# Patient Record
Sex: Female | Born: 1950 | Race: White | Hispanic: No | Marital: Married | Smoking: Never smoker
Health system: Southern US, Community
[De-identification: ages and names within clinical notes are randomized; demographics above are authoritative.]

## PROBLEM LIST (undated history)

## (undated) DIAGNOSIS — F329 Major depressive disorder, single episode, unspecified: Secondary | ICD-10-CM

## (undated) DIAGNOSIS — T7840XA Allergy, unspecified, initial encounter: Secondary | ICD-10-CM

## (undated) DIAGNOSIS — F418 Other specified anxiety disorders: Secondary | ICD-10-CM

## (undated) DIAGNOSIS — F411 Generalized anxiety disorder: Secondary | ICD-10-CM

## (undated) DIAGNOSIS — S02609A Fracture of mandible, unspecified, initial encounter for closed fracture: Secondary | ICD-10-CM

## (undated) DIAGNOSIS — Z0000 Encounter for general adult medical examination without abnormal findings: Secondary | ICD-10-CM

## (undated) DIAGNOSIS — I10 Essential (primary) hypertension: Secondary | ICD-10-CM

## (undated) DIAGNOSIS — D869 Sarcoidosis, unspecified: Secondary | ICD-10-CM

## (undated) DIAGNOSIS — F419 Anxiety disorder, unspecified: Secondary | ICD-10-CM

## (undated) DIAGNOSIS — F32A Depression, unspecified: Secondary | ICD-10-CM

## (undated) HISTORY — DX: Fracture of mandible, unspecified, initial encounter for closed fracture: S02.609A

## (undated) HISTORY — DX: Generalized anxiety disorder: F41.1

## (undated) HISTORY — DX: Allergy, unspecified, initial encounter: T78.40XA

## (undated) HISTORY — PX: ABDOMINAL HYSTERECTOMY: SHX81

## (undated) HISTORY — DX: Depression, unspecified: F32.A

## (undated) HISTORY — PX: FRACTURE SURGERY: SHX138

## (undated) HISTORY — DX: Sarcoidosis, unspecified: D86.9

## (undated) HISTORY — DX: Encounter for general adult medical examination without abnormal findings: Z00.00

## (undated) HISTORY — DX: Essential (primary) hypertension: I10

## (undated) HISTORY — DX: Anxiety disorder, unspecified: F41.9

## (undated) HISTORY — DX: Major depressive disorder, single episode, unspecified: F32.9

## (undated) HISTORY — DX: Other specified anxiety disorders: F41.8

---

## 1979-02-09 HISTORY — PX: ECTOPIC PREGNANCY SURGERY: SHX613

## 2005-10-26 ENCOUNTER — Encounter: Admission: RE | Admit: 2005-10-26 | Discharge: 2005-10-26 | Payer: Self-pay | Admitting: Unknown Physician Specialty

## 2006-10-11 ENCOUNTER — Encounter: Admission: RE | Admit: 2006-10-11 | Discharge: 2006-10-11 | Payer: Self-pay | Admitting: Unknown Physician Specialty

## 2008-08-30 ENCOUNTER — Encounter: Admission: RE | Admit: 2008-08-30 | Discharge: 2008-08-30 | Payer: Self-pay | Admitting: Unknown Physician Specialty

## 2009-09-03 ENCOUNTER — Encounter: Admission: RE | Admit: 2009-09-03 | Discharge: 2009-09-03 | Payer: Self-pay | Admitting: Unknown Physician Specialty

## 2009-09-09 ENCOUNTER — Encounter: Admission: RE | Admit: 2009-09-09 | Discharge: 2009-09-09 | Payer: Self-pay | Admitting: Unknown Physician Specialty

## 2010-03-01 ENCOUNTER — Encounter: Payer: Self-pay | Admitting: Unknown Physician Specialty

## 2010-09-01 ENCOUNTER — Other Ambulatory Visit (HOSPITAL_BASED_OUTPATIENT_CLINIC_OR_DEPARTMENT_OTHER): Payer: Self-pay | Admitting: Unknown Physician Specialty

## 2010-09-01 DIAGNOSIS — Z1231 Encounter for screening mammogram for malignant neoplasm of breast: Secondary | ICD-10-CM

## 2010-09-07 ENCOUNTER — Ambulatory Visit (HOSPITAL_BASED_OUTPATIENT_CLINIC_OR_DEPARTMENT_OTHER)
Admission: RE | Admit: 2010-09-07 | Discharge: 2010-09-07 | Disposition: A | Discharge status: Home / Self Care | Payer: BC Managed Care – PPO | Source: Ambulatory Visit | Attending: Unknown Physician Specialty | Admitting: Unknown Physician Specialty

## 2010-09-07 DIAGNOSIS — Z1231 Encounter for screening mammogram for malignant neoplasm of breast: Secondary | ICD-10-CM | POA: Insufficient documentation

## 2010-10-05 ENCOUNTER — Encounter (HOSPITAL_BASED_OUTPATIENT_CLINIC_OR_DEPARTMENT_OTHER): Payer: Self-pay

## 2010-10-05 ENCOUNTER — Ambulatory Visit (HOSPITAL_BASED_OUTPATIENT_CLINIC_OR_DEPARTMENT_OTHER)
Admission: RE | Admit: 2010-10-05 | Discharge: 2010-10-05 | Disposition: A | Discharge status: Home / Self Care | Payer: BC Managed Care – PPO | Source: Ambulatory Visit | Attending: Family | Admitting: Family

## 2010-10-05 ENCOUNTER — Ambulatory Visit (INDEPENDENT_AMBULATORY_CARE_PROVIDER_SITE_OTHER): Payer: BC Managed Care – PPO | Admitting: Family

## 2010-10-05 ENCOUNTER — Encounter: Payer: Self-pay | Admitting: Family

## 2010-10-05 VITALS — BP 138/88 | HR 66 | Temp 98.2°F | Resp 16 | Ht 64.0 in | Wt 164.1 lb

## 2010-10-05 DIAGNOSIS — K5730 Diverticulosis of large intestine without perforation or abscess without bleeding: Secondary | ICD-10-CM | POA: Insufficient documentation

## 2010-10-05 DIAGNOSIS — R109 Unspecified abdominal pain: Secondary | ICD-10-CM | POA: Insufficient documentation

## 2010-10-05 DIAGNOSIS — R197 Diarrhea, unspecified: Secondary | ICD-10-CM | POA: Insufficient documentation

## 2010-10-05 DIAGNOSIS — K449 Diaphragmatic hernia without obstruction or gangrene: Secondary | ICD-10-CM | POA: Insufficient documentation

## 2010-10-05 LAB — CBC WITH DIFFERENTIAL/PLATELET
Basophils Relative: 1 % (ref 0–1)
Eosinophils Absolute: 1.5 10*3/uL — ABNORMAL HIGH (ref 0.0–0.7)
Eosinophils Relative: 15 % — ABNORMAL HIGH (ref 0–5)
Lymphocytes Relative: 20 % (ref 12–46)
MCH: 32.1 pg (ref 26.0–34.0)
MCHC: 32.6 g/dL (ref 30.0–36.0)
MCV: 98.2 fL (ref 78.0–100.0)
Neutrophils Relative %: 60 % (ref 43–77)
Platelets: 205 10*3/uL (ref 150–400)
RBC: 4.46 MIL/uL (ref 3.87–5.11)
RDW: 13 % (ref 11.5–15.5)
WBC: 10.1 10*3/uL (ref 4.0–10.5)

## 2010-10-05 LAB — HEPATIC FUNCTION PANEL
AST: 27 U/L (ref 0–37)
Bilirubin, Direct: 0.1 mg/dL (ref 0.0–0.3)
Indirect Bilirubin: 0.4 mg/dL (ref 0.0–0.9)

## 2010-10-05 MED ORDER — DIPHENOXYLATE-ATROPINE 2.5-0.025 MG PO TABS: 1.0000 | ORAL_TABLET | Freq: Four times a day (QID) | ORAL | Status: DC | PRN

## 2010-10-05 MED ORDER — IOHEXOL 300 MG/ML  SOLN
100.0000 mL | Freq: Once | INTRAMUSCULAR | Status: AC | PRN
  Administered 2010-10-05: 100 mL via INTRAVENOUS

## 2010-10-05 NOTE — Progress Notes (Signed)
Subjective:    Patient ID: Casey Rowe, female    DOB: [birth year], 60 y.o.   MRN: [id number]  HPI  Casey Rowe is a 60 yr old female who presents today with chief complaint of diarrhea. Started in July- stopped dairy without improvement.  Symptoms continued to worsen.  + Associated bloating, often wakes her up at night.  Started probiotic, yogurt, no improvement.  Yesterday had 12 episodes of diarrhea yesterday. Diarrhea is associated with abdominal cramping.  Denies fever. Denies black or bloody stools.  Denies history of antibiotic use. She reports that she had appendicitis 8 yrs ago, she declined removal of appendix at that time. Denies recent travel.    She sees the Urgent care ([facility] Wishes to establish with a primary care.    Review of Systems  Constitutional: Negative for fever.  HENT: Negative for congestion.   Respiratory: Negative for cough and shortness of breath.   Cardiovascular: Negative for chest pain.  Gastrointestinal: Positive for nausea.  Genitourinary: Negative for dysuria and urgency.  Musculoskeletal: Negative for myalgias and arthralgias.  Skin: Negative for rash.  Neurological: Negative for headaches.  Hematological: Negative for adenopathy.  Psychiatric/Behavioral:       Denies concerns about depression/anxiety   Past Medical History  Diagnosis Date  . Broken jaw 1980s  . Tubal pregnancy 1981  . Hypertension   . Sarcoidosis     History   Social History  . Marital Status: Married    Spouse Name: N/A    Number of Children: N/A  . Years of Education: N/A   Occupational History  . Not on file.   Social History Main Topics  . Smoking status: Never Smoker   . Smokeless tobacco: Never Used  . Alcohol Use: Yes  . Drug Use: No  . Sexually Active: Not on file   Other Topics Concern  . 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  Fracture surgery 1980s    broken jaw  . Ectopic pregnancy surgery 1981    Family History  Problem Relation Age of Onset  . Hypertension Mother   . Dementia Mother   . Diabetes Mother   . Diabetes Father   . Heart attack Father   . Heart disease Father   . Cancer Maternal Aunt     breast    Allergies  Allergen Reactions  . Demerol Nausea And Vomiting    No current outpatient prescriptions on file prior to visit.   No current facility-administered medications on file prior to visit.    BP 138/88  Pulse 66  Temp(Src) 98.2 F (36.8 C) (Oral)  Resp 16  Ht 5\' 4"  (1.626 m)  Wt 164 lb 1.3 oz (74.426 kg)  BMI 28.16 kg/m2  LMP 02/08/1997        Objective:   Physical Exam  Constitutional: She is oriented to person, place, and time. She appears well-developed and well-nourished.  HENT:  Head: Normocephalic and atraumatic.  Eyes: Conjunctivae are normal. No scleral icterus.  Neck: Neck supple.  Cardiovascular: Normal rate and regular rhythm.   Pulmonary/Chest: Effort normal and breath sounds normal. No respiratory distress. She has no wheezes. She has no rales. She exhibits no tenderness.  Abdominal: Soft. Bowel sounds are normal. She exhibits no distension and no mass. There is no rebound and no guarding.  Mild RLQ and LLQ tenderness without guarding  Neurological: She is alert and oriented to person, place, and time.  Skin: Skin is warm and dry.  Psychiatric: She has a normal mood and affect. Her behavior is normal. Judgment and thought content normal.          Assessment & Plan:

## 2010-10-05 NOTE — Patient Instructions (Signed)
Drink plenty of fluids. Complete your CT and lab work on the first floor.  Follow up in 1 week, call if you develop worsening abdominal pain, nausea, vomitting, fever, or blood in the stool.  Welcome to [facility]!

## 2010-10-06 ENCOUNTER — Telehealth: Payer: Self-pay | Admitting: *Deleted

## 2010-10-06 ENCOUNTER — Other Ambulatory Visit: Payer: Self-pay | Admitting: Family

## 2010-10-06 DIAGNOSIS — R197 Diarrhea, unspecified: Secondary | ICD-10-CM

## 2010-10-06 LAB — BASIC METABOLIC PANEL WITH GFR
BUN: 7 mg/dL (ref 6–23)
Calcium: 9.7 mg/dL (ref 8.4–10.5)
Creat: 0.74 mg/dL (ref 0.50–1.10)
GFR, Est African American: >60 mL/min (ref >60)
GFR, Est Non African American: >60 mL/min (ref >60)

## 2010-10-06 MED ORDER — METRONIDAZOLE 500 MG PO TABS: 500.0000 mg | ORAL_TABLET | Freq: Three times a day (TID) | ORAL | Status: AC

## 2010-10-06 MED ORDER — CIPROFLOXACIN HCL 500 MG PO TABS: 500.0000 mg | ORAL_TABLET | Freq: Two times a day (BID) | ORAL | Status: AC

## 2010-10-06 NOTE — Assessment & Plan Note (Signed)
CT performed notes- Mild nonspecific pelvic edema and trace cul-de-sac fluid. Given concurrent sigmoid diverticulosis, question mild uncomplicated diverticulitis. Lab work is unremarkable.  Will plan to add empiric cipro/flagyl, complete stool studies and refer to GI.  See phone note 8/28.  Immodium PRN.

## 2010-10-06 NOTE — Telephone Encounter (Signed)
Received call from pt that she had a bad night. Tried to eat some chicken last night and was very sick. She completed the CT and stool cultures. Pt upset; "feels like we were just treating her symptoms instead of the problem." Feels that the anti-diarrheal is not helping her problems. Advised pt that the CT and stool cultures were ordered to help determine the cause of her symptoms so we will know how and what we are treating. Advised pt we would call her back with results and directions. 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 Cell) [phone].  Please advise.

## 2010-10-06 NOTE — Telephone Encounter (Signed)
Spoke with pt reviewed results of the CT and plans to treat empirically with cipro and flagyl.  Await stool studies, will also refer to GI.  Pt encouraged to stay well hydrated and let us know how she feels in the next 1-2 days.  She verbalizes understanding.

## 2010-10-07 LAB — CLOSTRIDIUM DIFFICILE BY PCR: Toxigenic C. Difficile by PCR: NOT DETECTED

## 2010-10-08 ENCOUNTER — Ambulatory Visit: Payer: BC Managed Care – PPO | Admitting: Internal Medicine

## 2010-10-10 LAB — STOOL CULTURE

## 2010-10-12 ENCOUNTER — Encounter: Payer: Self-pay | Admitting: Family

## 2011-01-16 ENCOUNTER — Encounter: Payer: Self-pay | Admitting: Family Medicine

## 2011-01-16 ENCOUNTER — Ambulatory Visit (INDEPENDENT_AMBULATORY_CARE_PROVIDER_SITE_OTHER): Payer: BC Managed Care – PPO | Admitting: Family Medicine

## 2011-01-16 VITALS — BP 142/88 | HR 81 | Temp 98.0°F | Wt 166.0 lb

## 2011-01-16 DIAGNOSIS — J309 Allergic rhinitis, unspecified: Secondary | ICD-10-CM

## 2011-01-16 MED ORDER — AMOXICILLIN-POT CLAVULANATE 875-125 MG PO TABS: 1.0000 | ORAL_TABLET | Freq: Two times a day (BID) | ORAL | Status: AC

## 2011-01-16 MED ORDER — METHYLPREDNISOLONE ACETATE PF 40 MG/ML IJ SUSP
40.0000 mg | Freq: Once | INTRAMUSCULAR | Status: AC
  Administered 2011-01-16: 40 mg via INTRAMUSCULAR

## 2011-01-16 MED ORDER — FLUTICASONE PROPIONATE 50 MCG/ACT NA SUSP: 2.0000 | Freq: Every day | NASAL | Status: DC

## 2011-01-16 MED ORDER — FLUTICASONE FUROATE 27.5 MCG/SPRAY NA SUSP: NASAL | Status: DC

## 2011-01-16 NOTE — Progress Notes (Signed)
OFFICE NOTE  01/18/2011  CC:  Chief Complaint  Patient presents with  . Sinusitis    x 2 mth, worse this week     HPI:   Patient is a 60 y.o. Caucasian female who is here for respiratory complaints. "Head cold from hell".  Two months of waxing/waning nasal congestion/facial fullness, PND, feels puffy/hot in paranasal sinus areas.  NO cough.  NO fever. +HA.  Sticky, yellow mucous.  Feels like she's worsening and is afraid it is going to "go to her chest like last time" and cause bronchitis sx's.  Pertinent PMH:  HTN Depression  MEDS;   Outpatient Prescriptions Prior to Visit  Medication Sig Dispense Refill  . bisoprolol (ZEBETA) 5 MG tablet Take 5 mg by mouth daily.        . BuPROPion HCl (WELLBUTRIN XL PO) Take by mouth.        . Est Estrogens-Methyltest (ESTRATEST PO) Take by mouth.        . losartan (COZAAR) 50 MG tablet Take 50 mg by mouth 2 (two) times daily.        Marland Kitchen lactase (LACTAID) 3000 UNITS tablet Take 1 tablet by mouth 3 (three) times daily with meals.        . Loperamide HCl (ANTI-DIARRHEAL PO) Take by mouth 3 (three) times daily as needed.        . Probiotic Product (PROBIOTIC FORMULA) CAPS Take 1 capsule by mouth daily.        . Simethicone 125 MG TABS Take by mouth as needed.          PE: Blood pressure 142/88, pulse 81, temperature 98 F (36.7 C), temperature source Oral, weight 166 lb (75.297 kg), last menstrual period 02/08/1997, SpO2 97.00%. VS: noted--normal. Gen: alert, NAD, NONTOXIC APPEARING. HEENT: eyes without injection, drainage, or swelling.  Ears: EACs clear, TMs with normal light reflex and landmarks.  Nose: Clear rhinorrhea, with some dried, crusty exudate adherent to mildly injected mucosa.  No purulent d/c.  No paranasal sinus TTP.  No facial swelling.  Throat and mouth without focal lesion.  No pharyngial swelling, erythema, or exudate.   Neck: supple, no LAD.   LUNGS: CTA bilat, nonlabored resps.   CV: RRR, no m/r/g. EXT: no c/c/e SKIN:  no rash  IMPRESSION AND PLAN:  Allergic sinusitis + infectious component as well. No sign of RAD at this point but pt fearful it is about to "go there". Depo Medrol 40mg  IM given today x 1. Flonase 2 sprays each nostril once daily.  Continue saline nasal irrigation. Augmentin 875mg  bid x 10d.     FOLLOW UP:  Return if symptoms worsen or fail to improve.

## 2011-01-16 NOTE — Patient Instructions (Signed)
Try OTC nonsedating antihistamine like generic allegra OR generic claritin OR generic zyrtec and take as directed. Avoid decongestants--these may elevate your blood pressure.

## 2011-01-18 DIAGNOSIS — J329 Chronic sinusitis, unspecified: Secondary | ICD-10-CM | POA: Insufficient documentation

## 2011-01-18 HISTORY — DX: Chronic sinusitis, unspecified: J32.9

## 2011-01-18 NOTE — Assessment & Plan Note (Signed)
+   infectious component as well. No sign of RAD at this point but pt fearful it is about to "go there". Depo Medrol 40mg  IM given today x 1. Flonase 2 sprays each nostril once daily.  Continue saline nasal irrigation. Augmentin 875mg  bid x 10d.

## 2011-02-04 ENCOUNTER — Encounter: Payer: Self-pay | Admitting: Internal Medicine

## 2011-02-04 ENCOUNTER — Ambulatory Visit (INDEPENDENT_AMBULATORY_CARE_PROVIDER_SITE_OTHER): Payer: BC Managed Care – PPO | Admitting: Internal Medicine

## 2011-02-04 DIAGNOSIS — I10 Essential (primary) hypertension: Secondary | ICD-10-CM

## 2011-02-04 DIAGNOSIS — F411 Generalized anxiety disorder: Secondary | ICD-10-CM | POA: Insufficient documentation

## 2011-02-04 DIAGNOSIS — F418 Other specified anxiety disorders: Secondary | ICD-10-CM | POA: Insufficient documentation

## 2011-02-04 DIAGNOSIS — D869 Sarcoidosis, unspecified: Secondary | ICD-10-CM | POA: Insufficient documentation

## 2011-02-04 DIAGNOSIS — F419 Anxiety disorder, unspecified: Secondary | ICD-10-CM

## 2011-02-04 DIAGNOSIS — R079 Chest pain, unspecified: Secondary | ICD-10-CM

## 2011-02-04 DIAGNOSIS — T7840XA Allergy, unspecified, initial encounter: Secondary | ICD-10-CM | POA: Insufficient documentation

## 2011-02-04 DIAGNOSIS — J45909 Unspecified asthma, uncomplicated: Secondary | ICD-10-CM | POA: Insufficient documentation

## 2011-02-04 MED ORDER — LOSARTAN POTASSIUM 50 MG PO TABS: 50.0000 mg | ORAL_TABLET | Freq: Two times a day (BID) | ORAL | Status: DC

## 2011-02-04 MED ORDER — BISOPROLOL FUMARATE 5 MG PO TABS: 5.0000 mg | ORAL_TABLET | Freq: Every day | ORAL | Status: DC

## 2011-02-04 MED ORDER — BUPROPION HCL ER (XL) 300 MG PO TB24: 300.0000 mg | ORAL_TABLET | Freq: Every day | ORAL | Status: DC

## 2011-02-04 MED ORDER — LORAZEPAM 0.5 MG PO TABS: 0.5000 mg | ORAL_TABLET | Freq: Two times a day (BID) | ORAL | Status: AC | PRN

## 2011-02-04 NOTE — Patient Instructions (Signed)
Take Ativan twice a day as needed.  May use at night if cannot sleep  See me in early January     Keep appt with cardioloigst tomorrow  Dr. Emelda Brothers cardiology  Labs will be mailed to you
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
## 2011-02-05 ENCOUNTER — Encounter: Payer: Self-pay | Admitting: Emergency Medicine

## 2011-02-05 LAB — TSH: TSH: 0.926 u[IU]/mL (ref 0.350–4.500)

## 2011-02-05 LAB — COMPREHENSIVE METABOLIC PANEL
ALT: 19 U/L (ref 0–35)
AST: 17 U/L (ref 0–37)
Albumin: 4.6 g/dL (ref 3.5–5.2)
CO2: 25 mEq/L (ref 19–32)
Calcium: 9.7 mg/dL (ref 8.4–10.5)
Chloride: 104 mEq/L (ref 96–112)
Creat: 0.66 mg/dL (ref 0.50–1.10)
Potassium: 4.3 mEq/L (ref 3.5–5.3)
Sodium: 138 mEq/L (ref 135–145)
Total Protein: 6.8 g/dL (ref 6.0–8.3)

## 2011-02-05 LAB — CBC WITH DIFFERENTIAL/PLATELET
Eosinophils Relative: 3 % (ref 0–5)
Lymphocytes Relative: 30 % (ref 12–46)
Lymphs Abs: 1.6 10*3/uL (ref 0.7–4.0)
MCV: 100 fL (ref 78.0–100.0)
Neutro Abs: 3 10*3/uL (ref 1.7–7.7)
Platelets: 219 10*3/uL (ref 150–400)
RBC: 4.57 MIL/uL (ref 3.87–5.11)
WBC: 5.2 10*3/uL (ref 4.0–10.5)

## 2011-02-05 LAB — LIPID PANEL: LDL Cholesterol: 130 mg/dL — ABNORMAL HIGH (ref 0–99)

## 2011-02-10 ENCOUNTER — Ambulatory Visit (INDEPENDENT_AMBULATORY_CARE_PROVIDER_SITE_OTHER): Payer: BC Managed Care – PPO | Admitting: Internal Medicine

## 2011-02-10 ENCOUNTER — Encounter: Payer: Self-pay | Admitting: Internal Medicine

## 2011-02-10 VITALS — BP 139/84 | HR 76 | Temp 97.2°F | Ht 64.5 in | Wt 160.0 lb

## 2011-02-10 DIAGNOSIS — F419 Anxiety disorder, unspecified: Secondary | ICD-10-CM

## 2011-02-10 DIAGNOSIS — I10 Essential (primary) hypertension: Secondary | ICD-10-CM

## 2011-02-10 DIAGNOSIS — R0789 Other chest pain: Secondary | ICD-10-CM | POA: Insufficient documentation

## 2011-02-10 DIAGNOSIS — F411 Generalized anxiety disorder: Secondary | ICD-10-CM

## 2011-02-10 NOTE — Progress Notes (Signed)
Subjective:    Patient ID: Casey Rowe, female    DOB: [birth year], 62 y.o.   MRN: [id number]  HPI Jaimey returns for follow up.  She is slightly improved but is afraid to take her Ativan.  She has only take 3 times.  She reports she does feel better during the day when she takes it.  She has a stress ECHO for tomorrow.  She returns to school on Thursday and has quite a bit of anxiety about returning to the classroom  She also reports her 2 yo grandson has a brain tumor.   Lives in [place] and she feels bad that she cannot get to her daughter  Allergies  Allergen Reactions  . Demerol Nausea And Vomiting   Past Medical History  Diagnosis Date  . Broken jaw 1980s  . Tubal pregnancy 1981  . Sarcoidosis   . Anxiety   . Depression   . Hypertension   . Asthma   . Allergy    Past Surgical History  Procedure Date  . Fracture surgery 1980s    broken jaw  . Ectopic pregnancy surgery 1981  . Cesarean section   . Abdominal hysterectomy    History   Social History  . Marital Status: Married    Spouse Name: N/A    Number of Children: N/A  . Years of Education: N/A   Occupational History  . Not on file.   Social History Main Topics  . Smoking status: Never Smoker   . Smokeless tobacco: Never Used  . Alcohol Use: Yes  . Drug Use: No  . Sexually Active: Not on file   Other Topics Concern  . 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 Hypertension Mother   . Dementia Mother   . Diabetes Mother   . Diabetes Father   . Heart attack Father   . Heart disease Father   . Psoriasis Father   . Cancer Maternal Aunt     breast   Patient Active Problem List  Diagnoses  . Diarrhea  . Allergic sinusitis  . Anxiety  . Depression  . Hypertension  . Asthma  . Sarcoidosis  . Allergy   Current Outpatient Prescriptions on File Prior to Visit  Medication Sig  Dispense Refill  . bisoprolol (ZEBETA) 5 MG tablet Take 1 tablet (5 mg total) by mouth daily.  90 tablet  1  . buPROPion (WELLBUTRIN XL) 300 MG 24 hr tablet Take 1 tablet (300 mg total) by mouth daily.  90 tablet  1  . estrogen-methylTESTOSTERone (ESTRATEST) 1.25-2.5 MG per tablet Take 1 tablet by mouth daily.        . fluticasone (FLONASE) 50 MCG/ACT nasal spray Place 2 sprays into the nose daily.  16 g  5  . LORazepam (ATIVAN) 0.5 MG tablet Take 1 tablet (0.5 mg total) by mouth 2 (two) times daily as needed for anxiety.  30 tablet  1  . losartan (COZAAR) 50 MG tablet Take 1 tablet (50 mg total) by mouth 2 (two) times daily.  180 tablet  0  . losartan (COZAAR) 50 MG tablet Take 1 tablet (50 mg total) by mouth 2 (two) times daily.  180 tablet  0       Review of Systems    see HPI Objective:   Physical Exam  Physical Exam  Nursing note and vitals  reviewed.  Constitutional: She is oriented to person, place, and time. She appears well-developed and well-nourished.  HENT:  Head: Normocephalic and atraumatic.  Cardiovascular: Normal rate and regular rhythm. Exam reveals no gallop and no friction rub.  No murmur heard.  Pulmonary/Chest: Breath sounds normal. She has no wheezes. She has no rales.  Neurological: She is alert and oriented to person, place, and time.  Skin: Skin is warm and dry.  Psychiatric: She has a normal mood and affect. Her behavior is normal.      Assessment & Plan:  1)   Anxiety  Again reinforced to take her medicine bid and before 1:30 pm at night.  She voices understanding.  I gave her the phone number for 3 different psychiatrists and pt voices that she will make appt. With one  2)  Atypical  Chest pressure:  Work up pending.    See me in 4 weeks or sooner prn

## 2011-02-10 NOTE — Patient Instructions (Signed)
Take anxiety medicine twice a day   See me in 4 weeks

## 2011-02-22 ENCOUNTER — Ambulatory Visit (INDEPENDENT_AMBULATORY_CARE_PROVIDER_SITE_OTHER): Payer: BC Managed Care – PPO | Admitting: Emergency Medicine

## 2011-02-22 VITALS — BP 141/87 | HR 69 | Temp 97.1°F | Resp 12 | Wt 162.0 lb

## 2011-02-22 DIAGNOSIS — Z23 Encounter for immunization: Secondary | ICD-10-CM

## 2011-05-12 ENCOUNTER — Encounter: Payer: Self-pay | Admitting: Internal Medicine

## 2011-05-12 ENCOUNTER — Ambulatory Visit (INDEPENDENT_AMBULATORY_CARE_PROVIDER_SITE_OTHER): Payer: BC Managed Care – PPO | Admitting: Internal Medicine

## 2011-05-12 DIAGNOSIS — J9801 Acute bronchospasm: Secondary | ICD-10-CM

## 2011-05-12 DIAGNOSIS — R05 Cough: Secondary | ICD-10-CM

## 2011-05-12 DIAGNOSIS — J45909 Unspecified asthma, uncomplicated: Secondary | ICD-10-CM

## 2011-05-12 MED ORDER — METHYLPREDNISOLONE ACETATE 80 MG/ML IJ SUSP
120.0000 mg | Freq: Once | INTRAMUSCULAR | Status: AC
  Administered 2011-05-12: 120 mg via INTRAMUSCULAR

## 2011-05-12 MED ORDER — ALBUTEROL SULFATE HFA 108 (90 BASE) MCG/ACT IN AERS: 2.0000 | INHALATION_SPRAY | Freq: Four times a day (QID) | RESPIRATORY_TRACT | Status: DC | PRN

## 2011-05-12 MED ORDER — FLUTICASONE-SALMETEROL 250-50 MCG/DOSE IN AEPB: INHALATION_SPRAY | RESPIRATORY_TRACT | Status: DC

## 2011-05-12 MED ORDER — PREDNISONE 20 MG PO TABS: ORAL_TABLET | ORAL | Status: DC

## 2011-05-12 NOTE — Patient Instructions (Signed)
See me in one week   Take meds as prescribed

## 2011-05-12 NOTE — Progress Notes (Signed)
Subjective:    Patient ID: Casey Rowe, female    DOB: April 28, 1950, 61 y.o.   MRN: [id number]  HPI  Casey Rowe is here for acute visit  She has been wheezing last several days and using her home nebulizer frequently.  She has not had her Qvar in a while.  Lots of coughing.  Started Zyrtec for her allergies.    Allergies  Allergen Reactions  . Demerol Nausea And Vomiting   Past Medical History  Diagnosis Date  . Broken jaw 1980s  . Tubal pregnancy 1981  . Sarcoidosis   . Anxiety   . Depression   . Hypertension   . Asthma   . Allergy    Past Surgical History  Procedure Date  . Fracture surgery 1980s    broken jaw  . Ectopic pregnancy surgery 1981  . Cesarean section   . Abdominal hysterectomy    History   Social History  . Marital Status: Married    Spouse Name: N/A    Number of Children: N/A  . Years of Education: N/A   Occupational History  . Not on file.   Social History Main Topics  . Smoking status: Never Smoker   . Smokeless tobacco: Never Used  . Alcohol Use: Yes  . Drug Use: No  . Sexually Active: Not on file   Other Topics Concern  . 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 Hypertension Mother   . Dementia Mother   . Diabetes Mother   . Diabetes Father   . Heart attack Father   . Heart disease Father   . Psoriasis Father   . Cancer Maternal Aunt     breast   Patient Active Problem List  Diagnoses  . Diarrhea  . Allergic sinusitis  . Anxiety  . Depression  . Hypertension  . Asthma  . Sarcoidosis  . Allergy  . Atypical chest pain   Current Outpatient Prescriptions on File Prior to Visit  Medication Sig Dispense Refill  . beclomethasone (QVAR) 80 MCG/ACT inhaler Inhale 1 puff into the lungs 2 (two) times daily.      . bisoprolol (ZEBETA) 5 MG tablet Take 1 tablet (5 mg total) by mouth daily.  90 tablet  1  . buPROPion  (WELLBUTRIN XL) 300 MG 24 hr tablet Take 1 tablet (300 mg total) by mouth daily.  90 tablet  1  . estrogen-methylTESTOSTERone (ESTRATEST) 1.25-2.5 MG per tablet Take 1 tablet by mouth daily.        . fluticasone (FLONASE) 50 MCG/ACT nasal spray Place 2 sprays into the nose daily.  16 g  5  . losartan (COZAAR) 50 MG tablet Take 1 tablet (50 mg total) by mouth 2 (two) times daily.  180 tablet  0  . albuterol (PROVENTIL HFA;VENTOLIN HFA) 108 (90 BASE) MCG/ACT inhaler Inhale 2 puffs into the lungs every 6 (six) hours as needed for wheezing.  1 Inhaler  0  . Fluticasone-Salmeterol (ADVAIR DISKUS) 250-50 MCG/DOSE AEPB 1 puff into the lungs twice a day  60 each  0  . DISCONTD: losartan (COZAAR) 50 MG tablet Take 1 tablet (50 mg total) by mouth 2 (two) times daily.  180 tablet  0   No current facility-administered medications on file prior to visit.      Review of Systems See HPI    Objective:  Physical Exam  Physical Exam  Nursing note and vitals reviewed.  Constitutional: She is oriented to person, place, and time. She appears well-developed and well-nourished.  HENT:  Head: Normocephalic and atraumatic.  Cardiovascular: Normal rate and regular rhythm. Exam reveals no gallop and no friction rub.  No murmur heard.  Pulmonary/Chest: Breath sounds normal. She has end exp wheezing. She has no rales.  Neurological: She is alert and oriented to person, place, and time.  Skin: Skin is warm and dry.  Psychiatric: She has a normal mood and affect. Her behavior is normal.        Assessment & Plan:  1) asthma exacerbation.  Will give depo-medrol 120 mg in office and prednisone 60 mg taper by 20 mg q 3days.  Add advair 200/50 and recheck in one week.  Ok to switch to albuterol MDI for rescue only.   2) cough see above.  3)  Bronchospasm

## 2011-08-07 ENCOUNTER — Other Ambulatory Visit: Payer: Self-pay | Admitting: Internal Medicine

## 2011-08-20 ENCOUNTER — Other Ambulatory Visit: Payer: Self-pay | Admitting: Internal Medicine

## 2011-08-30 ENCOUNTER — Other Ambulatory Visit (HOSPITAL_BASED_OUTPATIENT_CLINIC_OR_DEPARTMENT_OTHER): Payer: Self-pay | Admitting: Unknown Physician Specialty

## 2011-08-30 DIAGNOSIS — Z1231 Encounter for screening mammogram for malignant neoplasm of breast: Secondary | ICD-10-CM

## 2011-09-02 ENCOUNTER — Encounter: Payer: Self-pay | Admitting: Internal Medicine

## 2011-09-02 ENCOUNTER — Ambulatory Visit (INDEPENDENT_AMBULATORY_CARE_PROVIDER_SITE_OTHER): Payer: BC Managed Care – PPO | Admitting: Internal Medicine

## 2011-09-02 VITALS — BP 134/80 | HR 66 | Temp 97.0°F | Resp 16 | Ht 64.5 in | Wt 159.0 lb

## 2011-09-02 DIAGNOSIS — F329 Major depressive disorder, single episode, unspecified: Secondary | ICD-10-CM

## 2011-09-02 DIAGNOSIS — F3289 Other specified depressive episodes: Secondary | ICD-10-CM

## 2011-09-02 DIAGNOSIS — F419 Anxiety disorder, unspecified: Secondary | ICD-10-CM

## 2011-09-02 DIAGNOSIS — F411 Generalized anxiety disorder: Secondary | ICD-10-CM

## 2011-09-02 DIAGNOSIS — I10 Essential (primary) hypertension: Secondary | ICD-10-CM

## 2011-09-02 DIAGNOSIS — E785 Hyperlipidemia, unspecified: Secondary | ICD-10-CM

## 2011-09-02 LAB — COMPREHENSIVE METABOLIC PANEL
Albumin: 4.2 g/dL (ref 3.5–5.2)
Alkaline Phosphatase: 36 U/L — ABNORMAL LOW (ref 39–117)
BUN: 12 mg/dL (ref 6–23)
CO2: 28 mEq/L (ref 19–32)
Glucose, Bld: 72 mg/dL (ref 70–99)
Total Bilirubin: 0.5 mg/dL (ref 0.3–1.2)
Total Protein: 6.6 g/dL (ref 6.0–8.3)

## 2011-09-02 LAB — LIPID PANEL
Cholesterol: 190 mg/dL (ref 0–200)
Total CHOL/HDL Ratio: 5.6 Ratio
VLDL: 49 mg/dL — ABNORMAL HIGH (ref 0–40)

## 2011-09-02 MED ORDER — LOSARTAN POTASSIUM 50 MG PO TABS: 50.0000 mg | ORAL_TABLET | Freq: Two times a day (BID) | ORAL | Status: DC

## 2011-09-02 MED ORDER — BISOPROLOL FUMARATE 5 MG PO TABS: 5.0000 mg | ORAL_TABLET | Freq: Every day | ORAL | Status: DC

## 2011-09-02 NOTE — Progress Notes (Signed)
Subjective:    Patient ID: Casey Rowe, female    DOB: 1950-07-30, 61 y.o.   MRN: [id number]  HPI Casey Rowe is here for follow up.  She is smiling, doing much better.  She is seeing a psychiatrist Dr. Cora Collum and therapist Colen Darling.  Uses Xanax most evenings.  Breathing better no wheezing.    She is fasting and would like her lipids rechecked  No futher epoisodes of chest pain  Allergies  Allergen Reactions  . Demerol Nausea And Vomiting   Past Medical History  Diagnosis Date  . Broken jaw 1980s  . Tubal pregnancy 1981  . Sarcoidosis   . Anxiety   . Depression   . Hypertension   . Asthma   . Allergy    Past Surgical History  Procedure Date  . Fracture surgery 1980s    broken jaw  . Ectopic pregnancy surgery 1981  . Cesarean section   . Abdominal hysterectomy    History   Social History  . Marital Status: Married    Spouse Name: N/A    Number of Children: N/A  . Years of Education: N/A   Occupational History  . Not on file.   Social History Main Topics  . Smoking status: Never Smoker   . Smokeless tobacco: Never Used  . Alcohol Use: Yes  . Drug Use: No  . Sexually Active: Not on file   Other Topics Concern  . 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 Hypertension Mother   . Dementia Mother   . Diabetes Mother   . Diabetes Father   . Heart attack Father   . Heart disease Father   . Psoriasis Father   . Cancer Maternal Aunt     breast   Patient Active Problem List  Diagnosis  . Diarrhea  . Allergic sinusitis  . Anxiety  . Depression  . Hypertension  . Asthma  . Sarcoidosis  . Allergy  . Atypical chest pain   Current Outpatient Prescriptions on File Prior to Visit  Medication Sig Dispense Refill  . albuterol (PROVENTIL HFA;VENTOLIN HFA) 108 (90 BASE) MCG/ACT inhaler Inhale 2 puffs into the lungs every 6 (six) hours  as needed for wheezing.  1 Inhaler  0  . ALPRAZolam (XANAX) 0.25 MG tablet Take 0.25 mg by mouth 3 (three) times daily as needed.      . beclomethasone (QVAR) 80 MCG/ACT inhaler Inhale 1 puff into the lungs 2 (two) times daily.      Marland Kitchen buPROPion (WELLBUTRIN XL) 300 MG 24 hr tablet Take 1 tablet (300 mg total) by mouth daily.  90 tablet  1  . estrogen-methylTESTOSTERone (ESTRATEST) 1.25-2.5 MG per tablet Take 1 tablet by mouth daily.        . fluticasone (FLONASE) 50 MCG/ACT nasal spray Place 2 sprays into the nose daily.  16 g  5  . Fluticasone-Salmeterol (ADVAIR DISKUS) 250-50 MCG/DOSE AEPB 1 puff into the lungs twice a day  60 each  0  . DISCONTD: bisoprolol (ZEBETA) 5 MG tablet TAKE 1 TABLET (5 MG TOTAL) BY MOUTH DAILY.  90 tablet  1  . DISCONTD: losartan (COZAAR) 50 MG tablet TAKE 1 TABLET (50 MG TOTAL) BY MOUTH 2 (TWO) TIMES DAILY.  180 tablet  1       Review of Systems    see Hpi Objective:  Physical Exam Physical Exam  Nursing note and vitals reviewed.  Constitutional: She is oriented to person, place, and time. She appears well-developed and well-nourished.  HENT:  Head: Normocephalic and atraumatic.  Cardiovascular: Normal rate and regular rhythm. Exam reveals no gallop and no friction rub.  No murmur heard.  Pulmonary/Chest: Breath sounds normal. She has no wheezes. She has no rales.  Neurological: She is alert and oriented to person, place, and time.  Skin: Skin is warm and dry.  Psychiatric: She has a normal mood and affect. Her behavior is normal.              Assessment & Plan:  Anxiety/depression  Continue Xanax and Wellbutrin.  See therapist and psychiatrist  HTN  Reo-rdered Cozaar and Zebeta  Asthma  controlled

## 2011-09-02 NOTE — Patient Instructions (Addendum)
Labs will be mailed to you  See me as needed 

## 2011-09-08 ENCOUNTER — Ambulatory Visit (HOSPITAL_BASED_OUTPATIENT_CLINIC_OR_DEPARTMENT_OTHER)
Admission: RE | Admit: 2011-09-08 | Discharge: 2011-09-08 | Disposition: A | Discharge status: Home / Self Care | Payer: BC Managed Care – PPO | Source: Ambulatory Visit | Attending: Unknown Physician Specialty | Admitting: Unknown Physician Specialty

## 2011-09-08 DIAGNOSIS — Z1231 Encounter for screening mammogram for malignant neoplasm of breast: Secondary | ICD-10-CM

## 2011-09-09 ENCOUNTER — Encounter: Payer: Self-pay | Admitting: *Deleted

## 2011-09-09 NOTE — Progress Notes (Signed)
Copy of labs mailed to pt's home address. 

## 2011-10-08 ENCOUNTER — Telehealth: Payer: Self-pay | Admitting: Internal Medicine

## 2011-10-08 NOTE — Telephone Encounter (Signed)
Casey Rowe   Call pt and give her an appt to see me for her asthma.  I want to lower the dose of her Advair but I want to see her first befoe I change dosing  I refilled her Advair on Friday

## 2011-10-12 ENCOUNTER — Telehealth: Payer: Self-pay | Admitting: *Deleted

## 2011-10-13 ENCOUNTER — Ambulatory Visit: Payer: BC Managed Care – PPO | Admitting: Internal Medicine

## 2011-10-19 ENCOUNTER — Encounter: Payer: Self-pay | Admitting: Internal Medicine

## 2011-10-19 ENCOUNTER — Ambulatory Visit (INDEPENDENT_AMBULATORY_CARE_PROVIDER_SITE_OTHER): Payer: BC Managed Care – PPO | Admitting: Internal Medicine

## 2011-10-19 VITALS — BP 109/68 | HR 63 | Temp 97.4°F | Resp 16 | Wt 160.0 lb

## 2011-10-19 DIAGNOSIS — J029 Acute pharyngitis, unspecified: Secondary | ICD-10-CM

## 2011-10-19 DIAGNOSIS — J45909 Unspecified asthma, uncomplicated: Secondary | ICD-10-CM

## 2011-10-19 DIAGNOSIS — F4310 Post-traumatic stress disorder, unspecified: Secondary | ICD-10-CM

## 2011-10-19 DIAGNOSIS — I10 Essential (primary) hypertension: Secondary | ICD-10-CM

## 2011-10-19 MED ORDER — MOMETASONE FURO-FORMOTEROL FUM 200-5 MCG/ACT IN AERO: 2.0000 | INHALATION_SPRAY | Freq: Two times a day (BID) | RESPIRATORY_TRACT | Status: DC
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
## 2011-10-19 NOTE — Patient Instructions (Addendum)
See me in 2-3 weeks  Use Dulera 2 inhalations bid

## 2011-10-27 NOTE — Telephone Encounter (Signed)
Mailed results to pt.

## 2011-12-03 IMAGING — CT CT ABD-PELV W/ CM
2 of 5 series · 16 of 46 positions shown, 18 images · IV contrast (agent unspecified)
Comparison: None.

CLINICAL DATA: Diarrhea and abdominal cramping for 1 month.
Question colitis.  Hysterectomy.

CT ABDOMEN AND PELVIS WITH CONTRAST
TECHNIQUE: Multidetector CT imaging of the abdomen and pelvis was
performed following the standard protocol during bolus
administration of intravenous contrast.
Contrast: 100  ml [id number]

[Series 2: abd/pelvis 5.0 b31f · axial · 0.73mm/px · z∈[-395,+5]mm · 13 of 90 slices shown, 15 images]
[im 5/90  soft-tissue]
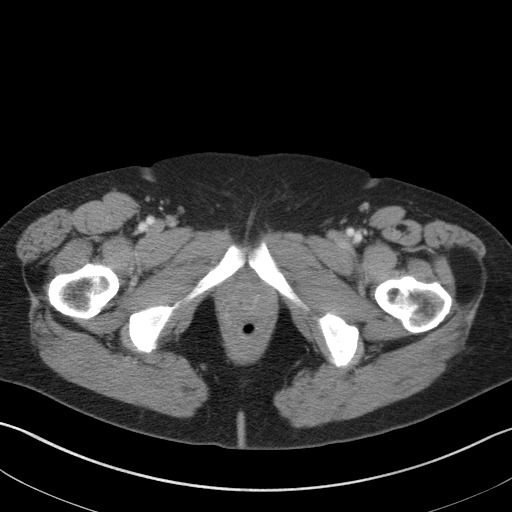
[im 5/90  bone]
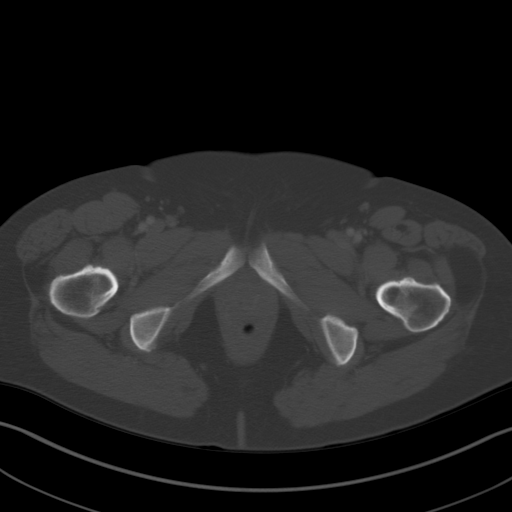
[im 15/90  soft-tissue]
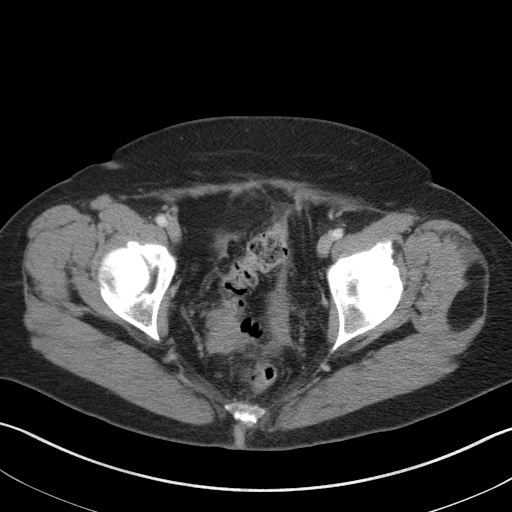
[im 19/90  soft-tissue]
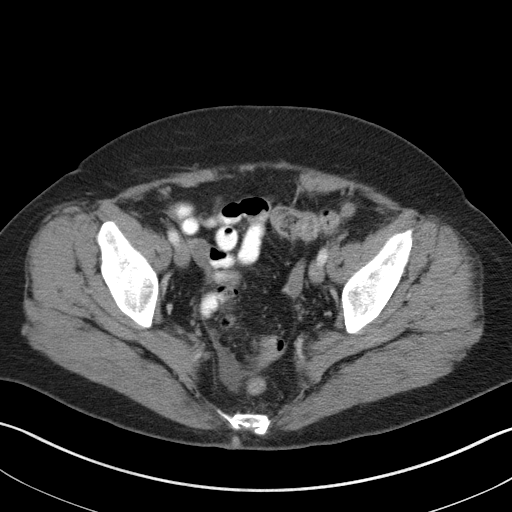
[im 24/90  soft-tissue]
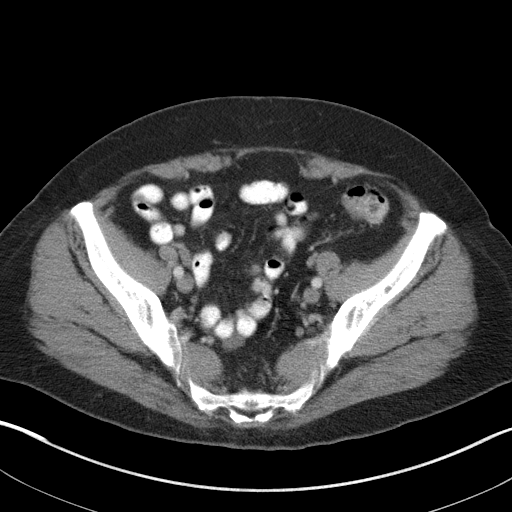
[im 33/90  soft-tissue]
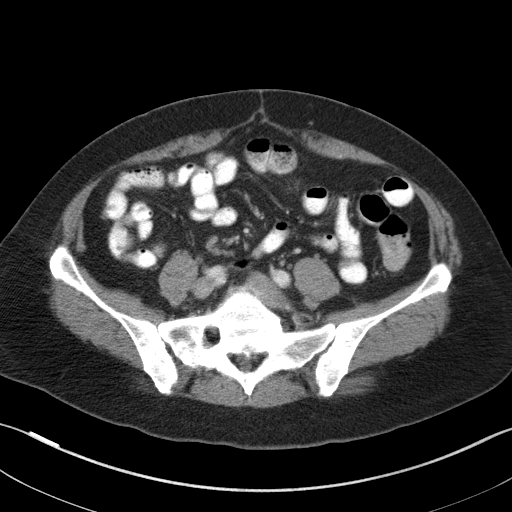
[im 38/90  soft-tissue]
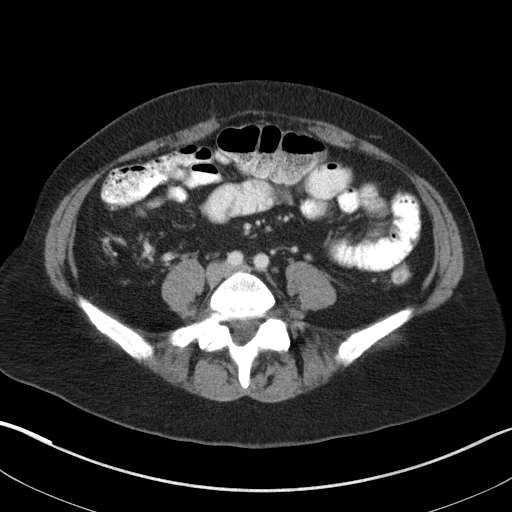
[im 47/90  soft-tissue]
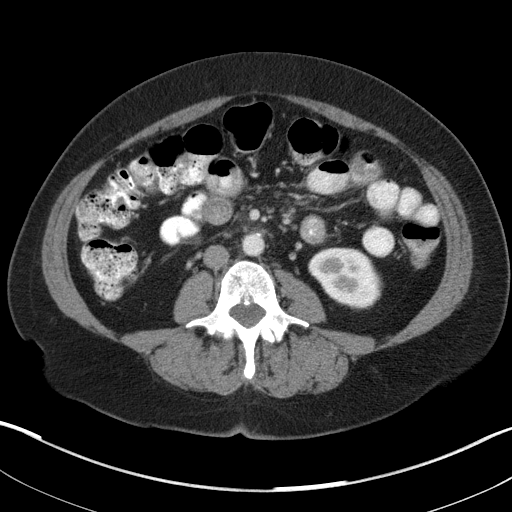
[im 52/90  soft-tissue]
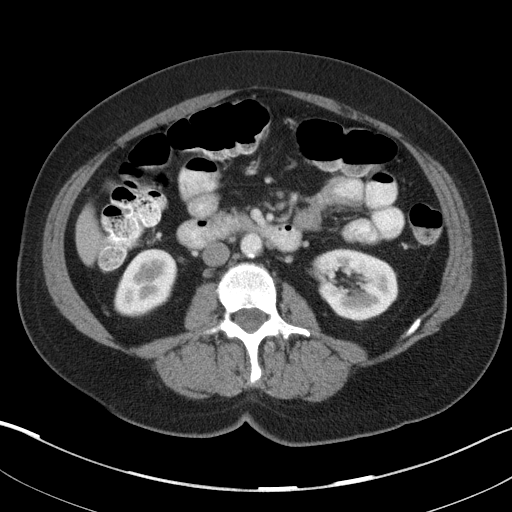
[im 57/90  soft-tissue]
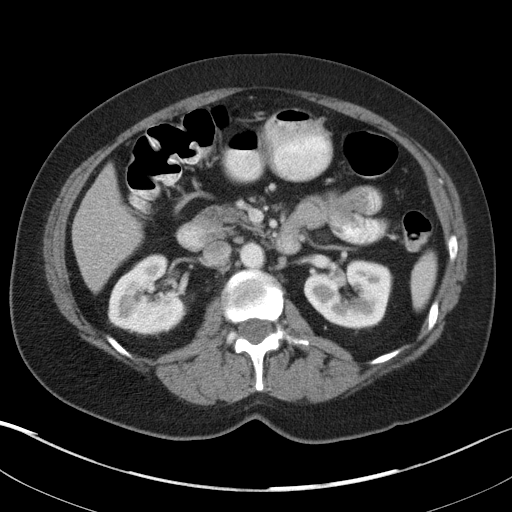
[im 57/90  bone]
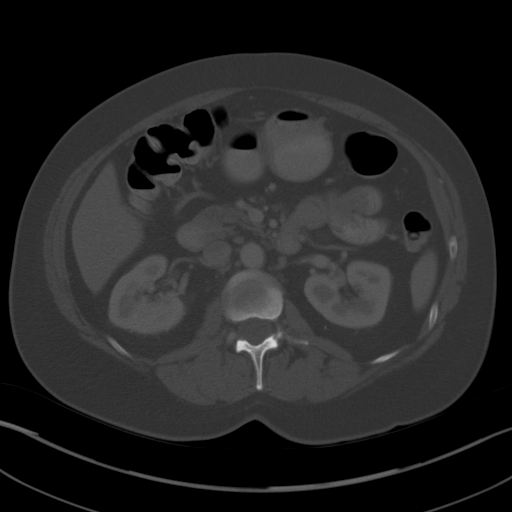
[im 66/90  soft-tissue]
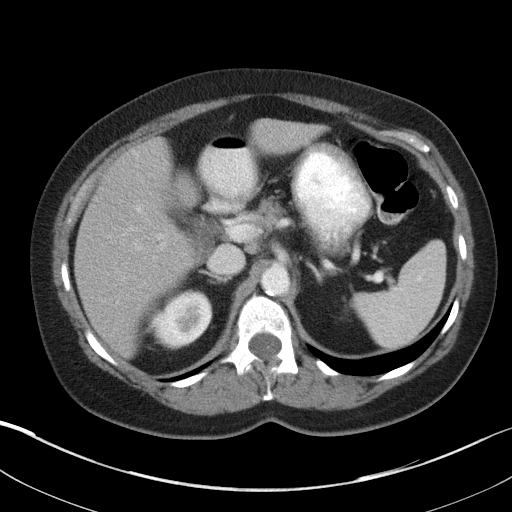
[im 71/90  soft-tissue]
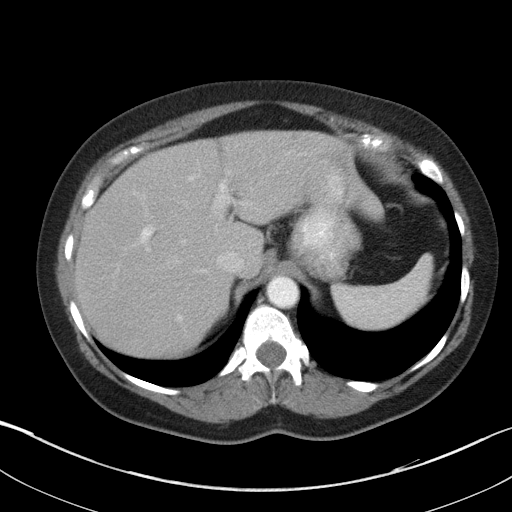
[im 75/90  soft-tissue]
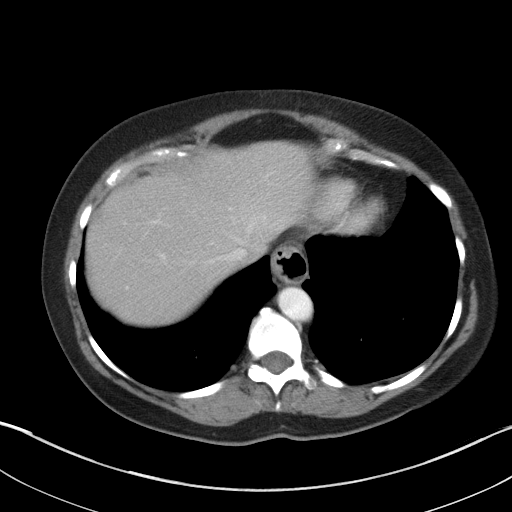
[im 85/90  soft-tissue]
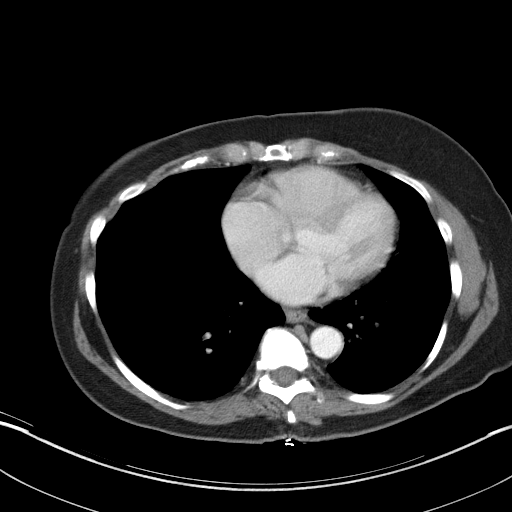

[Series 7: abd/pelvis 3.0 coronal · coronal · 0.71mm/px · 3 of 96 slices shown]
[im 32/96  soft-tissue]
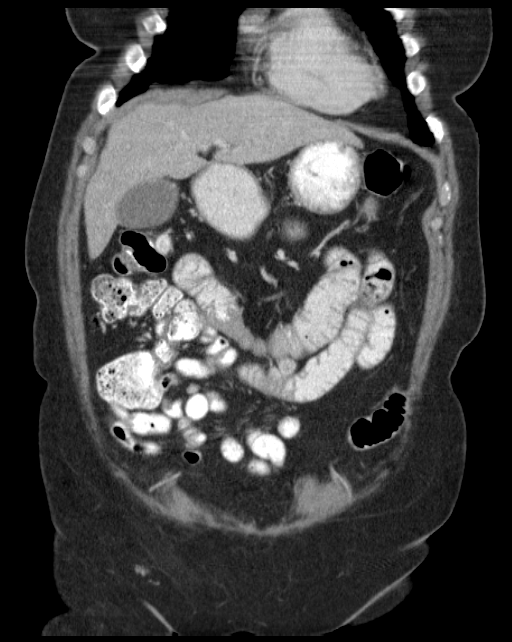
[im 43/96  soft-tissue]
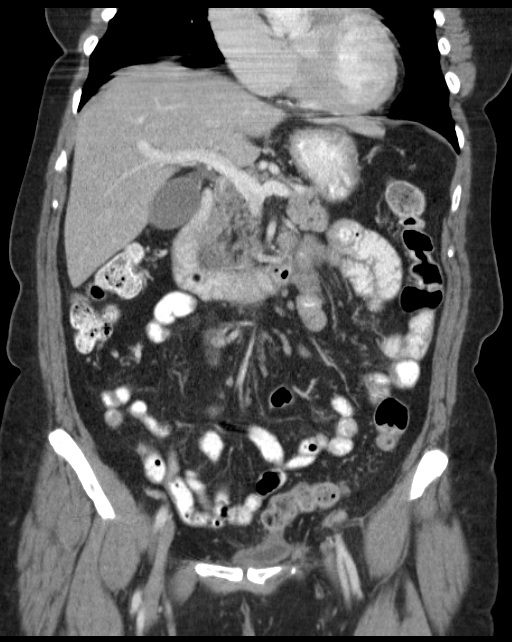
[im 53/96  soft-tissue]
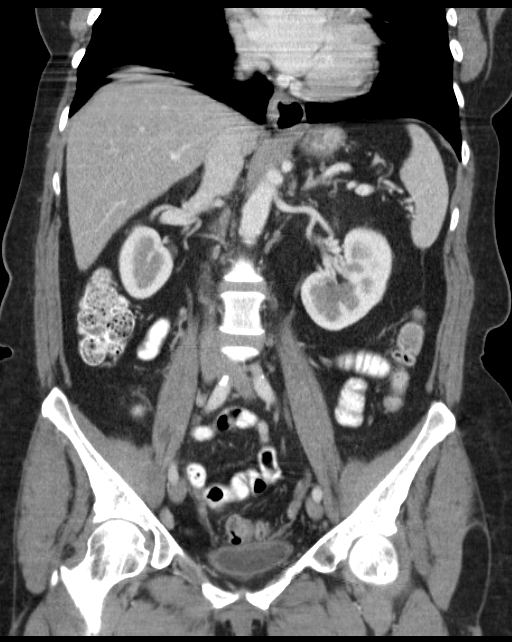

[16 of 46 positions shown; findings below may reference images not displayed]

FINDINGS: Minimal motion degradation at the lung bases.  Heart size
upper limits of normal without pericardial or pleural effusion.  A
small hiatal hernia.  Normal liver, spleen, distal stomach,
pancreas, gallbladder, biliary tract, adrenal glands, kidneys. No
retroperitoneal or retrocrural adenopathy.

Mild sigmoid diverticulosis.  No colonic wall thickening to suggest
colitis. Normal terminal ileum and appendix.  Normal small bowel
without abdominal ascites.

  No pelvic adenopathy.  Hysterectomy.  There is mild ill-defined
edema within the pelvis.  Example anteriorly on image 76.  Trace
cul-de-sac fluid on image 73.

Question bladder wall thickening / heterogeneity, including on
image 79.  This may be partially due to underdistension.

No acute osseous abnormality.
IMPRESSION: 1.  Mild nonspecific pelvic edema and trace cul-de-sac fluid.
Given concurrent sigmoid diverticulosis, question mild
uncomplicated diverticulitis.
2.  Possible bladder wall thickening, versus underdistension.
Consider correlation with urinalysis to exclude cystitis.
3.  Small hiatal hernia.

## 2012-02-21 ENCOUNTER — Ambulatory Visit: Payer: BC Managed Care – PPO | Admitting: Family

## 2012-02-22 ENCOUNTER — Encounter: Payer: Self-pay | Admitting: Internal Medicine

## 2012-02-22 ENCOUNTER — Ambulatory Visit (INDEPENDENT_AMBULATORY_CARE_PROVIDER_SITE_OTHER): Payer: BC Managed Care – PPO | Admitting: Internal Medicine

## 2012-02-22 ENCOUNTER — Other Ambulatory Visit: Payer: Self-pay | Admitting: *Deleted

## 2012-02-22 VITALS — BP 133/79 | HR 69 | Temp 97.9°F | Resp 20 | Wt 156.0 lb

## 2012-02-22 DIAGNOSIS — J029 Acute pharyngitis, unspecified: Secondary | ICD-10-CM

## 2012-02-22 DIAGNOSIS — J040 Acute laryngitis: Secondary | ICD-10-CM | POA: Insufficient documentation

## 2012-02-22 DIAGNOSIS — R062 Wheezing: Secondary | ICD-10-CM

## 2012-02-22 DIAGNOSIS — J45909 Unspecified asthma, uncomplicated: Secondary | ICD-10-CM

## 2012-02-22 DIAGNOSIS — J45901 Unspecified asthma with (acute) exacerbation: Secondary | ICD-10-CM

## 2012-02-22 HISTORY — DX: Unspecified asthma with (acute) exacerbation: J45.901

## 2012-02-22 LAB — RAPID STREP SCREEN (MED CTR MEBANE ONLY)

## 2012-02-22 MED ORDER — ALBUTEROL SULFATE (5 MG/ML) 0.5% IN NEBU
5.0000 mg | INHALATION_SOLUTION | Freq: Once | RESPIRATORY_TRACT | Status: AC
  Administered 2012-02-22: 5 mg via RESPIRATORY_TRACT

## 2012-02-22 MED ORDER — AZITHROMYCIN 250 MG PO TABS: ORAL_TABLET | ORAL | Status: DC

## 2012-02-22 MED ORDER — PREDNISONE 20 MG PO TABS: ORAL_TABLET | ORAL | Status: DC

## 2012-02-22 MED ORDER — BISOPROLOL FUMARATE 5 MG PO TABS: 5.0000 mg | ORAL_TABLET | Freq: Every day | ORAL | Status: DC

## 2012-02-22 MED ORDER — LOSARTAN POTASSIUM 50 MG PO TABS: 50.0000 mg | ORAL_TABLET | Freq: Two times a day (BID) | ORAL | Status: DC

## 2012-02-22 MED ORDER — HYDROCOD POLST-CHLORPHEN POLST 10-8 MG/5ML PO LQCR: 5.0000 mL | Freq: Two times a day (BID) | ORAL | Status: DC | PRN

## 2012-02-22 MED ORDER — METHYLPREDNISOLONE ACETATE 80 MG/ML IJ SUSP
120.0000 mg | Freq: Once | INTRAMUSCULAR | Status: AC
  Administered 2012-02-22: 120 mg via INTRAMUSCULAR

## 2012-02-22 MED ORDER — CEFTRIAXONE SODIUM 1 G IJ SOLR
1.0000 g | Freq: Once | INTRAMUSCULAR | Status: AC
  Administered 2012-02-22: 1 g via INTRAMUSCULAR

## 2012-02-22 NOTE — Progress Notes (Signed)
Subjective:    Patient ID: Casey Rowe, female    DOB: 1950-04-20, 62 y.o.   MRN: [id number]  HPI Casey Rowe is here for acute visit  Began with severe sore throat 2 days ago.  Now with laryngitis,  Increase in wheezing,  Using Albuterol  More frequently.  She has dry cough at night  No documented fever or chest pain Allergies  Allergen Reactions  . Demerol Nausea And Vomiting   Past Medical History  Diagnosis Date  . Broken jaw 1980s  . Tubal pregnancy 1981  . Sarcoidosis   . Anxiety   . Depression   . Hypertension   . Asthma   . Allergy    Past Surgical History  Procedure Date  . Fracture surgery 1980s    broken jaw  . Ectopic pregnancy surgery 1981  . Cesarean section   . Abdominal hysterectomy    History   Social History  . Marital Status: Married    Spouse Name: N/A    Number of Children: N/A  . Years of Education: N/A   Occupational History  . Not on file.   Social History Main Topics  . Smoking status: Never Smoker   . Smokeless tobacco: Never Used  . Alcohol Use: Yes  . Drug Use: No  . Sexually Active: Yes   Other Topics Concern  . 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 Hypertension Mother   . Dementia Mother   . Diabetes Mother   . Diabetes Father   . Heart attack Father   . Heart disease Father   . Psoriasis Father   . Cancer Maternal Aunt     breast   Patient Active Problem List  Diagnosis  . Diarrhea  . Allergic sinusitis  . Anxiety  . Depression  . Hypertension  . Extrinsic asthma  . Sarcoidosis  . Allergy  . Atypical chest pain  . PTSD (post-traumatic stress disorder)  . Asthma exacerbation  . Laryngitis   Current Outpatient Prescriptions on File Prior to Visit  Medication Sig Dispense Refill  . albuterol (PROVENTIL HFA;VENTOLIN HFA) 108 (90 BASE) MCG/ACT inhaler Inhale 2 puffs into the lungs every 6 (six)  hours as needed for wheezing.  1 Inhaler  0  . ALPRAZolam (XANAX) 0.25 MG tablet Take 0.25 mg by mouth 3 (three) times daily as needed.      . bisoprolol (ZEBETA) 5 MG tablet Take 1 tablet (5 mg total) by mouth daily.  90 tablet  3  . buPROPion (WELLBUTRIN XL) 300 MG 24 hr tablet Take 1 tablet (300 mg total) by mouth daily.  90 tablet  1  . estrogen-methylTESTOSTERone (ESTRATEST) 1.25-2.5 MG per tablet Take 1 tablet by mouth daily.        Marland Kitchen losartan (COZAAR) 50 MG tablet Take 1 tablet (50 mg total) by mouth 2 (two) times daily.  180 tablet  3  . Mometasone Furo-Formoterol Fum (DULERA) 200-5 MCG/ACT AERO Inhale 2 puffs into the lungs 2 (two) times daily.  1 Inhaler  2  . fluticasone (FLONASE) 50 MCG/ACT nasal spray Place 2 sprays into the nose daily.  16 g  5       Review of Systems See HPI    Objective:   Physical Exam Physical Exam  Nursing note and vitals reviewed.  Constitutional: She is oriented to person, place, and time.  She appears well-developed and well-nourished. She is cooperative.  HENT:  Head: Normocephalic and atraumatic.  Nose: Mucosal edema present.  Eyes: Conjunctivae and EOM are normal. Pupils are equal, round, and reactive to light.  Neck: Neck supple.  Cardiovascular: Regular rhythm, normal heart sounds, intact distal pulses and normal pulses. Exam reveals no gallop and no friction rub.  No murmur heard.  Pulmonary/Chest: She has bilateral   End expiratory wheezing. She has rhonchi. She has no rales.  Neurological: She is alert and oriented to person, place, and time.  Skin: Skin is warm and dry. No abrasion, no bruising, no ecchymosis and no rash noted. No cyanosis. Nails show no clubbing.  Psychiatric: She has a normal mood and affect. Her speech is normal and behavior is normal.            Assessment & Plan:  Asthma exacerbation:   Strep negative.   Will give Rocephin  1 gm and Depromedrol 120 mg IM today  Prednisone 60 mg taper as outpt.  Continue to  use Dulera bid and albuterol rescue.  See me in 4 weeks or sooner prn  Cough  tussionex 1 tsp q12h prn  Bronchitis  Zpak  See me in 4 weeks or sooner as needed

## 2012-02-22 NOTE — Telephone Encounter (Signed)
Refill request

## 2012-05-03 ENCOUNTER — Other Ambulatory Visit: Payer: Self-pay | Admitting: Family Medicine

## 2012-05-03 NOTE — Telephone Encounter (Signed)
DENIED--Pt no longer under prescriber care/SLS

## 2012-06-22 ENCOUNTER — Telehealth: Payer: Self-pay | Admitting: *Deleted

## 2012-06-22 DIAGNOSIS — J309 Allergic rhinitis, unspecified: Secondary | ICD-10-CM

## 2012-06-22 MED ORDER — FLUTICASONE PROPIONATE 50 MCG/ACT NA SUSP: 2.0000 | Freq: Every day | NASAL | Status: DC

## 2012-06-22 NOTE — Telephone Encounter (Signed)
Pt requesting that we send in an order for flonase states that she thought allergies would be Ok this year and she didn't fill it earlier. PTdenies cough or fever states that all her sx are upper resp

## 2012-10-04 ENCOUNTER — Other Ambulatory Visit: Payer: Self-pay | Admitting: Internal Medicine

## 2012-10-05 NOTE — Telephone Encounter (Signed)
Refill request

## 2013-01-16 ENCOUNTER — Other Ambulatory Visit (HOSPITAL_BASED_OUTPATIENT_CLINIC_OR_DEPARTMENT_OTHER): Payer: Self-pay | Admitting: Unknown Physician Specialty

## 2013-01-16 DIAGNOSIS — Z1231 Encounter for screening mammogram for malignant neoplasm of breast: Secondary | ICD-10-CM

## 2013-01-21 ENCOUNTER — Other Ambulatory Visit: Payer: Self-pay | Admitting: Internal Medicine

## 2013-01-22 NOTE — Telephone Encounter (Signed)
Pt will come in for labs on 12/22

## 2013-01-29 ENCOUNTER — Encounter: Payer: Self-pay | Admitting: Internal Medicine

## 2013-01-29 ENCOUNTER — Ambulatory Visit (HOSPITAL_BASED_OUTPATIENT_CLINIC_OR_DEPARTMENT_OTHER)
Admission: RE | Admit: 2013-01-29 | Discharge: 2013-01-29 | Disposition: A | Discharge status: Home / Self Care | Payer: BC Managed Care – PPO | Source: Ambulatory Visit | Attending: Unknown Physician Specialty | Admitting: Unknown Physician Specialty

## 2013-01-29 ENCOUNTER — Ambulatory Visit (INDEPENDENT_AMBULATORY_CARE_PROVIDER_SITE_OTHER): Payer: BC Managed Care – PPO | Admitting: Internal Medicine

## 2013-01-29 VITALS — BP 136/77 | HR 65 | Temp 98.3°F | Resp 18

## 2013-01-29 DIAGNOSIS — Z13220 Encounter for screening for lipoid disorders: Secondary | ICD-10-CM

## 2013-01-29 DIAGNOSIS — J45901 Unspecified asthma with (acute) exacerbation: Secondary | ICD-10-CM

## 2013-01-29 DIAGNOSIS — Z1231 Encounter for screening mammogram for malignant neoplasm of breast: Secondary | ICD-10-CM | POA: Insufficient documentation

## 2013-01-29 DIAGNOSIS — H9193 Unspecified hearing loss, bilateral: Secondary | ICD-10-CM

## 2013-01-29 DIAGNOSIS — Z1329 Encounter for screening for other suspected endocrine disorder: Secondary | ICD-10-CM

## 2013-01-29 DIAGNOSIS — H9190 Unspecified hearing loss, unspecified ear: Secondary | ICD-10-CM

## 2013-01-29 DIAGNOSIS — I10 Essential (primary) hypertension: Secondary | ICD-10-CM

## 2013-01-29 DIAGNOSIS — Z13820 Encounter for screening for osteoporosis: Secondary | ICD-10-CM

## 2013-01-29 DIAGNOSIS — J329 Chronic sinusitis, unspecified: Secondary | ICD-10-CM

## 2013-01-29 LAB — LIPID PANEL
Cholesterol: 196 mg/dL (ref 0–200)
HDL: 34 mg/dL — ABNORMAL LOW (ref >39)
Total CHOL/HDL Ratio: 5.8 Ratio
Triglycerides: 170 mg/dL — ABNORMAL HIGH (ref <150)
VLDL: 34 mg/dL (ref 0–40)

## 2013-01-29 LAB — CBC WITH DIFFERENTIAL/PLATELET
Basophils Absolute: 0.1 10*3/uL (ref 0.0–0.1)
Basophils Relative: 1 % (ref 0–1)
HCT: 42.9 % (ref 36.0–46.0)
Hemoglobin: 14.8 g/dL (ref 12.0–15.0)
Lymphocytes Relative: 26 % (ref 12–46)
Lymphs Abs: 1.6 10*3/uL (ref 0.7–4.0)
MCH: 32.5 pg (ref 26.0–34.0)
MCHC: 34.5 g/dL (ref 30.0–36.0)
Monocytes Absolute: 0.6 10*3/uL (ref 0.1–1.0)
Monocytes Relative: 10 % (ref 3–12)
Neutro Abs: 3.5 10*3/uL (ref 1.7–7.7)
Platelets: 237 10*3/uL (ref 150–400)
RBC: 4.56 MIL/uL (ref 3.87–5.11)

## 2013-01-29 LAB — COMPREHENSIVE METABOLIC PANEL
AST: 20 U/L (ref 0–37)
Alkaline Phosphatase: 66 U/L (ref 39–117)
BUN: 10 mg/dL (ref 6–23)
Calcium: 9.4 mg/dL (ref 8.4–10.5)
Creat: 0.84 mg/dL (ref 0.50–1.10)
Total Protein: 6.8 g/dL (ref 6.0–8.3)

## 2013-01-29 MED ORDER — BISOPROLOL FUMARATE 5 MG PO TABS: ORAL_TABLET | ORAL | Status: DC

## 2013-01-29 MED ORDER — BUPROPION HCL ER (XL) 300 MG PO TB24: 300.0000 mg | ORAL_TABLET | Freq: Every day | ORAL | Status: DC

## 2013-01-29 MED ORDER — BISOPROLOL FUMARATE 5 MG PO TABS: 5.0000 mg | ORAL_TABLET | Freq: Every day | ORAL | Status: DC

## 2013-01-29 MED ORDER — DOXYCYCLINE HYCLATE 100 MG PO TABS: 100.0000 mg | ORAL_TABLET | Freq: Two times a day (BID) | ORAL | Status: DC

## 2013-01-29 MED ORDER — LOSARTAN POTASSIUM 50 MG PO TABS: 50.0000 mg | ORAL_TABLET | Freq: Every day | ORAL | Status: DC

## 2013-01-29 NOTE — Patient Instructions (Signed)
Will refer to Dr. Richardson Landry  ENT in [place]    Get labs today    Schedule CPE

## 2013-01-29 NOTE — Progress Notes (Signed)
Subjective:    Patient ID: Casey Rowe, female    DOB: 06-26-1950, 62 y.o.   MRN: [id number]  HPI Sherleen is here for follow up.  She has not had any labwork in 2 years.  She did have a CPE  Per  her gyn with pap last year.   She reports she has had difficulty getting off her flonase.  She uses about year round.   She constantly has posterior drainage.  She has hearing loss and would like to see an ENT.    Tolerating antihypertensives   Allergies  Allergen Reactions  . Demerol Nausea And Vomiting   Past Medical History  Diagnosis Date  . Broken jaw 1980s  . Tubal pregnancy 1981  . Sarcoidosis   . Anxiety   . Depression   . Hypertension   . Asthma   . Allergy    Past Surgical History  Procedure Laterality Date  . Fracture surgery  1980s    broken jaw  . Ectopic pregnancy surgery  1981  . Cesarean section    . Abdominal hysterectomy     History   Social History  . Marital Status: Married    Spouse Name: N/A    Number of Children: N/A  . Years of Education: N/A   Occupational History  . Not on file.   Social History Main Topics  . Smoking status: Never Smoker   . Smokeless tobacco: Never Used  . Alcohol Use: Yes  . Drug Use: No  . Sexual Activity: Yes   Other Topics Concern  . 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 Hypertension Mother   . Dementia Mother   . Diabetes Mother   . Diabetes Father   . Heart attack Father   . Heart disease Father   . Psoriasis Father   . Cancer Maternal Aunt     breast   Patient Active Problem List   Diagnosis Date Noted  . Asthma exacerbation 02/22/2012  . Laryngitis 02/22/2012  . PTSD (post-traumatic stress disorder) 10/19/2011  . Atypical chest pain 02/10/2011  . Anxiety   . Depression   . Hypertension   . Extrinsic asthma   . Sarcoidosis   . Allergy   . Allergic sinusitis  01/18/2011  . Diarrhea 10/06/2010   Current Outpatient Prescriptions on File Prior to Visit  Medication Sig Dispense Refill  . ALPRAZolam (XANAX) 0.25 MG tablet Take 0.25 mg by mouth 3 (three) times daily as needed.      Marland Kitchen estrogen-methylTESTOSTERone (ESTRATEST) 1.25-2.5 MG per tablet Take 1 tablet by mouth daily.        . fluticasone (FLONASE) 50 MCG/ACT nasal spray Place 2 sprays into the nose daily.  16 g  1  . Mometasone Furo-Formoterol Fum (DULERA) 200-5 MCG/ACT AERO Inhale 2 puffs into the lungs 2 (two) times daily.  1 Inhaler  2  . albuterol (PROVENTIL HFA;VENTOLIN HFA) 108 (90 BASE) MCG/ACT inhaler Inhale 2 puffs into the lungs every 6 (six) hours as needed for wheezing.  1 Inhaler  0   No current facility-administered medications on file prior to visit.      Review of Systems    see HPI Objective:   Physical Exam Physical Exam  Nursing note and vitals reviewed.  Constitutional: She is  oriented to person, place, and time. She appears well-developed and well-nourished.  HENT:  Head: Normocephalic and atraumatic.    Tm;'s  Serous effusion Cardiovascular: Normal rate and regular rhythm. Exam reveals no gallop and no friction rub.  No murmur heard.  Pulmonary/Chest: Breath sounds normal. She has no wheezes. She has no rales.  Neurological: She is alert and oriented to person, place, and time.  Skin: Skin is warm and dry.  Psychiatric: She has a normal mood and affect. Her behavior is normal.             Assessment & Plan:  Hearing loss  Sinusitis   Will refer to ENT  Pt request [place].    HTN  Will get full panel of labs today.   Ok to refill labs  Overuse of  flonase:  I advised That flonase is not to be used daily year round.  Explained side effect of atrophy and at worse nasal perforation. Will get ENT opinion  Regarding use.  May need tapering dose of prednisone to wean off    Schedule CPE with me

## 2013-02-05 ENCOUNTER — Encounter: Payer: Self-pay | Admitting: *Deleted

## 2013-02-15 ENCOUNTER — Other Ambulatory Visit: Payer: Self-pay | Admitting: *Deleted

## 2013-02-15 ENCOUNTER — Telehealth: Payer: Self-pay | Admitting: *Deleted

## 2013-02-15 MED ORDER — MOMETASONE FURO-FORMOTEROL FUM 200-5 MCG/ACT IN AERO: 2.0000 | INHALATION_SPRAY | Freq: Two times a day (BID) | RESPIRATORY_TRACT | Status: DC

## 2013-02-15 NOTE — Telephone Encounter (Signed)
Would like refill of Dulera Inhaler

## 2013-02-15 NOTE — Telephone Encounter (Signed)
Refill request

## 2013-04-02 ENCOUNTER — Telehealth: Payer: Self-pay | Admitting: *Deleted

## 2013-04-12 NOTE — Telephone Encounter (Signed)
Error

## 2013-04-12 NOTE — Telephone Encounter (Signed)
Duplicate

## 2013-04-17 ENCOUNTER — Other Ambulatory Visit: Payer: Self-pay | Admitting: *Deleted

## 2013-04-17 DIAGNOSIS — J309 Allergic rhinitis, unspecified: Secondary | ICD-10-CM

## 2013-04-17 NOTE — Telephone Encounter (Signed)
Refill request

## 2013-04-18 ENCOUNTER — Emergency Department
Admission: EM | Admit: 2013-04-18 | Discharge: 2013-04-18 | Disposition: A | Discharge status: Home / Self Care | Payer: BC Managed Care – PPO | Source: Home / Self Care | Attending: Family Medicine | Admitting: Family Medicine

## 2013-04-18 ENCOUNTER — Encounter: Payer: Self-pay | Admitting: Emergency Medicine

## 2013-04-18 DIAGNOSIS — J309 Allergic rhinitis, unspecified: Secondary | ICD-10-CM

## 2013-04-18 DIAGNOSIS — J3089 Other allergic rhinitis: Secondary | ICD-10-CM

## 2013-04-18 DIAGNOSIS — M940 Chondrocostal junction syndrome [Tietze]: Secondary | ICD-10-CM

## 2013-04-18 DIAGNOSIS — J069 Acute upper respiratory infection, unspecified: Secondary | ICD-10-CM

## 2013-04-18 MED ORDER — AMOXICILLIN 875 MG PO TABS: 875.0000 mg | ORAL_TABLET | Freq: Two times a day (BID) | ORAL | Status: DC

## 2013-04-18 MED ORDER — METHYLPREDNISOLONE SODIUM SUCC 125 MG IJ SOLR
80.0000 mg | Freq: Once | INTRAMUSCULAR | Status: AC
  Administered 2013-04-18: 80 mg via INTRAMUSCULAR

## 2013-04-18 MED ORDER — GUAIFENESIN ER 600 MG PO TB12: 1200.0000 mg | ORAL_TABLET | Freq: Two times a day (BID) | ORAL | Status: DC

## 2013-04-18 MED ORDER — PREDNISONE 20 MG PO TABS: 20.0000 mg | ORAL_TABLET | Freq: Two times a day (BID) | ORAL | Status: DC

## 2013-04-18 MED ORDER — BENZONATATE 200 MG PO CAPS: 200.0000 mg | ORAL_CAPSULE | Freq: Every day | ORAL | Status: DC

## 2013-04-18 MED ORDER — FLUTICASONE PROPIONATE 50 MCG/ACT NA SUSP: 1.0000 | Freq: Every day | NASAL | Status: DC

## 2013-04-18 NOTE — ED Notes (Signed)
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 Last week her symptoms became worse. COugh is wet, nasal congestin/musous is green, c/o sweats but denies fever.

## 2013-04-18 NOTE — ED Provider Notes (Signed)
CSN: [id number]     Arrival date & time 04/18/13  1021 History   First MD Initiated Contact with Patient 04/18/13 1111     Chief Complaint  Patient presents with  . URI      HPI Comments: Patient has a history of chronic perennial rhinitis, especially allergic to molds, and she has had multiple episodes of sinusitis in the past.  Over the past week, her usual symptoms have become distinctly worse with onset of a "scratchy" throat, ear pressure, cough with occasional wheezing, increased sinus congestion, and chills/sweats.   The history is provided by the patient.    Past Medical History  Diagnosis Date  . Broken jaw 1980s  . Tubal pregnancy 1981  . Sarcoidosis   . Anxiety   . Depression   . Hypertension   . Asthma   . Allergy    Past Surgical History  Procedure Laterality Date  . Fracture surgery  1980s    broken jaw  . Ectopic pregnancy surgery  1981  . Cesarean section    . Abdominal hysterectomy     Family History  Problem Relation Age of Onset  . Hypertension Mother   . Dementia Mother   . Diabetes Mother   . Diabetes Father   . Heart attack Father   . Heart disease Father   . Psoriasis Father   . Cancer Maternal Aunt     breast   History  Substance Use Topics  . Smoking status: Never Smoker   . Smokeless tobacco: Never Used  . Alcohol Use: Yes   OB History   Grav Para Term Preterm Abortions TAB SAB Ect Mult Living   6 3   3 1 2         Review of Systems No sore throat + cough No pleuritic pain, but has pressure sensation over sternum + wheezing + nasal congestion + post-nasal drainage + sinus pain/pressure No itchy/red eyes ? earache No hemoptysis No SOB No fever, + chills/sweats No nausea No vomiting No abdominal pain No diarrhea No urinary symptoms No skin rash + fatigue No myalgias No headache    Allergies  Demerol  Home Medications   Current Outpatient Rx  Name  Route  Sig  Dispense  Refill  . ALPRAZolam (XANAX) 0.25 MG  tablet   Oral   Take 0.25 mg by mouth 3 (three) times daily as needed.         Marland Kitchen amoxicillin (AMOXIL) 875 MG tablet   Oral   Take 1 tablet (875 mg total) by mouth 2 (two) times daily.   20 tablet   0   . benzonatate (TESSALON) 200 MG capsule   Oral   Take 1 capsule (200 mg total) by mouth at bedtime. Take as needed for cough   12 capsule   0   . bisoprolol (ZEBETA) 5 MG tablet      Take one tablet bid   180 tablet   1     No refills available   . buPROPion (WELLBUTRIN XL) 300 MG 24 hr tablet   Oral   Take 1 tablet (300 mg total) by mouth daily.   90 tablet   1   . estrogen-methylTESTOSTERone (ESTRATEST) 1.25-2.5 MG per tablet   Oral   Take 1 tablet by mouth daily.           . fluticasone (FLONASE) 50 MCG/ACT nasal spray   Each Nare   Place 1 spray into both nostrils daily.   [place]  g   0   . guaiFENesin (MUCINEX) 600 MG 12 hr tablet   Oral   Take 2 tablets (1,200 mg total) by mouth 2 (two) times daily. (Take with at least 8oz water)   40 tablet   1   . losartan (COZAAR) 50 MG tablet   Oral   Take 1 tablet (50 mg total) by mouth daily.   60 tablet   2     Rx has expired - unused refills remain   . mometasone-formoterol (DULERA) 200-5 MCG/ACT AERO   Inhalation   Inhale 2 puffs into the lungs 2 (two) times daily.   1 Inhaler   2   . predniSONE (DELTASONE) 20 MG tablet   Oral   Take 1 tablet (20 mg total) by mouth 2 (two) times daily. Take with food.   10 tablet   0    BP 121/81  Pulse 67  Temp(Src) 97.7 F (36.5 C) (Oral)  Resp 16  Ht 5\' 5"  (1.651 m)  Wt 159 lb (72.122 kg)  BMI 26.46 kg/m2  SpO2 100%  LMP 02/08/1997 Physical Exam Nursing notes and Vital Signs reviewed. Appearance:  Patient appears healthy, stated age, and in no acute distress Eyes:  Pupils are equal, round, and reactive to light and accomodation.  Extraocular movement is intact.  Conjunctivae are not inflamed  Ears:  Canals normal.  Tympanic membranes normal.  Nose:   Congested turbinates.  Mild maxillary sinus tenderness is present.  Pharynx:  Normal Neck:  Supple.  Tender enlarged posterior nodes are palpated bilaterally  Lungs:   Faint basilar wheezes present.  Breath sounds are equal.  Chest:  Distinct tenderness to palpation over the mid-sternum.  Heart:  Regular rate and rhythm without murmurs, rubs, or gallops.  Abdomen:  Nontender without masses or hepatosplenomegaly.  Bowel sounds are present.  No CVA or flank tenderness.  Extremities:  No edema.  No calf tenderness Skin:  No rash present.   ED Course  Procedures  none      MDM   1. Acute upper respiratory infections of unspecified site; suspect superimposed viral URI   2. Perennial allergic rhinitis   3. Costochondritis    Solumedrol 80mg  IM.  Begin prednisone burst tomorrow.  Prescription written for Benzonatate [facility]) to take at bedtime for night-time cough.  Begin amoxicillin. Take plain Mucinex (1200 mg guaifenesin) twice daily for cough and congestion.  May add Sudafed for sinus congestion.   Increase fluid intake, rest. May use Afrin nasal spray (or generic oxymetazoline) twice daily for about 3 to 5 days.  Also recommend using saline nasal spray several times daily and saline nasal irrigation (AYR is a common brand).  Use Flonase spray after Afrin and saline irrigation.  Stop all antihistamines for now, and other non-prescription cough/cold preparations.   Follow-up with family doctor if not improving 7 to 10 days.     Kandra Nicolas, MD 04/20/13 2008

## 2013-04-18 NOTE — Discharge Instructions (Signed)
Take plain Mucinex (1200 mg guaifenesin) twice daily for cough and congestion.  May add Sudafed for sinus congestion.   Increase fluid intake, rest. May use Afrin nasal spray (or generic oxymetazoline) twice daily for about 3 to 5 days.  Also recommend using saline nasal spray several times daily and saline nasal irrigation (AYR is a common brand).  Use Flonase spray after Afrin and saline irrigation.  Stop all antihistamines for now, and other non-prescription cough/cold preparations.   Follow-up with family doctor if not improving 7 to 10 days.

## 2013-04-18 NOTE — ED Notes (Signed)
Bed: RCV8 Expected date:  Expected time:  Means of arrival:  Comments: DOT

## 2013-08-01 ENCOUNTER — Ambulatory Visit (HOSPITAL_BASED_OUTPATIENT_CLINIC_OR_DEPARTMENT_OTHER)
Admission: RE | Admit: 2013-08-01 | Discharge: 2013-08-01 | Disposition: A | Discharge status: Home / Self Care | Payer: BC Managed Care – PPO | Source: Ambulatory Visit | Attending: Internal Medicine | Admitting: Internal Medicine

## 2013-08-01 ENCOUNTER — Ambulatory Visit (INDEPENDENT_AMBULATORY_CARE_PROVIDER_SITE_OTHER): Payer: BC Managed Care – PPO | Admitting: Internal Medicine

## 2013-08-01 VITALS — BP 125/78 | HR 65 | Resp 16 | Ht 65.0 in | Wt 157.0 lb

## 2013-08-01 DIAGNOSIS — D869 Sarcoidosis, unspecified: Secondary | ICD-10-CM | POA: Insufficient documentation

## 2013-08-01 DIAGNOSIS — R309 Painful micturition, unspecified: Secondary | ICD-10-CM

## 2013-08-01 DIAGNOSIS — Z760 Encounter for issue of repeat prescription: Secondary | ICD-10-CM

## 2013-08-01 DIAGNOSIS — D860 Sarcoidosis of lung: Secondary | ICD-10-CM

## 2013-08-01 DIAGNOSIS — J45909 Unspecified asthma, uncomplicated: Secondary | ICD-10-CM | POA: Insufficient documentation

## 2013-08-01 DIAGNOSIS — R300 Dysuria: Secondary | ICD-10-CM

## 2013-08-01 DIAGNOSIS — J4540 Moderate persistent asthma, uncomplicated: Secondary | ICD-10-CM

## 2013-08-01 DIAGNOSIS — I10 Essential (primary) hypertension: Secondary | ICD-10-CM

## 2013-08-01 DIAGNOSIS — J99 Respiratory disorders in diseases classified elsewhere: Secondary | ICD-10-CM

## 2013-08-01 LAB — POCT URINALYSIS DIPSTICK
BILIRUBIN UA: NEGATIVE
Glucose, UA: NEGATIVE
Ketones, UA: NEGATIVE
NITRITE UA: NEGATIVE
Protein, UA: NEGATIVE
Spec Grav, UA: 1.01
Urobilinogen, UA: 0.2
pH, UA: 5

## 2013-08-01 MED ORDER — MOMETASONE FURO-FORMOTEROL FUM 200-5 MCG/ACT IN AERO
2.0000 | INHALATION_SPRAY | Freq: Two times a day (BID) | RESPIRATORY_TRACT | Status: DC
Start: 2013-08-01 — End: 2013-10-03

## 2013-08-01 MED ORDER — CIPROFLOXACIN HCL 500 MG PO TABS: 500.0000 mg | ORAL_TABLET | Freq: Two times a day (BID) | ORAL | Status: DC

## 2013-08-01 MED ORDER — LOSARTAN POTASSIUM 50 MG PO TABS: 50.0000 mg | ORAL_TABLET | Freq: Every day | ORAL | Status: DC

## 2013-08-01 MED ORDER — BISOPROLOL FUMARATE 5 MG PO TABS: ORAL_TABLET | ORAL | Status: DC

## 2013-08-01 MED ORDER — METHYLPREDNISOLONE ACETATE 80 MG/ML IJ SUSP
160.0000 mg | Freq: Once | INTRAMUSCULAR | Status: AC
  Administered 2013-08-01: 160 mg via INTRAMUSCULAR

## 2013-08-01 MED ORDER — PREDNISONE 20 MG PO TABS: ORAL_TABLET | ORAL | Status: DC

## 2013-08-01 NOTE — Progress Notes (Signed)
Subjective:    Patient ID: Casey Rowe, female    DOB: [birth year], 63 y.o.   MRN: [id number]  HPI  Casey Rowe is here for follow up  See ENT  Dr. Laurance Flatten  note she underwent allergy testing and very positive for ragweed and a few molds/fungus .  She does not want to go back to ENT.  She uses Dulera intermittantly  She last used albuterol  1 week ago.  Peak flow 350 today   She has burning on urination   No CVA tenderness no nausea  Allergies  Allergen Reactions  . Demerol Nausea And Vomiting   Past Medical History  Diagnosis Date  . Broken jaw 1980s  . Tubal pregnancy 1981  . Sarcoidosis   . Anxiety   . Depression   . Hypertension   . Asthma   . Allergy    Past Surgical History  Procedure Laterality Date  . Fracture surgery  1980s    broken jaw  . Ectopic pregnancy surgery  1981  . Cesarean section    . Abdominal hysterectomy     History   Social History  . Marital Status: Married    Spouse Name: N/A    Number of Children: N/A  . Years of Education: N/A   Occupational History  . Not on file.   Social History Main Topics  . Smoking status: Never Smoker   . Smokeless tobacco: Never Used  . Alcohol Use: Yes  . Drug Use: No  . Sexual Activity: Yes   Other Topics Concern  . 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 Hypertension Mother   . Dementia Mother   . Diabetes Mother   . Diabetes Father   . Heart attack Father   . Heart disease Father   . Psoriasis Father   . Cancer Maternal Aunt     breast   Patient Active Problem List   Diagnosis Date Noted  . Asthma exacerbation 02/22/2012  . Laryngitis 02/22/2012  . PTSD (post-traumatic stress disorder) 10/19/2011  . Atypical chest pain 02/10/2011  . Anxiety   . Depression   . Hypertension   . Extrinsic asthma   . Sarcoidosis   . Allergy   . Allergic sinusitis  01/18/2011  . Diarrhea 10/06/2010   Current Outpatient Prescriptions on File Prior to Visit  Medication Sig Dispense Refill  . ALPRAZolam (XANAX) 0.25 MG tablet Take 0.25 mg by mouth 3 (three) times daily as needed.      Marland Kitchen amoxicillin (AMOXIL) 875 MG tablet Take 1 tablet (875 mg total) by mouth 2 (two) times daily.  20 tablet  0  . benzonatate (TESSALON) 200 MG capsule Take 1 capsule (200 mg total) by mouth at bedtime. Take as needed for cough  12 capsule  0  . bisoprolol (ZEBETA) 5 MG tablet Take one tablet bid  180 tablet  1  . buPROPion (WELLBUTRIN XL) 300 MG 24 hr tablet Take 1 tablet (300 mg total) by mouth daily.  90 tablet  1  . estrogen-methylTESTOSTERone (ESTRATEST) 1.25-2.5 MG per tablet Take 1 tablet by mouth daily.        . fluticasone (FLONASE) 50 MCG/ACT nasal spray Place 1 spray into both nostrils daily.  16 g  0  . guaiFENesin (MUCINEX) 600 MG  12 hr tablet Take 2 tablets (1,200 mg total) by mouth 2 (two) times daily. (Take with at least 8oz water)  40 tablet  1  . losartan (COZAAR) 50 MG tablet Take 1 tablet (50 mg total) by mouth daily.  60 tablet  2  . mometasone-formoterol (DULERA) 200-5 MCG/ACT AERO Inhale 2 puffs into the lungs 2 (two) times daily.  1 Inhaler  2  . predniSONE (DELTASONE) 20 MG tablet Take 1 tablet (20 mg total) by mouth 2 (two) times daily. Take with food.  10 tablet  0   No current facility-administered medications on file prior to visit.      Review of Systems See HPI    Objective:   Physical Exam Physical Exam  Nursing note and vitals reviewed.   Peak flow 350 Constitutional: She is oriented to person, place, and time. She appears well-developed and well-nourished.  HENT:  Head: Normocephalic and atraumatic.  Cardiovascular: Normal rate and regular rhythm. Exam reveals no gallop and no friction rub.  No murmur heard.  Pulmonary/Chest: Breath sounds normal. She has no wheezes. She has no rales.  Abd:  No CVA tenderness   Neurological: She is  alert and oriented to person, place, and time.  Skin: Skin is warm and dry.  Psychiatric: She has a normal mood and affect. Her behavior is normal.        Assessment & Plan:  Allergic rhinitis  On flonase  OTC antihistamine of choice  Asthma/ sarcoidosis of lung:  Will get CXR today.  Depomedrol  160 mg in office and prednisone 40 mg taper .  Take Dulera 200/5 daily .  See me in 4 weeks if doing well will reduce dose of Dulera  UTI  Will give cipro 500 mg for 5 days.    See me in 4 weeks

## 2013-08-01 NOTE — Addendum Note (Signed)
Addended by: Gretchen Short on: 08/01/2013 04:33 PM   Modules accepted: Orders

## 2013-08-01 NOTE — Patient Instructions (Addendum)
To CXR today  Take dulera twice a day  Take prednisone as ordered  Take antibiotic as ordered    See me in 4 weeks  30 mins

## 2013-08-04 LAB — CULTURE, URINE COMPREHENSIVE: Colony Count: >=100000

## 2013-08-06 ENCOUNTER — Telehealth: Payer: Self-pay | Admitting: *Deleted

## 2013-08-06 NOTE — Telephone Encounter (Signed)
Called pt to adv finish abx and f/u as scheduled in July - pt expressed understanding.

## 2013-08-06 NOTE — Telephone Encounter (Signed)
Message copied by Gretchen Short on Mon Aug 06, 2013 10:26 AM ------      Message from: Emi Belfast D      Created: Sun Aug 05, 2013  6:38 PM       mandy call pt and let her know she did grow E coli in her urine.  Advise to be sure to finish antibiotic.            Her CXR shows signs of sarcoidosis.   Advise her to be sure to follow up with me in July ------

## 2013-08-21 ENCOUNTER — Other Ambulatory Visit: Payer: Self-pay | Admitting: Internal Medicine

## 2013-08-22 NOTE — Telephone Encounter (Signed)
Requested Medications     Medication name:  Name from pharmacy:  bisoprolol (ZEBETA) 5 MG tablet  BISOPROLOL FUMARATE 5MG  TAB    The source prescription has been discontinued.    Sig: TAKE 1 TABLET (5 MG TOTAL) BY MOUTH DAILY.    Dispense: 90 tablet Refills: 0 Start: 08/21/2013  Class: Normal    Notes to pharmacy: No refills available    Requested on: 01/29/2013    Originally ordered on: 10/05/2010 Last refill: 05/15/2013 Order History and Details

## 2013-08-29 ENCOUNTER — Ambulatory Visit (INDEPENDENT_AMBULATORY_CARE_PROVIDER_SITE_OTHER): Payer: BC Managed Care – PPO | Admitting: Internal Medicine

## 2013-08-29 ENCOUNTER — Encounter: Payer: Self-pay | Admitting: Internal Medicine

## 2013-08-29 VITALS — BP 138/88 | HR 71 | Temp 97.1°F | Ht 64.0 in | Wt 149.0 lb

## 2013-08-29 DIAGNOSIS — D869 Sarcoidosis, unspecified: Secondary | ICD-10-CM

## 2013-08-29 DIAGNOSIS — R634 Abnormal weight loss: Secondary | ICD-10-CM

## 2013-08-29 NOTE — Patient Instructions (Signed)
To lab today  See me in 4 weeks  30 mins    Referral to Dr. Elsworth Soho pulmonologist

## 2013-08-30 ENCOUNTER — Encounter: Payer: Self-pay | Admitting: Internal Medicine

## 2013-08-30 LAB — COMPLETE METABOLIC PANEL WITH GFR
ALT: 23 U/L (ref 0–35)
AST: 20 U/L (ref 0–37)
Albumin: 4.2 g/dL (ref 3.5–5.2)
Alkaline Phosphatase: 51 U/L (ref 39–117)
BILIRUBIN TOTAL: 0.6 mg/dL (ref 0.2–1.2)
BUN: 12 mg/dL (ref 6–23)
CALCIUM: 9.6 mg/dL (ref 8.4–10.5)
CO2: 27 mEq/L (ref 19–32)
CREATININE: 0.69 mg/dL (ref 0.50–1.10)
Chloride: 103 mEq/L (ref 96–112)
GLUCOSE: 71 mg/dL (ref 70–99)
Potassium: 4.3 mEq/L (ref 3.5–5.3)
Sodium: 137 mEq/L (ref 135–145)
Total Protein: 6.7 g/dL (ref 6.0–8.3)

## 2013-08-30 LAB — TSH: TSH: 1.6 u[IU]/mL (ref 0.350–4.500)

## 2013-08-30 LAB — CA 125: CA 125: 10 U/mL (ref 0.0–30.2)

## 2013-08-30 LAB — CEA: CEA: <0.5 ng/mL (ref 0.0–5.0)

## 2013-08-30 NOTE — Progress Notes (Signed)
Subjective:    Patient ID: Casey Rowe, female    DOB: 1950-09-19, 63 y.o.   MRN: [id number]  HPI  Casey Rowe is here to follow up on Ecoli UTI and bronchospasm.   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   NOt using inhalers now  E coli Uti she finished course of Cipro and is asymptomatic  She is concerned about weight loss  She has lost 8# since 6/24  Tells me she exercises but not trying to lose  Apppetite is good  NO FH of cancers  She is a non-smoker  Allergies  Allergen Reactions  . Demerol Nausea And Vomiting   Past Medical History  Diagnosis Date  . Broken jaw 1980s  . Tubal pregnancy 1981  . Sarcoidosis   . Anxiety   . Depression   . Hypertension   . Asthma   . Allergy    Past Surgical History  Procedure Laterality Date  . Fracture surgery  1980s    broken jaw  . Ectopic pregnancy surgery  1981  . Cesarean section    . Abdominal hysterectomy     History   Social History  . Marital Status: Married    Spouse Name: N/A    Number of Children: N/A  . Years of Education: N/A   Occupational History  . Not on file.   Social History Main Topics  . Smoking status: Never Smoker   . Smokeless tobacco: Never Used  . Alcohol Use: Yes  . Drug Use: No  . Sexual Activity: Yes   Other Topics Concern  . 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 Hypertension Mother   . Dementia Mother   . Diabetes Mother   . Diabetes Father   . Heart attack Father   . Heart disease Father   . Psoriasis Father   .  Cancer Maternal Aunt     breast   Patient Active Problem List   Diagnosis Date Noted  . Asthma exacerbation 02/22/2012  . Laryngitis 02/22/2012  . PTSD (post-traumatic stress disorder) 10/19/2011  . Atypical chest pain 02/10/2011  . Anxiety   . Depression   . Hypertension   . Extrinsic asthma   . Sarcoidosis   . Allergy   . Allergic sinusitis 01/18/2011  . Diarrhea 10/06/2010   Current Outpatient Prescriptions on File Prior to Visit  Medication Sig Dispense Refill  . ALPRAZolam (XANAX) 0.25 MG tablet Take 0.25 mg by mouth 3 (three) times daily as needed.      . bisoprolol (ZEBETA) 5 MG tablet Take one tablet bid  180 tablet  1  . buPROPion (WELLBUTRIN XL) 300 MG 24 hr tablet Take 1 tablet (300 mg total) by mouth daily.  90 tablet  1  . estrogen-methylTESTOSTERone (ESTRATEST) 1.25-2.5 MG per tablet Take 1 tablet by mouth daily.        . fluticasone (FLONASE)  50 MCG/ACT nasal spray Place 1 spray into both nostrils daily.  16 g  0  . guaiFENesin (MUCINEX) 600 MG 12 hr tablet Take 2 tablets (1,200 mg total) by mouth 2 (two) times daily. (Take with at least 8oz water)  40 tablet  1  . losartan (COZAAR) 50 MG tablet Take 1 tablet (50 mg total) by mouth daily.  60 tablet  2  . mometasone-formoterol (DULERA) 200-5 MCG/ACT AERO Inhale 2 puffs into the lungs 2 (two) times daily.  1 Inhaler  2   No current facility-administered medications on file prior to visit.      Review of Systems See HPI    Objective:   Physical Exam Physical Exam  Nursing note and vitals reviewed.  Constitutional: She is oriented to person, place, and time. She appears well-developed and well-nourished.  HENT:  Head: Normocephalic and atraumatic.  Cardiovascular: Normal rate and regular rhythm. Exam reveals no gallop and no friction rub.  No murmur heard.  Pulmonary/Chest: Breath sounds normal. She has no wheezes. She has no rales.  Neurological: She is alert and oriented to person, place, and time.  Skin:  Skin is warm and dry.  Psychiatric: She has a normal mood and affect. Her behavior is normal.          Assessment & Plan:  Bronchospasm in setting of sarcoid:  Will establish relationship with pulmonary  .  Refer To Dr. Elsworth Soho  Off  Inhalers now.  She has had allergy testing by ENT Dr. Laurance Flatten in [place] but does not wish to return to him.  Question if exposure to old building during school year. exacerbating condition  Ecoli UTI  Finished abx  Will get repeat U/A next visit  Weight loss  eitiology unclear .  CXR no opacities   Get labs, TSH.  Recheck 4 weeks  If still losing weight will need to consider imaging.    See me in 4 weeks

## 2013-09-06 ENCOUNTER — Other Ambulatory Visit: Payer: Self-pay | Admitting: Internal Medicine

## 2013-09-06 DIAGNOSIS — Z760 Encounter for issue of repeat prescription: Secondary | ICD-10-CM

## 2013-09-06 MED ORDER — BISOPROLOL FUMARATE 5 MG PO TABS: ORAL_TABLET | ORAL | Status: DC

## 2013-09-06 MED ORDER — LOSARTAN POTASSIUM 50 MG PO TABS: ORAL_TABLET | ORAL | Status: DC

## 2013-09-26 ENCOUNTER — Ambulatory Visit: Payer: BC Managed Care – PPO | Admitting: Internal Medicine

## 2013-10-03 ENCOUNTER — Ambulatory Visit (INDEPENDENT_AMBULATORY_CARE_PROVIDER_SITE_OTHER): Payer: BC Managed Care – PPO | Admitting: Internal Medicine

## 2013-10-03 ENCOUNTER — Encounter: Payer: Self-pay | Admitting: Internal Medicine

## 2013-10-03 VITALS — BP 124/66 | HR 80 | Resp 16 | Ht 64.0 in | Wt 148.0 lb

## 2013-10-03 DIAGNOSIS — J45909 Unspecified asthma, uncomplicated: Secondary | ICD-10-CM | POA: Diagnosis not present

## 2013-10-03 DIAGNOSIS — J029 Acute pharyngitis, unspecified: Secondary | ICD-10-CM | POA: Diagnosis not present

## 2013-10-03 DIAGNOSIS — R634 Abnormal weight loss: Secondary | ICD-10-CM | POA: Diagnosis not present

## 2013-10-03 DIAGNOSIS — J028 Acute pharyngitis due to other specified organisms: Secondary | ICD-10-CM

## 2013-10-03 MED ORDER — FLUTICASONE-SALMETEROL 100-50 MCG/DOSE IN AEPB: 1.0000 | INHALATION_SPRAY | Freq: Two times a day (BID) | RESPIRATORY_TRACT | Status: DC

## 2013-10-03 MED ORDER — AZITHROMYCIN 250 MG PO TABS: ORAL_TABLET | ORAL | Status: DC

## 2013-10-03 NOTE — Progress Notes (Signed)
Subjective:    Patient ID: Casey Rowe, female    DOB: February 22, 1950, 63 y.o.   MRN: [id number]  HPI  Kem is here for acute visit and follow up on weight loss and sarcoidosis  Wt loss she has only lost 1 lb since last visit. Her tumor markers and TSH are neg  Sarcoidosis  She cancelled her apppt with pulmonologist due to finances.    She began new school year with sore throat and hoarseness.  She has left ear pain as well no cough no fever no wheezing   She tells me the [facility] also make her hoarse  She is on 200/5 dose she would like to change to ADvair  Allergies  Allergen Reactions  . Demerol Nausea And Vomiting   Past Medical History  Diagnosis Date  . Broken jaw 1980s  . Tubal pregnancy 1981  . Sarcoidosis   . Anxiety   . Depression   . Hypertension   . Asthma   . Allergy    Past Surgical History  Procedure Laterality Date  . Fracture surgery  1980s    broken jaw  . Ectopic pregnancy surgery  1981  . Cesarean section    . Abdominal hysterectomy     History   Social History  . Marital Status: Married    Spouse Name: N/A    Number of Children: N/A  . Years of Education: N/A   Occupational History  . Not on file.   Social History Main Topics  . Smoking status: Never Smoker   . Smokeless tobacco: Never Used  . Alcohol Use: Yes  . Drug Use: No  . Sexual Activity: Yes   Other Topics Concern  . 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 Hypertension Mother   . Dementia Mother   . Diabetes Mother   . Diabetes Father   . Heart attack Father   . Heart disease Father   . Psoriasis Father   . Cancer Maternal Aunt     breast   Patient Active Problem List   Diagnosis Date Noted  . Asthma exacerbation 02/22/2012  . Laryngitis 02/22/2012  . PTSD (post-traumatic stress disorder) 10/19/2011  . Atypical chest pain 02/10/2011   . Anxiety   . Depression   . Hypertension   . Extrinsic asthma   . Sarcoidosis   . Allergy   . Allergic sinusitis 01/18/2011  . Diarrhea 10/06/2010   Current Outpatient Prescriptions on File Prior to Visit  Medication Sig Dispense Refill  . ALPRAZolam (XANAX) 0.25 MG tablet Take 0.25 mg by mouth 3 (three) times daily as needed.      . bisoprolol (ZEBETA) 5 MG tablet Take one tablet daily  90 tablet  1  . buPROPion (WELLBUTRIN XL) 300 MG 24 hr tablet Take 1 tablet (300 mg total) by mouth daily.  90 tablet  1  . estrogen-methylTESTOSTERone (ESTRATEST) 1.25-2.5 MG per tablet Take 1 tablet by mouth daily.        . fluticasone (FLONASE) 50 MCG/ACT nasal spray Place 1 spray into both nostrils daily.  16 g  0  . guaiFENesin (MUCINEX) 600 MG 12 hr tablet Take 2 tablets (1,200 mg total) by mouth 2 (two) times daily. (Take with at least 8oz water)  40 tablet  1  .  losartan (COZAAR) 50 MG tablet Take one bid  180 tablet  1   No current facility-administered medications on file prior to visit.      Review of Systems See HPI    Objective:   Physical Exam Physical Exam  Constitutional: She is oriented to person, place, and time. She appears well-developed and well-nourished. She is cooperative.  HENT:  Head: Normocephalic and atraumatic.  Right Ear: A middle ear effusion is present.  Left Ear: A middle ear effusion is present.  Nose: Mucosal edema present.  Mouth/Throat: Oropharyngeal exudate and posterior oropharyngeal erythema present.  Serous effusion bilaterally  Eyes: Conjunctivae and EOM are normal. Pupils are equal, round, and reactive to light.  Neck: Neck supple. Carotid bruit is not present. No mass present.  Cardiovascular: Regular rhythm, normal heart sounds, intact distal pulses and normal pulses. Exam reveals no gallop and no friction rub.  No murmur heard.  Pulmonary/Chest: Breath sounds normal. She has no wheezes. She has no rhonchi. She has no rales.  Lymphadenopathy:    She has cervical adenopathy.  Neurological: She is alert and oriented to person, place, and time.  Skin: Skin is warm and dry. No abrasion, no bruising, no ecchymosis and no rash noted. No cyanosis. Nails show no clubbing.  Psychiatric: She has a normal mood and affect. Her speech is normal and behavior is normal.            Assessment & Plan:  pharyntitis  Ok for Z-pak  Asthma  Will D/C 200 /5 dose of dulera OK to take Advair 100/50  One inhalation bid  Wt loss  Only change in 1 lb  Pt counseled she must keep monthly appt with me so I can see if trend of wt loss continues.  If she continues to lose weight I counseled pt she will need CT imaging she voices understanding  Recent UTI   U/A neg today

## 2013-10-04 LAB — POCT URINALYSIS DIPSTICK
Bilirubin, UA: NEGATIVE
Blood, UA: NEGATIVE
GLUCOSE UA: NEGATIVE
Ketones, UA: NEGATIVE
LEUKOCYTES UA: NEGATIVE
Nitrite, UA: NEGATIVE
PROTEIN UA: NEGATIVE
Spec Grav, UA: 1.01
UROBILINOGEN UA: 0.2
pH, UA: 5

## 2013-10-04 NOTE — Addendum Note (Signed)
Addended by: Gretchen Short on: 10/04/2013 01:50 PM   Modules accepted: Orders

## 2013-10-24 ENCOUNTER — Telehealth: Payer: Self-pay

## 2013-10-24 NOTE — Telephone Encounter (Signed)
I spoke with Arbie Cookey and let her know that we do not give ABX without seeing the patient. She was advised to go urgent care after she gets off work. -eh

## 2013-10-24 NOTE — Telephone Encounter (Signed)
I do not give antibiotics over phone  Advise her to to go urgent care as my schedule is full

## 2013-10-24 NOTE — Telephone Encounter (Signed)
Casey Rowe [phone]  [name] called she has got laryngitis again, she was here on 10/03/13 with same thing. She wanted to see if you could call her in a zpack, it worked good last time. 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

## 2013-10-24 NOTE — Telephone Encounter (Signed)
Pt requesting a Z-pak. See Ozzie Hoyle note. Let me know your policy on this please-eh

## 2013-11-07 ENCOUNTER — Encounter: Payer: Self-pay | Admitting: Internal Medicine

## 2013-11-07 ENCOUNTER — Ambulatory Visit (INDEPENDENT_AMBULATORY_CARE_PROVIDER_SITE_OTHER): Payer: BC Managed Care – PPO | Admitting: Internal Medicine

## 2013-11-07 VITALS — BP 139/76 | HR 67 | Temp 98.3°F | Resp 16 | Ht 64.0 in | Wt 146.0 lb

## 2013-11-07 DIAGNOSIS — R634 Abnormal weight loss: Secondary | ICD-10-CM

## 2013-11-07 DIAGNOSIS — J040 Acute laryngitis: Secondary | ICD-10-CM | POA: Diagnosis not present

## 2013-11-07 HISTORY — DX: Abnormal weight loss: R63.4

## 2013-11-07 NOTE — Progress Notes (Signed)
Subjective:    Patient ID: Casey Rowe, female    DOB: March 23, 1950, 63 y.o.   MRN: [id number]  HPI  Prior visit  pharyntitis Ok for Z-pak  Asthma Will D/C 200 /5 dose of dulera OK to take Advair 100/50 One inhalation bid  Wt loss Only change in 1 lb Pt counseled she must keep monthly appt with me so I can see if trend of wt loss continues. If she continues to lose weight I counseled pt she will need CT imaging she voices understanding  Recent UTI U/A neg today   Kaylie is here for follow up  Of weight and hoarseness.    She has been evaluated by Dr. Laurance Flatten ENT for her hoarseness.   He did recommend IT but pt declined this. 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  She is down another 2 lbs since August.  She tells me she is exercising and watching her diet.   See CT abd/pelvis of 2012  I reveiwed report extensively with pt in office  She is on low dose ADvair now and will occasionally wheeze when in the school building   Allergies  Allergen Reactions  . Demerol Nausea And Vomiting   Past Medical History  Diagnosis Date  . Broken jaw 1980s  . Tubal pregnancy 1981  . Sarcoidosis   . Anxiety   . Depression   . Hypertension   . Asthma   . Allergy    Past Surgical History  Procedure Laterality Date  . Fracture surgery  1980s    broken jaw  . Ectopic pregnancy surgery  1981  . Cesarean section    . Abdominal hysterectomy     History   Social History  . Marital Status: Married    Spouse Name: N/A    Number of Children: N/A  . Years of Education: N/A   Occupational History  . Not on file.   Social History Main Topics  . Smoking status: Never Smoker   . Smokeless tobacco: Never Used  . Alcohol Use: Yes  . Drug Use: No  . Sexual Activity: Yes   Other Topics Concern  . 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 Hypertension Mother   . Dementia Mother   . Diabetes Mother   . Diabetes Father   . Heart attack Father   . Heart disease Father   . Psoriasis Father   . Cancer Maternal Aunt     breast   Patient Active Problem List   Diagnosis Date Noted  . Asthma exacerbation 02/22/2012  . Laryngitis 02/22/2012  . PTSD (post-traumatic stress disorder) 10/19/2011  . Atypical chest pain 02/10/2011  . Anxiety   . Depression   . Hypertension   . Extrinsic asthma   . Sarcoidosis   . Allergy   . Allergic sinusitis 01/18/2011  . Diarrhea 10/06/2010   Current Outpatient Prescriptions on File Prior to Visit  Medication Sig Dispense Refill  . ALPRAZolam (XANAX) 0.25 MG tablet Take 0.25 mg by mouth 3 (three) times daily as needed.      . bisoprolol (ZEBETA) 5 MG tablet Take one tablet daily  90 tablet  1  .  buPROPion (WELLBUTRIN XL) 300 MG 24 hr tablet Take 1 tablet (300 mg total) by mouth daily.  90 tablet  1  . estrogen-methylTESTOSTERone (ESTRATEST) 1.25-2.5 MG per tablet Take 1 tablet by mouth daily.        . fluticasone (FLONASE) 50 MCG/ACT nasal spray Place 1 spray into both nostrils daily.  16 g  0  . Fluticasone-Salmeterol (ADVAIR) 100-50 MCG/DOSE AEPB Inhale 1 puff into the lungs 2 (two) times daily.  1 each  3  . losartan (COZAAR) 50 MG tablet Take one bid  180 tablet  1   No current facility-administered medications on file prior to visit.      Review of Systems See HPI    Objective:   Physical Exam  Physical Exam  Nursing note and vitals reviewed.  Constitutional: She is oriented to person, place, and time. She appears well-developed and well-nourished.  HENT:  Head: Normocephalic and atraumatic.  Cardiovascular: Normal rate and regular rhythm. Exam reveals no gallop and no friction rub.  No murmur heard.  Pulmonary/Chest: Breath sounds normal. She has no wheezes. She has no rales.  Good air movement  Neurological: She  is alert and oriented to person, place, and time.  Skin: Skin is warm and dry.  Psychiatric: She has a normal mood and affect. Her behavior is normal.         Assessment & Plan:  Chronic hoarseness  Advised to keep follow up with ENT    Weight loss  I strongly advised Ct evaluation. but pt declines saying she does not have the money for it.  I did manage to have her see me again in 2 months and if continued wt loss will again advise imaging. CEA and CA125 are normal  MM neg 01/2013, she is S/P hysterectomy  .   She voices understanding.  Distant aunt had breast cancer otherwise no FH of malignancy .   Ct 2012 bladder thickened vs underdistended  Pt very aware of this.

## 2013-12-10 ENCOUNTER — Encounter: Payer: Self-pay | Admitting: Internal Medicine

## 2014-01-30 ENCOUNTER — Other Ambulatory Visit (HOSPITAL_BASED_OUTPATIENT_CLINIC_OR_DEPARTMENT_OTHER): Payer: Self-pay | Admitting: Unknown Physician Specialty

## 2014-01-30 ENCOUNTER — Other Ambulatory Visit: Payer: Self-pay | Admitting: Internal Medicine

## 2014-01-30 ENCOUNTER — Encounter: Payer: Self-pay | Admitting: Internal Medicine

## 2014-01-30 ENCOUNTER — Ambulatory Visit (INDEPENDENT_AMBULATORY_CARE_PROVIDER_SITE_OTHER): Payer: BC Managed Care – PPO | Admitting: Internal Medicine

## 2014-01-30 ENCOUNTER — Ambulatory Visit: Payer: BC Managed Care – PPO | Admitting: Internal Medicine

## 2014-01-30 VITALS — BP 114/74 | HR 64 | Resp 16 | Ht 64.0 in | Wt 137.0 lb

## 2014-01-30 DIAGNOSIS — Z23 Encounter for immunization: Secondary | ICD-10-CM

## 2014-01-30 DIAGNOSIS — Z1231 Encounter for screening mammogram for malignant neoplasm of breast: Secondary | ICD-10-CM

## 2014-01-30 DIAGNOSIS — R634 Abnormal weight loss: Secondary | ICD-10-CM

## 2014-01-30 MED ORDER — DOXYCYCLINE HYCLATE 100 MG PO TABS: 100.0000 mg | ORAL_TABLET | Freq: Two times a day (BID) | ORAL | Status: DC

## 2014-01-30 NOTE — Progress Notes (Signed)
Subjective:    Patient ID: Casey Rowe, female    DOB: [birth year], 63 y.o.   MRN: [id number]  HPI  Casey Rowe is here for follow up weight loss.  She repeatedly states her wt loss is intentional.  She rides bike at [place] [place] 30 mins or more and has changed her diet.   She has lost 12 lbs since July   Husband had severe staph infection of lips.  She now has crusty lesions on her mouth as well  Sister recently diagnosed with pancreatic cancer  Allergies  Allergen Reactions  . Demerol Nausea And Vomiting   Past Medical History  Diagnosis Date  . Broken jaw 1980s  . Tubal pregnancy 1981  . Sarcoidosis   . Anxiety   . Depression   . Hypertension   . Asthma   . Allergy    Past Surgical History  Procedure Laterality Date  . Fracture surgery  1980s    broken jaw  . Ectopic pregnancy surgery  1981  . Cesarean section    . Abdominal hysterectomy     History   Social History  . Marital Status: Married    Spouse Name: N/A    Number of Children: N/A  . Years of Education: N/A   Occupational History  . Not on file.   Social History Main Topics  . Smoking status: Never Smoker   . Smokeless tobacco: Never Used  . Alcohol Use: Yes  . Drug Use: No  . Sexual Activity: Yes   Other Topics Concern  . 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 Hypertension Mother   . Dementia Mother   . Diabetes Mother   . Diabetes Father   . Heart attack Father   . Heart disease Father   . Psoriasis Father   . Cancer Maternal Aunt     breast   Patient Active Problem List   Diagnosis Date Noted  . Weight loss 11/07/2013  . Asthma exacerbation 02/22/2012  . Laryngitis 02/22/2012  . PTSD (post-traumatic stress disorder) 10/19/2011  . Atypical chest pain 02/10/2011  . Anxiety   . Depression   . Hypertension   . Extrinsic asthma   . Sarcoidosis   .  Allergy   . Allergic sinusitis 01/18/2011  . Diarrhea 10/06/2010   Current Outpatient Prescriptions on File Prior to Visit  Medication Sig Dispense Refill  . ALPRAZolam (XANAX) 0.25 MG tablet Take 0.25 mg by mouth 3 (three) times daily as needed.    . bisoprolol (ZEBETA) 5 MG tablet Take one tablet daily 90 tablet 1  . buPROPion (WELLBUTRIN XL) 300 MG 24 hr tablet Take 1 tablet (300 mg total) by mouth daily. 90 tablet 1  . estrogen-methylTESTOSTERone (ESTRATEST) 1.25-2.5 MG per tablet Take 1 tablet by mouth daily.      . fluticasone (FLONASE) 50 MCG/ACT nasal spray Place 1 spray into both nostrils daily. 16 g 0  . Fluticasone-Salmeterol (ADVAIR) 100-50 MCG/DOSE AEPB Inhale 1 puff into the lungs 2 (two) times daily. 1 each 3  . losartan (COZAAR) 50 MG tablet Take one bid 180 tablet 1   No current facility-administered medications on file prior to visit.      Review of Systems See HPI     Objective:   Physical Exam  Physical Exam  Nursing note and vitals reviewed.  Constitutional: She is oriented to person, place, and time. She appears well-developed and well-nourished.  HENT:  Head: Normocephalic and atraumatic.  Cardiovascular: Normal rate and regular rhythm. Exam reveals no gallop and no friction rub.  No murmur heard.  Pulmonary/Chest: Breath sounds normal. She has no wheezes. She has no rales.  Neurological: She is alert and oriented to person, place, and time.  Skin: Skin is warm and dry.  Psychiatric: She has a normal mood and affect. Her behavior is normal.            Assessment & Plan:  Intentional weight loss  I counseled pt her BMI is in normal range.  Counseled to get her mm.  CEA and CA 125 normal .  Will see pt in 4-5 weeks  If continued wt loss I  counseled pt she will need xray  imaging  But she repeatedly declines this   Probable staph infection  Will give course of doxycycline empirically  See me 4-5 weeks  Fh pancreatic ca  Pt

## 2014-01-30 NOTE — Patient Instructions (Signed)
See me 4-5 weeks

## 2014-02-05 ENCOUNTER — Other Ambulatory Visit: Payer: Self-pay | Admitting: *Deleted

## 2014-02-05 DIAGNOSIS — Z0000 Encounter for general adult medical examination without abnormal findings: Secondary | ICD-10-CM

## 2014-02-05 DIAGNOSIS — I10 Essential (primary) hypertension: Secondary | ICD-10-CM

## 2014-02-11 ENCOUNTER — Ambulatory Visit (INDEPENDENT_AMBULATORY_CARE_PROVIDER_SITE_OTHER): Payer: BC Managed Care – PPO | Admitting: Internal Medicine

## 2014-02-11 ENCOUNTER — Encounter: Payer: Self-pay | Admitting: Internal Medicine

## 2014-02-11 ENCOUNTER — Ambulatory Visit (HOSPITAL_BASED_OUTPATIENT_CLINIC_OR_DEPARTMENT_OTHER)
Admission: RE | Admit: 2014-02-11 | Discharge: 2014-02-11 | Disposition: A | Discharge status: Home / Self Care | Payer: BC Managed Care – PPO | Source: Ambulatory Visit | Attending: Internal Medicine | Admitting: Internal Medicine

## 2014-02-11 VITALS — BP 121/78 | HR 58 | Resp 16 | Ht 64.0 in | Wt 136.0 lb

## 2014-02-11 DIAGNOSIS — J4520 Mild intermittent asthma, uncomplicated: Secondary | ICD-10-CM

## 2014-02-11 DIAGNOSIS — Z1231 Encounter for screening mammogram for malignant neoplasm of breast: Secondary | ICD-10-CM | POA: Insufficient documentation

## 2014-02-11 DIAGNOSIS — Z0000 Encounter for general adult medical examination without abnormal findings: Secondary | ICD-10-CM

## 2014-02-11 DIAGNOSIS — Z1211 Encounter for screening for malignant neoplasm of colon: Secondary | ICD-10-CM

## 2014-02-11 DIAGNOSIS — D869 Sarcoidosis, unspecified: Secondary | ICD-10-CM | POA: Diagnosis not present

## 2014-02-11 DIAGNOSIS — R634 Abnormal weight loss: Secondary | ICD-10-CM | POA: Diagnosis not present

## 2014-02-11 DIAGNOSIS — I10 Essential (primary) hypertension: Secondary | ICD-10-CM

## 2014-02-11 DIAGNOSIS — Z0001 Encounter for general adult medical examination with abnormal findings: Secondary | ICD-10-CM

## 2014-02-11 LAB — CBC WITH DIFFERENTIAL/PLATELET
BASOS ABS: 0.1 10*3/uL (ref 0.0–0.1)
Basophils Relative: 2 % — ABNORMAL HIGH (ref 0–1)
EOS PCT: 7 % — AB (ref 0–5)
Eosinophils Absolute: 0.3 10*3/uL (ref 0.0–0.7)
HCT: 43 % (ref 36.0–46.0)
Hemoglobin: 14 g/dL (ref 12.0–15.0)
LYMPHS PCT: 35 % (ref 12–46)
Lymphs Abs: 1.7 10*3/uL (ref 0.7–4.0)
MCH: 32 pg (ref 26.0–34.0)
MCHC: 32.6 g/dL (ref 30.0–36.0)
MCV: 98.2 fL (ref 78.0–100.0)
MONOS PCT: 12 % (ref 3–12)
MPV: 10.3 fL (ref 8.6–12.4)
Monocytes Absolute: 0.6 10*3/uL (ref 0.1–1.0)
NEUTROS ABS: 2.1 10*3/uL (ref 1.7–7.7)
Neutrophils Relative %: 44 % (ref 43–77)
Platelets: 211 10*3/uL (ref 150–400)
RBC: 4.38 MIL/uL (ref 3.87–5.11)
RDW: 12.7 % (ref 11.5–15.5)
WBC: 4.8 10*3/uL (ref 4.0–10.5)

## 2014-02-11 LAB — POCT URINALYSIS DIPSTICK
Bilirubin, UA: NEGATIVE
Glucose, UA: NEGATIVE
Ketones, UA: NEGATIVE
Leukocytes, UA: NEGATIVE
NITRITE UA: NEGATIVE
PH UA: 6.5
Protein, UA: NEGATIVE
RBC UA: NEGATIVE
Spec Grav, UA: 1.02
Urobilinogen, UA: NEGATIVE

## 2014-02-11 LAB — COMPLETE METABOLIC PANEL WITH GFR
ALBUMIN: 3.8 g/dL (ref 3.5–5.2)
ALK PHOS: 72 U/L (ref 39–117)
ALT: 15 U/L (ref 0–35)
AST: 18 U/L (ref 0–37)
BUN: 11 mg/dL (ref 6–23)
CALCIUM: 9 mg/dL (ref 8.4–10.5)
CO2: 27 mEq/L (ref 19–32)
Chloride: 103 mEq/L (ref 96–112)
Creat: 0.72 mg/dL (ref 0.50–1.10)
GLUCOSE: 85 mg/dL (ref 70–99)
POTASSIUM: 4.7 meq/L (ref 3.5–5.3)
Sodium: 139 mEq/L (ref 135–145)
Total Bilirubin: 0.6 mg/dL (ref 0.2–1.2)
Total Protein: 6.3 g/dL (ref 6.0–8.3)

## 2014-02-11 LAB — HEMOCCULT GUIAC POC 1CARD (OFFICE)
Card #1 Date: 1042016
FECAL OCCULT BLD: NEGATIVE

## 2014-02-11 LAB — LIPID PANEL
Cholesterol: 154 mg/dL (ref 0–200)
HDL: 46 mg/dL (ref >39)
LDL Cholesterol: 88 mg/dL (ref 0–99)
Total CHOL/HDL Ratio: 3.3 Ratio
Triglycerides: 100 mg/dL (ref <150)
VLDL: 20 mg/dL (ref 0–40)

## 2014-02-11 NOTE — Patient Instructions (Signed)
See me end of march on spring break

## 2014-02-11 NOTE — Progress Notes (Signed)
Subjective:    Patient ID: Casey Rowe, female    DOB: 30-Mar-1950, 64 y.o.   MRN: [id number]  HPI   TODAY:  Pallavi is here for CPE.  Doing well on school break from [employer]   HM:  S/P hysterectomy  Dr.  Sharlet Salina vaginal pap per GYN,   Mm today  ,   Never had colonoscopy .  She is a non-smoker   Intentional wt loss   Pt still exercising and watching diet.  Has lost 21 lbs since 07/2013  BMI Now normal   Asthma  No wheezing   Sarcoidosis  Bassheva did not see pulmonologist that I recommended  HTN:  Tolerating meds    10/2013 Chronic hoarseness Advised to keep follow up with ENT   Weight loss I strongly advised Ct evaluation. but pt declines saying she does not have the money for it. I did manage to have her see me again in 2 months and if continued wt loss will again advise imaging. CEA and CA125 are normal  MM neg 01/2013, she is S/P hysterectomy . She voices understanding. Distant aunt had breast cancer otherwise no FH of malignancy . Ct 2012 bladder thickened vs underdistended Pt very aware of this.              Allergies  Allergen Reactions  . Demerol Nausea And Vomiting   Past Medical History  Diagnosis Date  . Broken jaw 1980s  . Tubal pregnancy 1981  . Sarcoidosis   . Anxiety   . Depression   . Hypertension   . Asthma   . Allergy    Past Surgical History  Procedure Laterality Date  . Fracture surgery  1980s    broken jaw  . Ectopic pregnancy surgery  1981  . Cesarean section    . Abdominal hysterectomy     History   Social History  . Marital Status: Married    Spouse Name: N/A    Number of Children: N/A  . Years of Education: N/A   Occupational History  . Not on file.   Social History Main Topics  . Smoking status: Never Smoker   . Smokeless tobacco: Never Used  . Alcohol Use: Yes  . Drug Use: No  . Sexual Activity: Yes   Other Topics Concern  . 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 Hypertension Mother   . Dementia Mother   . Diabetes Mother   . Diabetes Father   . Heart attack Father   . Heart disease Father   . Psoriasis Father   . Cancer Maternal Aunt     breast   Patient Active Problem List   Diagnosis Date Noted  . Weight loss 11/07/2013  . Asthma exacerbation 02/22/2012  . Laryngitis 02/22/2012  . PTSD (post-traumatic stress disorder) 10/19/2011  . Atypical chest pain 02/10/2011  . Anxiety   . Depression   . Hypertension   . Extrinsic asthma   . Sarcoidosis   . Allergy   . Allergic sinusitis 01/18/2011  . Diarrhea 10/06/2010   Current Outpatient Prescriptions on File Prior to Visit  Medication Sig Dispense Refill  . ALPRAZolam (XANAX) 0.25 MG tablet Take 0.25 mg by mouth 3 (three) times daily as needed.    . bisoprolol (ZEBETA)  5 MG tablet Take one tablet daily 90 tablet 1  . buPROPion (WELLBUTRIN XL) 300 MG 24 hr tablet Take 1 tablet (300 mg total) by mouth daily. 90 tablet 1  . busPIRone (BUSPAR) 5 MG tablet Take 5 mg by mouth 3 (three) times daily.    Marland Kitchen doxycycline (VIBRA-TABS) 100 MG tablet Take 1 tablet (100 mg total) by mouth 2 (two) times daily. 20 tablet 0  . estrogen-methylTESTOSTERone (ESTRATEST) 1.25-2.5 MG per tablet Take 1 tablet by mouth daily.      . fluticasone (FLONASE) 50 MCG/ACT nasal spray Place 1 spray into both nostrils daily. 16 g 0  . Fluticasone-Salmeterol (ADVAIR) 100-50 MCG/DOSE AEPB Inhale 1 puff into the lungs 2 (two) times daily. 1 each 3  . losartan (COZAAR) 50 MG tablet Take one bid 180 tablet 1   No current facility-administered medications on file prior to visit.      Review of Systems  Respiratory: Negative for cough, choking, chest tightness, shortness of breath and wheezing.   Cardiovascular: Negative for chest pain, palpitations and leg swelling.  All other systems reviewed and are negative.        Objective:   Physical Exam Physical Exam  Nursing note and vitals reviewed.  Constitutional: She is oriented to person, place, and time. She appears well-developed and well-nourished.  HENT:  Head: Normocephalic and atraumatic.  Right Ear: Tympanic membrane and ear canal normal. No drainage. Tympanic membrane is not injected and not erythematous.  Left Ear: Tympanic membrane and ear canal normal. No drainage. Tympanic membrane is not injected and not erythematous.  Nose: Nose normal. Right sinus exhibits no maxillary sinus tenderness and no frontal sinus tenderness. Left sinus exhibits no maxillary sinus tenderness and no frontal sinus tenderness.  Mouth/Throat: Oropharynx is clear and moist. No oral lesions. No oropharyngeal exudate.  Eyes: Conjunctivae and EOM are normal. Pupils are equal, round, and reactive to light.  Neck: Normal range of motion. Neck supple. No JVD present. Carotid bruit is not present. No mass and no thyromegaly present.  Cardiovascular: Normal rate, regular rhythm, S1 normal, S2 normal and intact distal pulses. Exam reveals no gallop and no friction rub.  No murmur heard.  Pulses:  Carotid pulses are 2+ on the right side, and 2+ on the left side.  Dorsalis pedis pulses are 2+ on the right side, and 2+ on the left side.  No carotid bruit. No LE edema  Pulmonary/Chest: Breath sounds normal. She has no wheezes. She has no rales. She exhibits no tenderness.   Outer quadrant of left breast has thickness at 11 o'clock no discreate mass no nipple discharge no axillary adenopathy.  R breast no nipple dischage no axillary adenopathy no discrete mass   Abdominal: Soft. Bowel sounds are normal. She exhibits no distension and no mass. There is no hepatosplenomegaly. There is no tenderness. There is no CVA tenderness.   Rectal no mass guaiac neg Musculoskeletal: Normal range of motion.  No active synovitis to joints.  Lymphadenopathy:  She has no cervical adenopathy.  She has  no axillary adenopathy.  Right: No inguinal and no supraclavicular adenopathy present.  Left: No inguinal and no supraclavicular adenopathy present.  Neurological: She is alert and oriented to person, place, and time. She has normal strength and normal reflexes. She displays no tremor. No cranial nerve deficit or sensory deficit. Coordination and gait normal.  Skin: Skin is warm and dry. No rash noted. No cyanosis. Nails show no clubbing.  Psychiatric: She has a  normal mood and affect. Her speech is normal and behavior is normal. Cognition and memory are normal.          Assessment & Plan:  HM:   Non-smoker mm today , will refer for colonoscopy   She declines vaccines due to financial reasons  HTN: continue meds  Sarcoidosis I advised pt to see the pulmonologist I referred her to  Interntional weight loss   She has lost 21 lbs since June of this year   In setting of FH of newly diagnosed pancreatic CA in her sister:  I did advise pt  That I recommeded a CT of abd /pelvis but she repeatedly declines.  Pt to see m efor weight in 3 months

## 2014-02-12 ENCOUNTER — Encounter: Payer: Self-pay | Admitting: *Deleted

## 2014-02-12 LAB — VITAMIN D 25 HYDROXY (VIT D DEFICIENCY, FRACTURES): VIT D 25 HYDROXY: 35 ng/mL (ref 30–100)

## 2014-03-19 ENCOUNTER — Encounter: Payer: Self-pay | Admitting: Gastroenterology

## 2014-04-24 ENCOUNTER — Other Ambulatory Visit: Payer: Self-pay | Admitting: Internal Medicine

## 2014-04-24 NOTE — Telephone Encounter (Signed)
Refill request

## 2014-05-07 ENCOUNTER — Encounter: Payer: Self-pay | Admitting: Internal Medicine

## 2014-05-07 ENCOUNTER — Ambulatory Visit (INDEPENDENT_AMBULATORY_CARE_PROVIDER_SITE_OTHER): Payer: BC Managed Care – PPO | Admitting: Internal Medicine

## 2014-05-07 VITALS — BP 131/71 | HR 66 | Resp 16 | Ht 64.0 in | Wt 129.0 lb

## 2014-05-07 DIAGNOSIS — F419 Anxiety disorder, unspecified: Secondary | ICD-10-CM | POA: Diagnosis not present

## 2014-05-07 DIAGNOSIS — R634 Abnormal weight loss: Secondary | ICD-10-CM

## 2014-05-07 DIAGNOSIS — I10 Essential (primary) hypertension: Secondary | ICD-10-CM | POA: Diagnosis not present

## 2014-05-07 NOTE — Progress Notes (Signed)
Subjective:    Patient ID: Casey Rowe, female    DOB: Sep 25, 1950, 64 y.o.   MRN: [id number]  HPI  02/11/2014 my note Assessment & Plan:  HM: Non-smoker mm today , will refer for colonoscopy She declines vaccines due to financial reasons  HTN: continue meds  Sarcoidosis I advised pt to see the pulmonologist I referred her to  Interntional weight loss She has lost 21 lbs since June of this year In setting of FH of newly diagnosed pancreatic CA in her sister: I did advise pt That I recommeded a CT of abd /pelvis but she repeatedly declines. Pt to see m efor weight in 3 months        TODAY  Casey Rowe is here for follow up  Of weight loss which is intentional   See chart  .  She has lost over 30 lbs since late 2015 and has lost another 7 lbs since her last visit .  She continues to lose weight even with a normal BMI  She reports she is seeing a new psychiatrist Dr. Lita Mains who is treating her for her anxiety with Buspar and prn Xanax  .  Anxiety is doing better  I do not have psychiatry notes  Allergies  Allergen Reactions  . Demerol Nausea And Vomiting   Past Medical History  Diagnosis Date  . Broken jaw 1980s  . Tubal pregnancy 1981  . Sarcoidosis   . Anxiety   . Depression   . Hypertension   . Asthma   . Allergy    Past Surgical History  Procedure Laterality Date  . Fracture surgery  1980s    broken jaw  . Ectopic pregnancy surgery  1981  . Cesarean section    . Abdominal hysterectomy     History   Social History  . Marital Status: Married    Spouse Name: N/A  . Number of Children: N/A  . Years of Education: N/A   Occupational History  . Not on file.   Social History Main Topics  . Smoking status: Never Smoker   . Smokeless tobacco: Never Used  . Alcohol Use: Yes  . Drug Use: No  . Sexual Activity: Yes   Other Topics Concern  . 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 Hypertension Mother   . Dementia Mother   . Diabetes Mother   . Diabetes Father   . Heart attack Father   . Heart disease Father   . Psoriasis Father   . Cancer Maternal Aunt     breast   Patient Active Problem List   Diagnosis Date Noted  . Weight loss 11/07/2013  . Asthma exacerbation 02/22/2012  . Laryngitis 02/22/2012  . PTSD (post-traumatic stress disorder) 10/19/2011  . Atypical chest pain 02/10/2011  . Anxiety   . Depression   . Hypertension   . Extrinsic asthma   . Sarcoidosis   . Allergy   . Allergic sinusitis 01/18/2011  . Diarrhea 10/06/2010   Current Outpatient Prescriptions on File Prior to Visit  Medication Sig Dispense Refill  . ALPRAZolam (XANAX) 0.25 MG tablet Take 0.25 mg by mouth 3 (three) times daily as needed.    . bisoprolol (ZEBETA) 5 MG tablet TAKE ONE TABLET DAILY 90 tablet 0  . buPROPion [facility]  XL) 300 MG 24 hr tablet Take 1 tablet (300 mg total) by mouth daily. 90 tablet 1  . busPIRone (BUSPAR) 5 MG tablet Take 5 mg by mouth 3 (three) times daily.    Marland Kitchen EST ESTROGENS-METHYLTEST HS 0.625-1.25 MG per tablet     . fluticasone (FLONASE) 50 MCG/ACT nasal spray Place 1 spray into both nostrils daily. 16 g 0  . Fluticasone-Salmeterol (ADVAIR) 100-50 MCG/DOSE AEPB Inhale 1 puff into the lungs 2 (two) times daily. 1 each 3  . losartan (COZAAR) 50 MG tablet Take one bid 180 tablet 1   No current facility-administered medications on file prior to visit.      Review of Systems See HPI    Objective:   Physical Exam Physical Exam  Nursing note and vitals reviewed.  Constitutional: She is oriented to person, place, and time. She appears well-developed and well-nourished.  HENT:  Head: Normocephalic and atraumatic.  Cardiovascular: Normal rate and regular rhythm. Exam reveals no gallop and no friction rub.  No murmur heard.  Pulmonary/Chest: Breath sounds normal. She has no wheezes.  She has no rales.  Neurological: She is alert and oriented to person, place, and time.  Skin: Skin is warm and dry.  Psychiatric: She has a normal mood and affect. Her behavior is normal.       Assessment & Plan:  Intentional  weight loss: I am concerned that with a normal BMI she has lost another 7 lbs.   I again have advised  Ct evaluation to rule out any occult malignancy  ( see my last note).  Pt declines imaging despite my counsel that it is necessary to rule out a cancerous process.    Anxiety:  Managed by psychiatry   HTN continue Zebeta  Pt informed of my departure and closure of practice and that she will be receiving letter with alternative PCP provider options.  She voices understanding

## 2014-05-21 ENCOUNTER — Other Ambulatory Visit: Payer: Self-pay | Admitting: *Deleted

## 2014-05-21 DIAGNOSIS — Z760 Encounter for issue of repeat prescription: Secondary | ICD-10-CM

## 2014-05-21 NOTE — Telephone Encounter (Signed)
Pt called in wanting refills on all medications to last her until she finds new provider. Medications are pending.

## 2014-05-22 MED ORDER — FLUTICASONE-SALMETEROL 100-50 MCG/DOSE IN AEPB: 1.0000 | INHALATION_SPRAY | Freq: Two times a day (BID) | RESPIRATORY_TRACT | Status: DC

## 2014-05-22 MED ORDER — FLUTICASONE PROPIONATE 50 MCG/ACT NA SUSP: 1.0000 | Freq: Every day | NASAL | Status: DC

## 2014-05-22 MED ORDER — LOSARTAN POTASSIUM 50 MG PO TABS: ORAL_TABLET | ORAL | Status: DC

## 2014-05-22 MED ORDER — BISOPROLOL FUMARATE 5 MG PO TABS: 5.0000 mg | ORAL_TABLET | Freq: Every day | ORAL | Status: DC

## 2014-05-22 MED ORDER — BUPROPION HCL ER (XL) 300 MG PO TB24: 300.0000 mg | ORAL_TABLET | Freq: Every day | ORAL | Status: DC

## 2014-05-22 NOTE — Telephone Encounter (Signed)
Casey Rowe  Let pt know that her psychiatric meds  (Xanax and Buspar ) will need refills from her psychiatrist  Her hormone therapy will need to be prescribed from whoever gave her this last.  I do not give testosterone to women    I gave her 6 months worth of all her other medications.  That should give her plenty of time to get a new provider  Ask her if she received the letter in the mail with alternative PCP options

## 2014-05-24 NOTE — Telephone Encounter (Signed)
I left Casey Rowe a message in regards to her med refills -eh

## 2014-11-19 ENCOUNTER — Other Ambulatory Visit: Payer: Self-pay | Admitting: Family Medicine

## 2014-11-19 DIAGNOSIS — Z760 Encounter for issue of repeat prescription: Secondary | ICD-10-CM

## 2014-11-19 MED ORDER — LOSARTAN POTASSIUM 50 MG PO TABS: ORAL_TABLET | ORAL | Status: DC

## 2014-11-19 MED ORDER — BISOPROLOL FUMARATE 5 MG PO TABS: 5.0000 mg | ORAL_TABLET | Freq: Every day | ORAL | Status: DC

## 2014-11-19 NOTE — Telephone Encounter (Signed)
Ok to fill for 30 days until seen (new patient) 12/09/14, new pt. appt in January.

## 2014-11-19 NOTE — Telephone Encounter (Signed)
Caller name: Relationship to patient: Can be reached: Pharmacy: [facility], [facility], [address]  Luiz Iron for call: Pt has 2 days left of bisoprolol. Pt also needing losartan. Appt scheduled for med refills 10/31 3:15pm. New pt in January.

## 2014-12-09 ENCOUNTER — Encounter: Payer: Self-pay | Admitting: Family Medicine

## 2014-12-09 ENCOUNTER — Ambulatory Visit (INDEPENDENT_AMBULATORY_CARE_PROVIDER_SITE_OTHER): Payer: BC Managed Care – PPO | Admitting: Family Medicine

## 2014-12-09 VITALS — BP 120/76 | HR 61 | Temp 98.5°F | Ht 65.0 in | Wt 134.1 lb

## 2014-12-09 DIAGNOSIS — J309 Allergic rhinitis, unspecified: Secondary | ICD-10-CM

## 2014-12-09 DIAGNOSIS — I10 Essential (primary) hypertension: Secondary | ICD-10-CM | POA: Diagnosis not present

## 2014-12-09 DIAGNOSIS — Z760 Encounter for issue of repeat prescription: Secondary | ICD-10-CM | POA: Diagnosis not present

## 2014-12-09 DIAGNOSIS — H65192 Other acute nonsuppurative otitis media, left ear: Secondary | ICD-10-CM

## 2014-12-09 DIAGNOSIS — T7840XD Allergy, unspecified, subsequent encounter: Secondary | ICD-10-CM

## 2014-12-09 DIAGNOSIS — F418 Other specified anxiety disorders: Secondary | ICD-10-CM

## 2014-12-09 DIAGNOSIS — R634 Abnormal weight loss: Secondary | ICD-10-CM

## 2014-12-09 MED ORDER — FLUTICASONE PROPIONATE 50 MCG/ACT NA SUSP: 1.0000 | Freq: Every day | NASAL | Status: DC

## 2014-12-09 MED ORDER — LOSARTAN POTASSIUM 50 MG PO TABS: 50.0000 mg | ORAL_TABLET | Freq: Every day | ORAL | Status: DC

## 2014-12-09 MED ORDER — CEFDINIR 300 MG PO CAPS: 300.0000 mg | ORAL_CAPSULE | Freq: Two times a day (BID) | ORAL | Status: AC

## 2014-12-09 MED ORDER — MONTELUKAST SODIUM 10 MG PO TABS: 10.0000 mg | ORAL_TABLET | Freq: Every day | ORAL | Status: DC

## 2014-12-09 MED ORDER — FLUTICASONE-SALMETEROL 100-50 MCG/DOSE IN AEPB: 1.0000 | INHALATION_SPRAY | Freq: Two times a day (BID) | RESPIRATORY_TRACT | Status: DC

## 2014-12-09 MED ORDER — BISOPROLOL FUMARATE 5 MG PO TABS: 5.0000 mg | ORAL_TABLET | Freq: Every day | ORAL | Status: DC

## 2014-12-09 NOTE — Patient Instructions (Signed)
Mucinex twice daily   Nasal saline as needed  Zinc, coldeeze can Probiotic multistrain, Digestive Advantage or [place]  Otitis Media, Adult Otitis media is redness, soreness, and inflammation of the middle ear. Otitis media may be caused by allergies or, most commonly, by infection. Often it occurs as a complication of the common cold. SIGNS AND SYMPTOMS Symptoms of otitis media may include:  Earache.  Fever.  Ringing in your ear.  Headache.  Leakage of fluid from the ear. DIAGNOSIS To diagnose otitis media, your health care provider will examine your ear with an otoscope. This is an instrument that allows your health care provider to see into your ear in order to examine your eardrum. Your health care provider also will ask you questions about your symptoms. TREATMENT  Typically, otitis media resolves on its own within 3-5 days. Your health care provider may prescribe medicine to ease your symptoms of pain. If otitis media does not resolve within 5 days or is recurrent, your health care provider may prescribe antibiotic medicines if he or she suspects that a bacterial infection is the cause. HOME CARE INSTRUCTIONS   If you were prescribed an antibiotic medicine, finish it all even if you start to feel better.  Take medicines only as directed by your health care provider.  Keep all follow-up visits as directed by your health care provider. SEEK MEDICAL CARE IF:  You have otitis media only in one ear, or bleeding from your nose, or both.  You notice a lump on your neck.  You are not getting better in 3-5 days.  You feel worse instead of better. SEEK IMMEDIATE MEDICAL CARE IF:   You have pain that is not controlled with medicine.  You have swelling, redness, or pain around your ear or stiffness in your neck.  You notice that part of your face is paralyzed.  You notice that the bone behind your ear (mastoid) is tender when you touch it. MAKE SURE  YOU:   Understand these instructions.  Will watch your condition.  Will get help right away if you are not doing well or get worse.   This information is not intended to replace advice given to you by your health care provider. Make sure you discuss any questions you have with your health care provider.   Document Released: 10/31/2003 Document Revised: 02/15/2014 Document Reviewed: 08/22/2012 Elsevier Interactive Patient Education [employer].

## 2014-12-09 NOTE — Progress Notes (Signed)
Pre visit review using our clinic review tool, if applicable. No additional management support is needed unless otherwise documented below in the visit note. 

## 2014-12-22 ENCOUNTER — Encounter: Payer: Self-pay | Admitting: Family Medicine

## 2014-12-22 DIAGNOSIS — H65192 Other acute nonsuppurative otitis media, left ear: Secondary | ICD-10-CM

## 2014-12-22 HISTORY — DX: Other acute nonsuppurative otitis media, left ear: H65.192

## 2014-12-22 NOTE — Assessment & Plan Note (Signed)
Well controlled, no changes to meds. Encouraged heart healthy diet such as the DASH diet and exercise as tolerated.  °

## 2014-12-22 NOTE — Assessment & Plan Note (Signed)
Stabililzed, eating well

## 2014-12-22 NOTE — Assessment & Plan Note (Addendum)
flonase and Zyrtec daily and add Singulair.

## 2014-12-22 NOTE — Assessment & Plan Note (Signed)
Doing well on current meds. Continue the same

## 2014-12-22 NOTE — Progress Notes (Signed)
Subjective:    Patient ID: Casey Rowe, female    DOB: Jan 22, 1951, 64 y.o.   MRN: [id number]  Chief Complaint  Patient presents with  . Establish Care    HPI Patient is in today for new patient appointment. Her greatest complaint today is of some pain and fullness in her left ear which is been worsening this past week. She has a long-standing history of asthma and allergies most notably seasonal but recently more persistent. No fevers or chills. No headache but she does have some facial pressure. Her past medical history is significant for sarcoidosis PTSD, anxiety depression, hypertension as well as previously mentioned allergies and asthma. Denies CP/palp/SOB/HA/congestion/fevers/GI or GU c/o. Taking meds as prescribed  Past Medical History  Diagnosis Date  . Broken jaw ([place]) 1980s  . Tubal pregnancy 1981  . Sarcoidosis ([place])   . Anxiety   . Depression   . Hypertension   . Asthma   . Allergy   . Depression with anxiety     Past Surgical History  Procedure Laterality Date  . Fracture surgery  1980s    broken jaw  . Ectopic pregnancy surgery  1981  . Cesarean section    . Abdominal hysterectomy      Family History  Problem Relation Age of Onset  . Hypertension Mother   . Dementia Mother   . Diabetes Mother   . Diabetes Father   . Heart attack Father   . Heart disease Father   . Psoriasis Father   . Cancer Maternal Aunt     breast    Social History   Social History  . Marital Status: Married    Spouse Name: N/A  . Number of Children: N/A  . Years of Education: N/A   Occupational History  . Not on file.   Social History Main Topics  . Smoking status: Never Smoker   . Smokeless tobacco: Never Used  . Alcohol Use: Yes  . Drug Use: No  . Sexual Activity: Yes   Other Topics Concern  . Not on file   Social History Narrative   Works as a Pharmacist, 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   Married   Grown children- 3          Outpatient Prescriptions Prior to Visit    Medication Sig Dispense Refill  . ALPRAZolam (XANAX) 0.25 MG tablet Take 0.25 mg by mouth 3 (three) times daily as needed.    Marland Kitchen buPROPion (WELLBUTRIN XL) 300 MG 24 hr tablet Take 1 tablet (300 mg total) by mouth daily. 90 tablet 1  . busPIRone (BUSPAR) 5 MG tablet Take 5 mg by mouth 3 (three) times daily.    Marland Kitchen EST ESTROGENS-METHYLTEST HS 0.625-1.25 MG per tablet     . bisoprolol (ZEBETA) 5 MG tablet Take 1 tablet (5 mg total) by mouth daily. 30 tablet 0  . fluticasone (FLONASE) 50 MCG/ACT nasal spray Place 1 spray into both nostrils daily. 16 g 1  . Fluticasone-Salmeterol (ADVAIR) 100-50 MCG/DOSE AEPB Inhale 1 puff into the lungs 2 (two) times daily. 1 each 3  . losartan (COZAAR) 50 MG tablet Take one bid (Patient taking differently: Take 50 mg by mouth daily. ) 60 tablet 0   No facility-administered medications prior to visit.    Allergies  Allergen Reactions  . Demerol Nausea And Vomiting    Review of Systems  Constitutional: Negative for fever, chills and malaise/fatigue.  HENT: Positive for congestion and ear pain. Negative for ear discharge, hearing  loss and nosebleeds.   Eyes: Negative for discharge.  Respiratory: Negative for cough, sputum production and shortness of breath.   Cardiovascular: Negative for chest pain, palpitations and leg swelling.  Gastrointestinal: Negative for heartburn, nausea, vomiting, abdominal pain, diarrhea, constipation and blood in stool.  Genitourinary: Negative for dysuria, urgency, frequency and hematuria.  Musculoskeletal: Negative for myalgias, back pain and falls.  Skin: Negative for rash.  Neurological: Negative for dizziness, sensory change, loss of consciousness, weakness and headaches.  Endo/Heme/Allergies: Negative for environmental allergies. Does not bruise/bleed easily.  Psychiatric/Behavioral: Negative for depression and suicidal ideas. The patient is not nervous/anxious and does not have insomnia.        Objective:    Physical  Exam  Constitutional: She is oriented to person, place, and time. She appears well-developed and well-nourished. No distress.  HENT:  Head: Normocephalic and atraumatic.  Nose: Nose normal.  Left TM erythematous, dull  Eyes: Conjunctivae are normal.  Neck: Neck supple. No thyromegaly present.  Cardiovascular: Normal rate, regular rhythm and normal heart sounds.   No murmur heard. Pulmonary/Chest: Effort normal and breath sounds normal. No respiratory distress.  Abdominal: Soft. Bowel sounds are normal. She exhibits no distension and no mass. There is no tenderness.  Musculoskeletal: She exhibits no edema.  Lymphadenopathy:    She has no cervical adenopathy.  Neurological: She is alert and oriented to person, place, and time.  Skin: Skin is warm and dry.  Psychiatric: She has a normal mood and affect. Her behavior is normal.    BP 120/76 mmHg  Pulse 61  Temp(Src) 98.5 F (36.9 C) (Oral)  Ht 5\' 5"  (1.651 m)  Wt 134 lb 2 oz (60.839 kg)  BMI 22.32 kg/m2  SpO2 97%  LMP 02/08/1997 Wt Readings from Last 3 Encounters:  12/09/14 134 lb 2 oz (60.839 kg)  05/07/14 129 lb (58.514 kg)  02/11/14 136 lb (61.689 kg)     Lab Results  Component Value Date   WBC 4.8 02/11/2014   HGB 14.0 02/11/2014   HCT 43.0 02/11/2014   PLT 211 02/11/2014   GLUCOSE 85 02/11/2014   CHOL 154 02/11/2014   TRIG 100 02/11/2014   HDL 46 02/11/2014   LDLCALC 88 02/11/2014   ALT 15 02/11/2014   AST 18 02/11/2014   NA 139 02/11/2014   K 4.7 02/11/2014   CL 103 02/11/2014   CREATININE 0.72 02/11/2014   BUN 11 02/11/2014   CO2 27 02/11/2014   TSH 1.600 08/29/2013    Lab Results  Component Value Date   TSH 1.600 08/29/2013   Lab Results  Component Value Date   WBC 4.8 02/11/2014   HGB 14.0 02/11/2014   HCT 43.0 02/11/2014   MCV 98.2 02/11/2014   PLT 211 02/11/2014   Lab Results  Component Value Date   NA 139 02/11/2014   K 4.7 02/11/2014   CO2 27 02/11/2014   GLUCOSE 85 02/11/2014   BUN  11 02/11/2014   CREATININE 0.72 02/11/2014   BILITOT 0.6 02/11/2014   ALKPHOS 72 02/11/2014   AST 18 02/11/2014   ALT 15 02/11/2014   PROT 6.3 02/11/2014   ALBUMIN 3.8 02/11/2014   CALCIUM 9.0 02/11/2014   Lab Results  Component Value Date   CHOL 154 02/11/2014   Lab Results  Component Value Date   HDL 46 02/11/2014   Lab Results  Component Value Date   LDLCALC 88 02/11/2014   Lab Results  Component Value Date   TRIG 100 02/11/2014  Lab Results  Component Value Date   CHOLHDL 3.3 02/11/2014   No results found for: HGBA1C     Assessment & Plan:   Problem List Items Addressed This Visit    Weight loss    Stabililzed, eating well      Hypertension    Well controlled, no changes to meds. Encouraged heart healthy diet such as the DASH diet and exercise as tolerated.       Relevant Medications   bisoprolol (ZEBETA) 5 MG tablet   losartan (COZAAR) 50 MG tablet   Depression with anxiety    Doing well on current meds. Continue the same      Allergic sinusitis    flonase and Zyrtec daily and add Singulair.       Relevant Medications   fluticasone (FLONASE) 50 MCG/ACT nasal spray   Acute nonsuppurative otitis media of left ear    Started on Cefdinir and add a probiotics       Other Visit Diagnoses    Medication refill    -  Primary    Relevant Medications    Fluticasone-Salmeterol (ADVAIR) 100-50 MCG/DOSE AEPB    fluticasone (FLONASE) 50 MCG/ACT nasal spray    bisoprolol (ZEBETA) 5 MG tablet    losartan (COZAAR) 50 MG tablet    Allergic state, subsequent encounter        Relevant Medications    montelukast (SINGULAIR) 10 MG tablet       I have changed Ms. Clawson's losartan. I am also having her start on montelukast and cefdinir. Additionally, I am having her maintain her ALPRAZolam, busPIRone, EST ESTROGENS-METHYLTEST HS, buPROPion, Fluticasone-Salmeterol, fluticasone, and bisoprolol.  Meds ordered this encounter  Medications  .  Fluticasone-Salmeterol (ADVAIR) 100-50 MCG/DOSE AEPB    Sig: Inhale 1 puff into the lungs 2 (two) times daily.    Dispense:  1 each    Refill:  6  . fluticasone (FLONASE) 50 MCG/ACT nasal spray    Sig: Place 1 spray into both nostrils daily.    Dispense:  16 g    Refill:  6  . bisoprolol (ZEBETA) 5 MG tablet    Sig: Take 1 tablet (5 mg total) by mouth daily.    Dispense:  30 tablet    Refill:  6  . losartan (COZAAR) 50 MG tablet    Sig: Take 1 tablet (50 mg total) by mouth daily.    Dispense:  30 tablet    Refill:  6  . montelukast (SINGULAIR) 10 MG tablet    Sig: Take 1 tablet (10 mg total) by mouth at bedtime.    Dispense:  30 tablet    Refill:  3  . cefdinir (OMNICEF) 300 MG capsule    Sig: Take 1 capsule (300 mg total) by mouth 2 (two) times daily.    Dispense:  20 capsule    Refill:  0     Penni Homans, MD

## 2014-12-22 NOTE — Assessment & Plan Note (Signed)
Started on Cefdinir and add a probiotics

## 2015-03-07 ENCOUNTER — Other Ambulatory Visit (HOSPITAL_BASED_OUTPATIENT_CLINIC_OR_DEPARTMENT_OTHER): Payer: Self-pay | Admitting: Unknown Physician Specialty

## 2015-03-07 DIAGNOSIS — Z1231 Encounter for screening mammogram for malignant neoplasm of breast: Secondary | ICD-10-CM

## 2015-03-10 ENCOUNTER — Telehealth: Payer: Self-pay | Admitting: *Deleted

## 2015-03-10 NOTE — Telephone Encounter (Signed)
Unable to reach patient at time of pre-visit call. Left message for patient to return call when available.  

## 2015-03-11 ENCOUNTER — Encounter: Payer: Self-pay | Admitting: Family Medicine

## 2015-03-11 ENCOUNTER — Ambulatory Visit (HOSPITAL_BASED_OUTPATIENT_CLINIC_OR_DEPARTMENT_OTHER): Payer: BC Managed Care – PPO

## 2015-03-11 ENCOUNTER — Ambulatory Visit (INDEPENDENT_AMBULATORY_CARE_PROVIDER_SITE_OTHER): Payer: BC Managed Care – PPO | Admitting: Family Medicine

## 2015-03-11 VITALS — BP 108/62 | HR 64 | Temp 97.9°F | Ht 64.0 in | Wt 135.0 lb

## 2015-03-11 DIAGNOSIS — Z0000 Encounter for general adult medical examination without abnormal findings: Secondary | ICD-10-CM | POA: Diagnosis not present

## 2015-03-11 DIAGNOSIS — J4520 Mild intermittent asthma, uncomplicated: Secondary | ICD-10-CM | POA: Diagnosis not present

## 2015-03-11 DIAGNOSIS — Z1159 Encounter for screening for other viral diseases: Secondary | ICD-10-CM

## 2015-03-11 DIAGNOSIS — I10 Essential (primary) hypertension: Secondary | ICD-10-CM | POA: Diagnosis not present

## 2015-03-11 DIAGNOSIS — Z23 Encounter for immunization: Secondary | ICD-10-CM

## 2015-03-11 DIAGNOSIS — T7840XD Allergy, unspecified, subsequent encounter: Secondary | ICD-10-CM

## 2015-03-11 NOTE — Progress Notes (Signed)
Pre visit review using our clinic review tool, if applicable. No additional management support is needed unless otherwise documented below in the visit note. 

## 2015-03-11 NOTE — Progress Notes (Signed)
Subjective:    Patient ID: Casey Rowe, female    DOB: 16-Jan-1951, 65 y.o.   MRN: [id number]  Chief Complaint  Patient presents with  . Annual Exam    HPI Patient is in today for annual exam and follow up. She has been struggling with increased astham flares and congestion. She was noting pressure in her ears and that has improved. Wheezing resolved with Advair. Congestion is better but still present she questions if it is related to mold or mildew in her environment. No fevers, chills. No other new acute concern. No hospitalization. Denies CP/palp/SOB/HA/fevers/GI or GU c/o. Taking meds as prescribed Past Medical History  Diagnosis Date  . Broken jaw ([place]) 1980s  . Tubal pregnancy 1981  . Sarcoidosis ([place])   . Anxiety   . Depression   . Hypertension   . Asthma   . Allergy   . Depression with anxiety   . Preventative health care 03/16/2015    Past Surgical History  Procedure Laterality Date  . Fracture surgery  1980s    broken jaw  . Ectopic pregnancy surgery  1981  . Cesarean section    . Abdominal hysterectomy      Family History  Problem Relation Age of Onset  . Hypertension Mother   . Dementia Mother   . Diabetes Mother   . Diabetes Father   . Heart attack Father   . Heart disease Father   . Psoriasis Father   . Cancer Maternal Aunt     breast  . Cancer Sister     pancreatic  . Hepatitis B Brother   . Rashes / Skin problems Son   . Alcohol abuse Sister     substance  . Gout Brother   . Hypertension Brother   . Other Son     bad allergies, aspirin, dermatitis  . Rashes / Skin problems Son     Social History   Social History  . Marital Status: Married    Spouse Name: N/A  . Number of Children: N/A  . Years of Education: N/A   Occupational History  . Not on file.   Social History Main Topics  . Smoking status: Never Smoker   . Smokeless tobacco: Never Used  . Alcohol Use: Yes  . Drug Use: No  . 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 Not on file   Social History Narrative   Works as a Pharmacist, 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   Married   Grown children- 3          Outpatient Prescriptions Prior to Visit  Medication Sig Dispense Refill  . ALPRAZolam (XANAX) 0.25 MG tablet Take 0.25 mg by mouth daily.     . bisoprolol (ZEBETA) 5 MG tablet Take 1 tablet (5 mg total) by mouth daily. 30 tablet 6  . busPIRone (BUSPAR) 5 MG tablet Take 5 mg by mouth 3 (three) times daily.    Marland Kitchen EST ESTROGENS-METHYLTEST HS 0.625-1.25 MG per tablet     . fluticasone (FLONASE) 50 MCG/ACT nasal spray Place 1 spray into both nostrils daily. 16 g 6  . Fluticasone-Salmeterol (ADVAIR) 100-50 MCG/DOSE AEPB Inhale 1 puff into the lungs 2 (two) times daily. 1 each 6  . losartan (COZAAR) 50 MG tablet Take 1 tablet (50 mg total) by mouth daily. 30 tablet 6  . montelukast (SINGULAIR) 10 MG tablet  Take 1 tablet (10 mg total) by mouth at bedtime. 30 tablet 3  . buPROPion (WELLBUTRIN XL) 300 MG 24 hr tablet Take 1 tablet (300 mg total) by mouth daily. 90 tablet 1   No facility-administered medications prior to visit.    Allergies  Allergen Reactions  . Demerol Nausea And Vomiting    Review of Systems  Constitutional: Negative for fever and malaise/fatigue.  HENT: Positive for congestion.   Eyes: Negative for discharge.  Respiratory: Negative for shortness of breath.   Cardiovascular: Negative for chest pain, palpitations and leg swelling.  Gastrointestinal: Negative for nausea and abdominal pain.  Genitourinary: Negative for dysuria.  Musculoskeletal: Negative for falls.  Skin: Negative for rash.  Neurological: Negative for loss of consciousness and headaches.  Endo/Heme/Allergies: Negative for environmental allergies.  Psychiatric/Behavioral: Negative for depression. The patient is not nervous/anxious.        Objective:    Physical Exam  Constitutional: She  is oriented to person, place, and time. She appears well-developed and well-nourished. No distress.  HENT:  Head: Normocephalic and atraumatic.  Eyes: Conjunctivae are normal.  Neck: Neck supple. No thyromegaly present.  Cardiovascular: Normal rate, regular rhythm and normal heart sounds.   No murmur heard. Pulmonary/Chest: Effort normal and breath sounds normal. No respiratory distress.  Abdominal: Soft. Bowel sounds are normal. She exhibits no distension and no mass. There is no tenderness.  Musculoskeletal: She exhibits no edema.  Lymphadenopathy:    She has no cervical adenopathy.  Neurological: She is alert and oriented to person, place, and time.  Skin: Skin is warm and dry.  Psychiatric: She has a normal mood and affect. Her behavior is normal.    BP 108/62 mmHg  Pulse 64  Temp(Src) 97.9 F (36.6 C) (Oral)  Ht 5\' 4"  (1.626 m)  Wt 135 lb (61.236 kg)  BMI 23.16 kg/m2  SpO2 96%  LMP 02/08/1997 Wt Readings from Last 3 Encounters:  03/11/15 135 lb (61.236 kg)  12/09/14 134 lb 2 oz (60.839 kg)  05/07/14 129 lb (58.514 kg)     Lab Results  Component Value Date   WBC 4.8 02/11/2014   HGB 14.0 02/11/2014   HCT 43.0 02/11/2014   PLT 211 02/11/2014   GLUCOSE 85 02/11/2014   CHOL 154 02/11/2014   TRIG 100 02/11/2014   HDL 46 02/11/2014   LDLCALC 88 02/11/2014   ALT 15 02/11/2014   AST 18 02/11/2014   NA 139 02/11/2014   K 4.7 02/11/2014   CL 103 02/11/2014   CREATININE 0.72 02/11/2014   BUN 11 02/11/2014   CO2 27 02/11/2014   TSH 1.600 08/29/2013    Lab Results  Component Value Date   TSH 1.600 08/29/2013   Lab Results  Component Value Date   WBC 4.8 02/11/2014   HGB 14.0 02/11/2014   HCT 43.0 02/11/2014   MCV 98.2 02/11/2014   PLT 211 02/11/2014   Lab Results  Component Value Date   NA 139 02/11/2014   K 4.7 02/11/2014   CO2 27 02/11/2014   GLUCOSE 85 02/11/2014   BUN 11 02/11/2014   CREATININE 0.72 02/11/2014   BILITOT 0.6 02/11/2014   ALKPHOS  72 02/11/2014   AST 18 02/11/2014   ALT 15 02/11/2014   PROT 6.3 02/11/2014   ALBUMIN 3.8 02/11/2014   CALCIUM 9.0 02/11/2014   Lab Results  Component Value Date   CHOL 154 02/11/2014   Lab Results  Component Value Date   HDL 46 02/11/2014  Lab Results  Component Value Date   LDLCALC 88 02/11/2014   Lab Results  Component Value Date   TRIG 100 02/11/2014   Lab Results  Component Value Date   CHOLHDL 3.3 02/11/2014   No results found for: HGBA1C     Assessment & Plan:   Problem List Items Addressed This Visit    Allergy    Continue Zyrtec, Singulair and Flonase      Extrinsic asthma    Flared recently with acute illness. May continue Advair and Albuteorl      Hypertension    Well controlled, no changes to meds. Encouraged heart healthy diet such as the DASH diet and exercise as tolerated.       Relevant Orders   Lipid panel   TSH   CBC   Comprehensive metabolic panel   Preventative health care    Patient encouraged to maintain heart healthy diet, regular exercise, adequate sleep. Consider daily probiotics. Take medications as prescribed. Labs reviewed. Follows with gynecology and allergist. Immunizations given today       Other Visit Diagnoses    Encounter for immunization    -  Primary    Need for shingles vaccine        Relevant Orders    Varicella-zoster vaccine subcutaneous (Completed)    Need for hepatitis C screening test        Relevant Orders    Hepatitis C antibody       I am having Ms. Alberico maintain her ALPRAZolam, busPIRone, EST ESTROGENS-METHYLTEST HS, Fluticasone-Salmeterol, fluticasone, bisoprolol, losartan, montelukast, and buPROPion.  Meds ordered this encounter  Medications  . buPROPion (WELLBUTRIN XL) 150 MG 24 hr tablet    Sig: Take 150 mg by mouth daily.     Willette Alma, MD

## 2015-03-11 NOTE — Patient Instructions (Signed)
Vitamin D 2000 IU daily will help  Preventive Care for Adults, Female A healthy lifestyle and preventive care can promote health and wellness. Preventive health guidelines for women include the following key practices.  A routine yearly physical is a good way to check with your health care provider about your health and preventive screening. It is a chance to share any concerns and updates on your health and to receive a thorough exam.  Visit your dentist for a routine exam and preventive care every 6 months. Brush your teeth twice a day and floss once a day. Good oral hygiene prevents tooth decay and gum disease.  The frequency of eye exams is based on your age, health, family medical history, use of contact lenses, and other factors. Follow your health care provider's recommendations for frequency of eye exams.  Eat a healthy diet. Foods like vegetables, fruits, whole grains, low-fat dairy products, and lean protein foods contain the nutrients you need without too many calories. Decrease your intake of foods high in solid fats, added sugars, and salt. Eat the right amount of calories for you.Get information about a proper diet from your health care provider, if necessary.  Regular physical exercise is one of the most important things you can do for your health. Most adults should get at least 150 minutes of moderate-intensity exercise (any activity that increases your heart rate and causes you to sweat) each week. In addition, most adults need muscle-strengthening exercises on 2 or more days a week.  Maintain a healthy weight. The body mass index (BMI) is a screening tool to identify possible weight problems. It provides an estimate of body fat based on height and weight. Your health care provider can find your BMI and can help you achieve or maintain a healthy weight.For adults 20 years and older:  A BMI below 18.5 is considered underweight.  A BMI of 18.5 to 24.9 is normal.  A BMI of 25 to  29.9 is considered overweight.  A BMI of 30 and above is considered obese.  Maintain normal blood lipids and cholesterol levels by exercising and minimizing your intake of saturated fat. Eat a balanced diet with plenty of fruit and vegetables. Blood tests for lipids and cholesterol should begin at age 41 and be repeated every 5 years. If your lipid or cholesterol levels are high, you are over 50, or you are at high risk for heart disease, you may need your cholesterol levels checked more frequently.Ongoing high lipid and cholesterol levels should be treated with medicines if diet and exercise are not working.  If you smoke, find out from your health care provider how to quit. If you do not use tobacco, do not start.  Lung cancer screening is recommended for adults aged 44-80 years who are at high risk for developing lung cancer because of a history of smoking. A yearly low-dose CT scan of the lungs is recommended for people who have at least a 30-pack-year history of smoking and are a current smoker or have quit within the past 15 years. A pack year of smoking is smoking an average of 1 pack of cigarettes a day for 1 year (for example: 1 pack a day for 30 years or 2 packs a day for 15 years). Yearly screening should continue until the smoker has stopped smoking for at least 15 years. Yearly screening should be stopped for people who develop a health problem that would prevent them from having lung cancer treatment.  If you are  pregnant, do not drink alcohol. If you are breastfeeding, be very cautious about drinking alcohol. If you are not pregnant and choose to drink alcohol, do not have more than 1 drink per day. One drink is considered to be 12 ounces (355 mL) of beer, 5 ounces (148 mL) of wine, or 1.5 ounces (44 mL) of liquor.  Avoid use of street drugs. Do not share needles with anyone. Ask for help if you need support or instructions about stopping the use of drugs.  High blood pressure causes  heart disease and increases the risk of stroke. Your blood pressure should be checked at least every 1 to 2 years. Ongoing high blood pressure should be treated with medicines if weight loss and exercise do not work.  If you are 32-70 years old, ask your health care provider if you should take aspirin to prevent strokes.  Diabetes screening is done by taking a blood sample to check your blood glucose level after you have not eaten for a certain period of time (fasting). If you are not overweight and you do not have risk factors for diabetes, you should be screened once every 3 years starting at age 60. If you are overweight or obese and you are 32-50 years of age, you should be screened for diabetes every year as part of your cardiovascular risk assessment.  Breast cancer screening is essential preventive care for women. You should practice "breast self-awareness." This means understanding the normal appearance and feel of your breasts and may include breast self-examination. Any changes detected, no matter how small, should be reported to a health care provider. Women in their 66s and 30s should have a clinical breast exam (CBE) by a health care provider as part of a regular health exam every 1 to 3 years. After age 13, women should have a CBE every year. Starting at age 15, women should consider having a mammogram (breast X-ray test) every year. Women who have a family history of breast cancer should talk to their health care provider about genetic screening. Women at a high risk of breast cancer should talk to their health care providers about having an MRI and a mammogram every year.  Breast cancer gene (BRCA)-related cancer risk assessment is recommended for women who have family members with BRCA-related cancers. BRCA-related cancers include breast, ovarian, tubal, and peritoneal cancers. Having family members with these cancers may be associated with an increased risk for harmful changes (mutations)  in the breast cancer genes BRCA1 and BRCA2. Results of the assessment will determine the need for genetic counseling and BRCA1 and BRCA2 testing.  Your health care provider may recommend that you be screened regularly for cancer of the pelvic organs (ovaries, uterus, and vagina). This screening involves a pelvic examination, including checking for microscopic changes to the surface of your cervix (Pap test). You may be encouraged to have this screening done every 3 years, beginning at age 28.  For women ages 69-65, health care providers may recommend pelvic exams and Pap testing every 3 years, or they may recommend the Pap and pelvic exam, combined with testing for human papilloma virus (HPV), every 5 years. Some types of HPV increase your risk of cervical cancer. Testing for HPV may also be done on women of any age with unclear Pap test results.  Other health care providers may not recommend any screening for nonpregnant women who are considered low risk for pelvic cancer and who do not have symptoms. Ask your health care provider  if a screening pelvic exam is right for you.  If you have had past treatment for cervical cancer or a condition that could lead to cancer, you need Pap tests and screening for cancer for at least 20 years after your treatment. If Pap tests have been discontinued, your risk factors (such as having a new sexual partner) need to be reassessed to determine if screening should resume. Some women have medical problems that increase the chance of getting cervical cancer. In these cases, your health care provider may recommend more frequent screening and Pap tests.  Colorectal cancer can be detected and often prevented. Most routine colorectal cancer screening begins at the age of 50 years and continues through age 75 years. However, your health care provider may recommend screening at an earlier age if you have risk factors for colon cancer. On a yearly basis, your health care provider  may provide home test kits to check for hidden blood in the stool. Use of a small camera at the end of a tube, to directly examine the colon (sigmoidoscopy or colonoscopy), can detect the earliest forms of colorectal cancer. Talk to your health care provider about this at age 50, when routine screening begins. Direct exam of the colon should be repeated every 5-10 years through age 75 years, unless early forms of precancerous polyps or small growths are found.  People who are at an increased risk for hepatitis B should be screened for this virus. You are considered at high risk for hepatitis B if:  You were born in a country where hepatitis B occurs often. Talk with your health care provider about which countries are considered high risk.  Your parents were born in a high-risk country and you have not received a shot to protect against hepatitis B (hepatitis B vaccine).  You have HIV or AIDS.  You use needles to inject street drugs.  You live with, or have sex with, someone who has hepatitis B.  You get hemodialysis treatment.  You take certain medicines for conditions like cancer, organ transplantation, and autoimmune conditions.  Hepatitis C blood testing is recommended for all people born from 1945 through 1965 and any individual with known risks for hepatitis C.  Practice safe sex. Use condoms and avoid high-risk sexual practices to reduce the spread of sexually transmitted infections (STIs). STIs include gonorrhea, chlamydia, syphilis, trichomonas, herpes, HPV, and human immunodeficiency virus (HIV). Herpes, HIV, and HPV are viral illnesses that have no cure. They can result in disability, cancer, and death.  You should be screened for sexually transmitted illnesses (STIs) including gonorrhea and chlamydia if:  You are sexually active and are younger than 24 years.  You are older than 24 years and your health care provider tells you that you are at risk for this type of  infection.  Your sexual activity has changed since you were last screened and you are at an increased risk for chlamydia or gonorrhea. Ask your health care provider if you are at risk.  If you are at risk of being infected with HIV, it is recommended that you take a prescription medicine daily to prevent HIV infection. This is called preexposure prophylaxis (PrEP). You are considered at risk if:  You are sexually active and do not regularly use condoms or know the HIV status of your partner(s).  You take drugs by injection.  You are sexually active with a partner who has HIV.  Talk with your health care provider about whether you are at   high risk of being infected with HIV. If you choose to begin PrEP, you should first be tested for HIV. You should then be tested every 3 months for as long as you are taking PrEP.  Osteoporosis is a disease in which the bones lose minerals and strength with aging. This can result in serious bone fractures or breaks. The risk of osteoporosis can be identified using a bone density scan. Women ages 20 years and over and women at risk for fractures or osteoporosis should discuss screening with their health care providers. Ask your health care provider whether you should take a calcium supplement or vitamin D to reduce the rate of osteoporosis.  Menopause can be associated with physical symptoms and risks. Hormone replacement therapy is available to decrease symptoms and risks. You should talk to your health care provider about whether hormone replacement therapy is right for you.  Use sunscreen. Apply sunscreen liberally and repeatedly throughout the day. You should seek shade when your shadow is shorter than you. Protect yourself by wearing long sleeves, pants, a wide-brimmed hat, and sunglasses year round, whenever you are outdoors.  Once a month, do a whole body skin exam, using a mirror to look at the skin on your back. Tell your health care provider of new moles,  moles that have irregular borders, moles that are larger than a pencil eraser, or moles that have changed in shape or color.  Stay current with required vaccines (immunizations).  Influenza vaccine. All adults should be immunized every year.  Tetanus, diphtheria, and acellular pertussis (Td, Tdap) vaccine. Pregnant women should receive 1 dose of Tdap vaccine during each pregnancy. The dose should be obtained regardless of the length of time since the last dose. Immunization is preferred during the 27th-36th week of gestation. An adult who has not previously received Tdap or who does not know her vaccine status should receive 1 dose of Tdap. This initial dose should be followed by tetanus and diphtheria toxoids (Td) booster doses every 10 years. Adults with an unknown or incomplete history of completing a 3-dose immunization series with Td-containing vaccines should begin or complete a primary immunization series including a Tdap dose. Adults should receive a Td booster every 10 years.  Varicella vaccine. An adult without evidence of immunity to varicella should receive 2 doses or a second dose if she has previously received 1 dose. Pregnant females who do not have evidence of immunity should receive the first dose after pregnancy. This first dose should be obtained before leaving the health care facility. The second dose should be obtained 4-8 weeks after the first dose.  Human papillomavirus (HPV) vaccine. Females aged 13-26 years who have not received the vaccine previously should obtain the 3-dose series. The vaccine is not recommended for use in pregnant females. However, pregnancy testing is not needed before receiving a dose. If a female is found to be pregnant after receiving a dose, no treatment is needed. In that case, the remaining doses should be delayed until after the pregnancy. Immunization is recommended for any person with an immunocompromised condition through the age of 62 years if she  did not get any or all doses earlier. During the 3-dose series, the second dose should be obtained 4-8 weeks after the first dose. The third dose should be obtained 24 weeks after the first dose and 16 weeks after the second dose.  Zoster vaccine. One dose is recommended for adults aged 68 years or older unless certain conditions are present.  Measles, mumps, and rubella (MMR) vaccine. Adults born before 1957 generally are considered immune to measles and mumps. Adults born in 1957 or later should have 1 or more doses of MMR vaccine unless there is a contraindication to the vaccine or there is laboratory evidence of immunity to each of the three diseases. A routine second dose of MMR vaccine should be obtained at least 28 days after the first dose for students attending postsecondary schools, health care workers, or international travelers. People who received inactivated measles vaccine or an unknown type of measles vaccine during 1963-1967 should receive 2 doses of MMR vaccine. People who received inactivated mumps vaccine or an unknown type of mumps vaccine before 1979 and are at high risk for mumps infection should consider immunization with 2 doses of MMR vaccine. For females of childbearing age, rubella immunity should be determined. If there is no evidence of immunity, females who are not pregnant should be vaccinated. If there is no evidence of immunity, females who are pregnant should delay immunization until after pregnancy. Unvaccinated health care workers born before 1957 who lack laboratory evidence of measles, mumps, or rubella immunity or laboratory confirmation of disease should consider measles and mumps immunization with 2 doses of MMR vaccine or rubella immunization with 1 dose of MMR vaccine.  Pneumococcal 13-valent conjugate (PCV13) vaccine. When indicated, a person who is uncertain of his immunization history and has no record of immunization should receive the PCV13 vaccine. All adults  65 years of age and older should receive this vaccine. An adult aged 19 years or older who has certain medical conditions and has not been previously immunized should receive 1 dose of PCV13 vaccine. This PCV13 should be followed with a dose of pneumococcal polysaccharide (PPSV23) vaccine. Adults who are at high risk for pneumococcal disease should obtain the PPSV23 vaccine at least 8 weeks after the dose of PCV13 vaccine. Adults older than 63 years of age who have normal immune system function should obtain the PPSV23 vaccine dose at least 1 year after the dose of PCV13 vaccine.  Pneumococcal polysaccharide (PPSV23) vaccine. When PCV13 is also indicated, PCV13 should be obtained first. All adults aged 65 years and older should be immunized. An adult younger than age 65 years who has certain medical conditions should be immunized. Any person who resides in a nursing home or long-term care facility should be immunized. An adult smoker should be immunized. People with an immunocompromised condition and certain other conditions should receive both PCV13 and PPSV23 vaccines. People with human immunodeficiency virus (HIV) infection should be immunized as soon as possible after diagnosis. Immunization during chemotherapy or radiation therapy should be avoided. Routine use of PPSV23 vaccine is not recommended for American Indians, [place] Natives, or people younger than 65 years unless there are medical conditions that require PPSV23 vaccine. When indicated, people who have unknown immunization and have no record of immunization should receive PPSV23 vaccine. One-time revaccination 5 years after the first dose of PPSV23 is recommended for people aged 19-64 years who have chronic kidney failure, nephrotic syndrome, asplenia, or immunocompromised conditions. People who received 1-2 doses of PPSV23 before age 65 years should receive another dose of PPSV23 vaccine at age 65 years or later if at least 5 years have passed since  the previous dose. Doses of PPSV23 are not needed for people immunized with PPSV23 at or after age 65 years.  Meningococcal vaccine. Adults with asplenia or persistent complement component deficiencies should receive 2 doses of quadrivalent meningococcal   conjugate (MenACWY-D) vaccine. The doses should be obtained at least 2 months apart. Microbiologists working with certain meningococcal bacteria, military recruits, people at risk during an outbreak, and people who travel to or live in countries with a high rate of meningitis should be immunized. A first-year college student up through age 21 years who is living in a residence hall should receive a dose if she did not receive a dose on or after her 16th birthday. Adults who have certain high-risk conditions should receive one or more doses of vaccine.  Hepatitis A vaccine. Adults who wish to be protected from this disease, have certain high-risk conditions, work with hepatitis A-infected animals, work in hepatitis A research labs, or travel to or work in countries with a high rate of hepatitis A should be immunized. Adults who were previously unvaccinated and who anticipate close contact with an international adoptee during the first 60 days after arrival in the [place] from a country with a high rate of hepatitis A should be immunized.  Hepatitis B vaccine. Adults who wish to be protected from this disease, have certain high-risk conditions, may be exposed to blood or other infectious body fluids, are household contacts or sex partners of hepatitis B positive people, are clients or workers in certain care facilities, or travel to or work in countries with a high rate of hepatitis B should be immunized.  Haemophilus influenzae type b (Hib) vaccine. A previously unvaccinated person with asplenia or sickle cell disease or having a scheduled splenectomy should receive 1 dose of Hib vaccine. Regardless of previous immunization, a recipient of a  hematopoietic stem cell transplant should receive a 3-dose series 6-12 months after her successful transplant. Hib vaccine is not recommended for adults with HIV infection. Preventive Services / Frequency Ages 19 to 39 years  Blood pressure check.** / Every 3-5 years.  Lipid and cholesterol check.** / Every 5 years beginning at age 20.  Clinical breast exam.** / Every 3 years for women in their 20s and 30s.  BRCA-related cancer risk assessment.** / For women who have family members with a BRCA-related cancer (breast, ovarian, tubal, or peritoneal cancers).  Pap test.** / Every 2 years from ages 21 through 29. Every 3 years starting at age 30 through age 65 or 70 with a history of 3 consecutive normal Pap tests.  HPV screening.** / Every 3 years from ages 30 through ages 65 to 70 with a history of 3 consecutive normal Pap tests.  Hepatitis C blood test.** / For any individual with known risks for hepatitis C.  Skin self-exam. / Monthly.  Influenza vaccine. / Every year.  Tetanus, diphtheria, and acellular pertussis (Tdap, Td) vaccine.** / Consult your health care provider. Pregnant women should receive 1 dose of Tdap vaccine during each pregnancy. 1 dose of Td every 10 years.  Varicella vaccine.** / Consult your health care provider. Pregnant females who do not have evidence of immunity should receive the first dose after pregnancy.  HPV vaccine. / 3 doses over 6 months, if 26 and younger. The vaccine is not recommended for use in pregnant females. However, pregnancy testing is not needed before receiving a dose.  Measles, mumps, rubella (MMR) vaccine.** / You need at least 1 dose of MMR if you were born in 1957 or later. You may also need a 2nd dose. For females of childbearing age, rubella immunity should be determined. If there is no evidence of immunity, females who are not pregnant should be vaccinated. If there   is no evidence of immunity, females who are pregnant should delay  immunization until after pregnancy.  Pneumococcal 13-valent conjugate (PCV13) vaccine.** / Consult your health care provider.  Pneumococcal polysaccharide (PPSV23) vaccine.** / 1 to 2 doses if you smoke cigarettes or if you have certain conditions.  Meningococcal vaccine.** / 1 dose if you are age 19 to 21 years and a first-year college student living in a residence hall, or have one of several medical conditions, you need to get vaccinated against meningococcal disease. You may also need additional booster doses.  Hepatitis A vaccine.** / Consult your health care provider.  Hepatitis B vaccine.** / Consult your health care provider.  Haemophilus influenzae type b (Hib) vaccine.** / Consult your health care provider. Ages 40 to 64 years  Blood pressure check.** / Every year.  Lipid and cholesterol check.** / Every 5 years beginning at age 20 years.  Lung cancer screening. / Every year if you are aged 55-80 years and have a 30-pack-year history of smoking and currently smoke or have quit within the past 15 years. Yearly screening is stopped once you have quit smoking for at least 15 years or develop a health problem that would prevent you from having lung cancer treatment.  Clinical breast exam.** / Every year after age 40 years.  BRCA-related cancer risk assessment.** / For women who have family members with a BRCA-related cancer (breast, ovarian, tubal, or peritoneal cancers).  Mammogram.** / Every year beginning at age 40 years and continuing for as long as you are in good health. Consult with your health care provider.  Pap test.** / Every 3 years starting at age 30 years through age 65 or 70 years with a history of 3 consecutive normal Pap tests.  HPV screening.** / Every 3 years from ages 30 years through ages 65 to 70 years with a history of 3 consecutive normal Pap tests.  Fecal occult blood test (FOBT) of stool. / Every year beginning at age 50 years and continuing until age  75 years. You may not need to do this test if you get a colonoscopy every 10 years.  Flexible sigmoidoscopy or colonoscopy.** / Every 5 years for a flexible sigmoidoscopy or every 10 years for a colonoscopy beginning at age 50 years and continuing until age 75 years.  Hepatitis C blood test.** / For all people born from 1945 through 1965 and any individual with known risks for hepatitis C.  Skin self-exam. / Monthly.  Influenza vaccine. / Every year.  Tetanus, diphtheria, and acellular pertussis (Tdap/Td) vaccine.** / Consult your health care provider. Pregnant women should receive 1 dose of Tdap vaccine during each pregnancy. 1 dose of Td every 10 years.  Varicella vaccine.** / Consult your health care provider. Pregnant females who do not have evidence of immunity should receive the first dose after pregnancy.  Zoster vaccine.** / 1 dose for adults aged 60 years or older.  Measles, mumps, rubella (MMR) vaccine.** / You need at least 1 dose of MMR if you were born in 1957 or later. You may also need a second dose. For females of childbearing age, rubella immunity should be determined. If there is no evidence of immunity, females who are not pregnant should be vaccinated. If there is no evidence of immunity, females who are pregnant should delay immunization until after pregnancy.  Pneumococcal 13-valent conjugate (PCV13) vaccine.** / Consult your health care provider.  Pneumococcal polysaccharide (PPSV23) vaccine.** / 1 to 2 doses if you smoke cigarettes or if   you have certain conditions.  Meningococcal vaccine.** / Consult your health care provider.  Hepatitis A vaccine.** / Consult your health care provider.  Hepatitis B vaccine.** / Consult your health care provider.  Haemophilus influenzae type b (Hib) vaccine.** / Consult your health care provider. Ages 32 years and over  Blood pressure check.** / Every year.  Lipid and cholesterol check.** / Every 5 years beginning at age 31  years.  Lung cancer screening. / Every year if you are aged 39-80 years and have a 30-pack-year history of smoking and currently smoke or have quit within the past 15 years. Yearly screening is stopped once you have quit smoking for at least 15 years or develop a health problem that would prevent you from having lung cancer treatment.  Clinical breast exam.** / Every year after age 79 years.  BRCA-related cancer risk assessment.** / For women who have family members with a BRCA-related cancer (breast, ovarian, tubal, or peritoneal cancers).  Mammogram.** / Every year beginning at age 35 years and continuing for as long as you are in good health. Consult with your health care provider.  Pap test.** / Every 3 years starting at age 11 years through age 35 or 48 years with 3 consecutive normal Pap tests. Testing can be stopped between 65 and 70 years with 3 consecutive normal Pap tests and no abnormal Pap or HPV tests in the past 10 years.  HPV screening.** / Every 3 years from ages 50 years through ages 4 or 70 years with a history of 3 consecutive normal Pap tests. Testing can be stopped between 65 and 70 years with 3 consecutive normal Pap tests and no abnormal Pap or HPV tests in the past 10 years.  Fecal occult blood test (FOBT) of stool. / Every year beginning at age 73 years and continuing until age 30 years. You may not need to do this test if you get a colonoscopy every 10 years.  Flexible sigmoidoscopy or colonoscopy.** / Every 5 years for a flexible sigmoidoscopy or every 10 years for a colonoscopy beginning at age 3 years and continuing until age 68 years.  Hepatitis C blood test.** / For all people born from [birth year] through 1965 and any individual with known risks for hepatitis C.  Osteoporosis screening.** / A one-time screening for women ages 29 years and over and women at risk for fractures or osteoporosis.  Skin self-exam. / Monthly.  Influenza vaccine. / Every year.  Tetanus,  diphtheria, and acellular pertussis (Tdap/Td) vaccine.** / 1 dose of Td every 10 years.  Varicella vaccine.** / Consult your health care provider.  Zoster vaccine.** / 1 dose for adults aged 8 years or older.  Pneumococcal 13-valent conjugate (PCV13) vaccine.** / Consult your health care provider.  Pneumococcal polysaccharide (PPSV23) vaccine.** / 1 dose for all adults aged 55 years and older.  Meningococcal vaccine.** / Consult your health care provider.  Hepatitis A vaccine.** / Consult your health care provider.  Hepatitis B vaccine.** / Consult your health care provider.  Haemophilus influenzae type b (Hib) vaccine.** / Consult your health care provider. ** Family history and personal history of risk and conditions may change your health care provider's recommendations.   This information is not intended to replace advice given to you by your health care provider. Make sure you discuss any questions you have with your health care provider.   Document Released: 03/23/2001 Document Revised: 02/15/2014 Document Reviewed: 06/22/2010 Elsevier Interactive Patient Education [employer].

## 2015-03-16 ENCOUNTER — Encounter: Payer: Self-pay | Admitting: Family Medicine

## 2015-03-16 DIAGNOSIS — Z0000 Encounter for general adult medical examination without abnormal findings: Secondary | ICD-10-CM | POA: Insufficient documentation

## 2015-03-16 HISTORY — DX: Encounter for general adult medical examination without abnormal findings: Z00.00

## 2015-03-16 NOTE — Assessment & Plan Note (Signed)
Well controlled, no changes to meds. Encouraged heart healthy diet such as the DASH diet and exercise as tolerated.  °

## 2015-03-16 NOTE — Assessment & Plan Note (Addendum)
Patient encouraged to maintain heart healthy diet, regular exercise, adequate sleep. Consider daily probiotics. Take medications as prescribed. Labs reviewed. Follows with gynecology and allergist. Immunizations given today

## 2015-03-16 NOTE — Assessment & Plan Note (Signed)
Continue Zyrtec, Singulair and Flonase.

## 2015-03-16 NOTE — Assessment & Plan Note (Signed)
Flared recently with acute illness. May continue Advair and Albuteorl

## 2015-03-26 ENCOUNTER — Other Ambulatory Visit: Payer: BC Managed Care – PPO

## 2015-03-26 ENCOUNTER — Other Ambulatory Visit (INDEPENDENT_AMBULATORY_CARE_PROVIDER_SITE_OTHER): Payer: BC Managed Care – PPO

## 2015-03-26 DIAGNOSIS — I10 Essential (primary) hypertension: Secondary | ICD-10-CM | POA: Diagnosis not present

## 2015-03-26 DIAGNOSIS — Z1159 Encounter for screening for other viral diseases: Secondary | ICD-10-CM | POA: Diagnosis not present

## 2015-03-26 LAB — LIPID PANEL
CHOL/HDL RATIO: 4
Cholesterol: 159 mg/dL (ref 0–200)
HDL: 38.3 mg/dL — AB (ref >39.00)
LDL Cholesterol: 102 mg/dL — ABNORMAL HIGH (ref 0–99)
NONHDL: 120.21
Triglycerides: 93 mg/dL (ref 0.0–149.0)
VLDL: 18.6 mg/dL (ref 0.0–40.0)

## 2015-03-26 LAB — CBC
HEMATOCRIT: 39.8 % (ref 36.0–46.0)
HEMOGLOBIN: 13.3 g/dL (ref 12.0–15.0)
MCHC: 33.5 g/dL (ref 30.0–36.0)
MCV: 97.4 fl (ref 78.0–100.0)
Platelets: 210 10*3/uL (ref 150.0–400.0)
RBC: 4.08 Mil/uL (ref 3.87–5.11)
RDW: 13.1 % (ref 11.5–15.5)
WBC: 5.6 10*3/uL (ref 4.0–10.5)

## 2015-03-26 LAB — COMPREHENSIVE METABOLIC PANEL
ALBUMIN: 4.1 g/dL (ref 3.5–5.2)
ALT: 17 U/L (ref 0–35)
AST: 18 U/L (ref 0–37)
Alkaline Phosphatase: 40 U/L (ref 39–117)
BUN: 11 mg/dL (ref 6–23)
CHLORIDE: 105 meq/L (ref 96–112)
CO2: 30 meq/L (ref 19–32)
CREATININE: 0.67 mg/dL (ref 0.40–1.20)
Calcium: 9.4 mg/dL (ref 8.4–10.5)
GFR: 94.62 mL/min (ref >60.00)
Glucose, Bld: 90 mg/dL (ref 70–99)
POTASSIUM: 4 meq/L (ref 3.5–5.1)
SODIUM: 138 meq/L (ref 135–145)
Total Bilirubin: 0.6 mg/dL (ref 0.2–1.2)
Total Protein: 6.6 g/dL (ref 6.0–8.3)

## 2015-03-26 LAB — TSH: TSH: 1.64 u[IU]/mL (ref 0.35–4.50)

## 2015-03-27 LAB — HEPATITIS C ANTIBODY: HCV Ab: REACTIVE — AB

## 2015-03-28 LAB — HEPATITIS C RNA QUANTITATIVE: HCV Quantitative: NOT DETECTED IU/mL (ref <15)

## 2015-03-31 LAB — COLOGUARD: COLOGUARD: NEGATIVE

## 2015-04-11 ENCOUNTER — Telehealth: Payer: Self-pay | Admitting: Family Medicine

## 2015-04-11 ENCOUNTER — Encounter: Payer: Self-pay | Admitting: Family Medicine

## 2015-04-11 NOTE — Telephone Encounter (Signed)
Received Cologuard results. Documented in health maintenance and abstracted result--which were negative.  Done on 03/31/2015. Called the patient left a detailed message of results/mailed a copy to her home.

## 2015-07-17 ENCOUNTER — Other Ambulatory Visit: Payer: Self-pay | Admitting: Family Medicine

## 2015-08-27 ENCOUNTER — Other Ambulatory Visit: Payer: Self-pay | Admitting: Family Medicine

## 2015-09-02 ENCOUNTER — Ambulatory Visit (HOSPITAL_BASED_OUTPATIENT_CLINIC_OR_DEPARTMENT_OTHER)
Admission: RE | Admit: 2015-09-02 | Discharge: 2015-09-02 | Disposition: A | Discharge status: Home / Self Care | Payer: BC Managed Care – PPO | Source: Ambulatory Visit | Attending: Unknown Physician Specialty | Admitting: Unknown Physician Specialty

## 2015-09-02 DIAGNOSIS — Z1231 Encounter for screening mammogram for malignant neoplasm of breast: Secondary | ICD-10-CM

## 2016-01-08 ENCOUNTER — Other Ambulatory Visit: Payer: Self-pay | Admitting: Family Medicine

## 2016-01-08 DIAGNOSIS — Z760 Encounter for issue of repeat prescription: Secondary | ICD-10-CM

## 2016-01-12 ENCOUNTER — Other Ambulatory Visit: Payer: Self-pay | Admitting: Family Medicine

## 2016-01-29 ENCOUNTER — Ambulatory Visit (INDEPENDENT_AMBULATORY_CARE_PROVIDER_SITE_OTHER): Payer: BC Managed Care – PPO | Admitting: Medical

## 2016-01-29 ENCOUNTER — Encounter: Payer: Self-pay | Admitting: Medical

## 2016-01-29 VITALS — BP 104/78 | HR 61 | Temp 98.1°F | Resp 16 | Ht 64.0 in | Wt 134.6 lb

## 2016-01-29 DIAGNOSIS — R0981 Nasal congestion: Secondary | ICD-10-CM | POA: Diagnosis not present

## 2016-01-29 DIAGNOSIS — R062 Wheezing: Secondary | ICD-10-CM

## 2016-01-29 DIAGNOSIS — J209 Acute bronchitis, unspecified: Secondary | ICD-10-CM

## 2016-01-29 DIAGNOSIS — R05 Cough: Secondary | ICD-10-CM | POA: Diagnosis not present

## 2016-01-29 DIAGNOSIS — R059 Cough, unspecified: Secondary | ICD-10-CM

## 2016-01-29 MED ORDER — METHYLPREDNISOLONE ACETATE 40 MG/ML IJ SUSP
40.0000 mg | Freq: Once | INTRAMUSCULAR | Status: AC
  Administered 2016-01-29: 40 mg via INTRAMUSCULAR

## 2016-01-29 MED ORDER — BENZONATATE 100 MG PO CAPS
100.0000 mg | ORAL_CAPSULE | Freq: Three times a day (TID) | ORAL | 0 refills | Status: DC | PRN
Start: 2016-01-29 — End: 2016-03-16

## 2016-01-29 MED ORDER — DOXYCYCLINE HYCLATE 100 MG PO TABS: 100.0000 mg | ORAL_TABLET | Freq: Two times a day (BID) | ORAL | 0 refills | Status: DC

## 2016-01-29 MED ORDER — PREDNISONE 10 MG PO TABS: 10.0000 mg | ORAL_TABLET | Freq: Every day | ORAL | 0 refills | Status: DC

## 2016-01-29 NOTE — Patient Instructions (Addendum)
You appear to have bronchitis. Rest hydrate and tylenol for fever. I am prescribing cough medicine benzonatate, and doxycycline antibiotic. For your nasal congestion you could use your flonase.  For wheezing use your advair. Depomedrol 40 mg im given today. Also tapered dose of prednisone.  If not improving by Tuesday upcoming week then notify us and get cbc anc cxr.  If you worsen over long holiday weekend then ED evaluation  Follow up in 7-10 days or as needed  I did explain to pt that I thought was good idea to get cbc anc cxr today in light of 3 day weekend upcoming. She declined getting studies today.

## 2016-01-29 NOTE — Progress Notes (Signed)
Subjective:    Patient ID: Casey Rowe, female    DOB: 09-16-1950, 65 y.o.   MRN: [id number]  HPI  Pt in reporting she has long history of some uri/allergy symptoms  on and off for years(she attributes to mold exposure at work)  Last week a lot of sinus pressure and some chest congestion.  Pt is on flonase. Pt has frontal and maxillary sinus pressure. Recent severe nasal congestion. Mild productive cough last 3 days.  Pt has been wheezing and has been on inhalers for years.   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   Pt states a lot of coworkers have suffered similar symptoms.    Review of Systems  Constitutional: Positive for diaphoresis. Negative for chills and fatigue.  HENT: Positive for congestion and sinus pressure. Negative for ear pain, nosebleeds, sinus pain, sneezing, sore throat, tinnitus and voice change.   Respiratory: Positive for cough and wheezing. Negative for choking.   Gastrointestinal: Negative for abdominal pain.  Musculoskeletal: Negative for myalgias and neck pain.  Skin: Negative for rash.  Neurological: Negative for dizziness, light-headedness and headaches.  Hematological: Negative for adenopathy. Does not bruise/bleed easily.  Psychiatric/Behavioral: Negative for behavioral problems and confusion.   Past Medical History:  Diagnosis Date  . Allergy   . Anxiety   . Asthma   . Broken jaw ([place]) 1980s  . Depression   . Depression with anxiety   . Hypertension   . Preventative health care 03/16/2015  . Sarcoidosis ([place])   . Tubal pregnancy 1981     Social History   Social History  . Marital status: Married    Spouse name: N/A  . Number of children: N/A  . Years of education: N/A   Occupational History  . Not on file.   Social History Main Topics  . Smoking status: Never Smoker  . Smokeless tobacco: Never Used  . Alcohol use Yes  . Drug  use: No  . 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 ABDOMINAL HYSTERECTOMY    . CESAREAN SECTION    . ECTOPIC PREGNANCY SURGERY  1981  . FRACTURE SURGERY  1980s   broken jaw    Family History  Problem Relation Age of Onset  . Hypertension Mother   . Dementia Mother   . Diabetes Mother   . Diabetes Father   . Heart attack Father   . Heart disease Father   . Psoriasis Father   . Cancer Maternal Aunt     breast  . Cancer Sister     pancreatic  . Hepatitis B Brother   . Rashes / Skin problems Son   . Alcohol abuse Sister     substance  . Gout Brother   . Hypertension Brother   . Other Son     bad allergies, aspirin, dermatitis  . Rashes / Skin problems Son     Allergies  Allergen Reactions  . Demerol Nausea And Vomiting    Current Outpatient Prescriptions on File Prior to Visit  Medication  Sig Dispense Refill  . ADVAIR DISKUS 100-50 MCG/DOSE AEPB INHALE 1 PUFF INTO THE LUNGS 2 (TWO) TIMES DAILY. 60 each 2  . ALPRAZolam (XANAX) 0.25 MG tablet Take 0.25 mg by mouth daily.     . bisoprolol (ZEBETA) 5 MG tablet TAKE 1 TABLET (5 MG TOTAL) BY MOUTH DAILY. 30 tablet 4  . busPIRone (BUSPAR) 5 MG tablet Take 5 mg by mouth 2 (two) times daily.     Marland Kitchen EST ESTROGENS-METHYLTEST HS 0.625-1.25 MG per tablet     . fluticasone (FLONASE) 50 MCG/ACT nasal spray Place 1 spray into both nostrils daily. (Patient taking differently: Place 1 spray into both nostrils 2 (two) times daily. ) 16 g 6  . losartan (COZAAR) 50 MG tablet TAKE 1 TABLET (50 MG TOTAL) BY MOUTH DAILY. 30 tablet 5   No current facility-administered medications on file prior to visit.     BP 104/78 (BP Location: Left Arm, Cuff Size: Normal)   Pulse  61   Temp 98.1 F (36.7 C) (Oral)   Resp 16   Ht 5\' 4"  (1.626 m)   Wt 134 lb 9.6 oz (61.1 kg)   LMP 02/08/1997   SpO2 98%   BMI 23.10 kg/m       Objective:   Physical Exam  General  Mental Status - Alert. General Appearance - Well groomed. Not in acute distress.  Skin Rashes- No Rashes.  HEENT Head- Normal. Ear Auditory Canal - Left- Normal. Right - Normal.Tympanic Membrane- Left- Normal. Right- Normal. Eye Sclera/Conjunctiva- Left- Normal. Right- Normal. Nose & Sinuses Nasal Mucosa- Left-  Boggy and Congested. Right-  Boggy and  Congested.Bilateral no maxillary and  No frontal sinus pressure. Mouth & Throat Lips: Upper Lip- Normal: no dryness, cracking, pallor, cyanosis, or vesicular eruption. Lower Lip-Normal: no dryness, cracking, pallor, cyanosis or vesicular eruption. Buccal Mucosa- Bilateral- No Aphthous ulcers. Oropharynx- No Discharge or Erythema. +pnd. Tonsils: Characteristics- Bilateral- No Erythema or Congestion. Size/Enlargement- Bilateral- No enlargement. Discharge- bilateral-None.  Neck Neck- Supple. No Masses.   Chest and Lung Exam Auscultation: Breath Sounds:- even and unlabored.(mild tight and occasional wheeze heard), No accesory muscles used.  Cardiovascular Auscultation:Rythm- Regular, rate and rhythm. Murmurs & Other Heart Sounds:Ausculatation of the heart reveal- No Murmurs.  Lymphatic Head & Neck General Head & Neck Lymphatics: Bilateral: Description- No Localized lymphadenopathy.       Assessment & Plan:  You appear to have bronchitis. Rest hydrate and tylenol for fever. I am prescribing cough medicine benzonatate, and doxycycline antibiotic. For your nasal congestion you could use your flonase.  For wheezing use your advair. Depomedrol 40 mg im given today. Also tapered dose of prednisone.  If not improving by Tuesday upcoming week then notify us and get cbc anc cxr.  If you worsen over long holiday weekend then ED  evaluation  Follow up in 7-10 days or as needed  Milee Qualls, Percell Miller, [place]

## 2016-01-29 NOTE — Progress Notes (Signed)
Pre visit review using our clinic review tool, if applicable. No additional management support is needed unless otherwise documented below in the visit note. 

## 2016-02-05 ENCOUNTER — Encounter: Payer: Self-pay | Admitting: Family Medicine

## 2016-02-05 ENCOUNTER — Telehealth: Payer: Self-pay | Admitting: Family Medicine

## 2016-02-05 ENCOUNTER — Other Ambulatory Visit: Payer: Self-pay | Admitting: Family Medicine

## 2016-02-05 DIAGNOSIS — F418 Other specified anxiety disorders: Secondary | ICD-10-CM

## 2016-02-05 NOTE — Telephone Encounter (Signed)
done

## 2016-02-05 NOTE — Telephone Encounter (Signed)
Caller name: Relationship to patient: Self Can be reached: [phone]    Reason for call: Request a referral to [facility] in [place]. States that he current Psych doctor is Publishing copy.

## 2016-02-22 ENCOUNTER — Other Ambulatory Visit: Payer: Self-pay | Admitting: Family Medicine

## 2016-03-16 ENCOUNTER — Encounter: Payer: Self-pay | Admitting: Family Medicine

## 2016-03-16 ENCOUNTER — Ambulatory Visit (INDEPENDENT_AMBULATORY_CARE_PROVIDER_SITE_OTHER): Payer: BC Managed Care – PPO | Admitting: Family Medicine

## 2016-03-16 VITALS — BP 128/74 | HR 63 | Temp 97.5°F | Wt 134.6 lb

## 2016-03-16 DIAGNOSIS — Z0000 Encounter for general adult medical examination without abnormal findings: Secondary | ICD-10-CM

## 2016-03-16 DIAGNOSIS — I10 Essential (primary) hypertension: Secondary | ICD-10-CM

## 2016-03-16 DIAGNOSIS — J309 Allergic rhinitis, unspecified: Secondary | ICD-10-CM

## 2016-03-16 DIAGNOSIS — J45909 Unspecified asthma, uncomplicated: Secondary | ICD-10-CM

## 2016-03-16 DIAGNOSIS — J329 Chronic sinusitis, unspecified: Secondary | ICD-10-CM | POA: Diagnosis not present

## 2016-03-16 DIAGNOSIS — Z23 Encounter for immunization: Secondary | ICD-10-CM | POA: Diagnosis not present

## 2016-03-16 DIAGNOSIS — J45901 Unspecified asthma with (acute) exacerbation: Secondary | ICD-10-CM

## 2016-03-16 DIAGNOSIS — E1065 Type 1 diabetes mellitus with hyperglycemia: Secondary | ICD-10-CM

## 2016-03-16 MED ORDER — BUDESONIDE 32 MCG/ACT NA SUSP: 2.0000 | Freq: Every day | NASAL | 5 refills | Status: DC

## 2016-03-16 MED ORDER — BENZONATATE 100 MG PO CAPS: 100.0000 mg | ORAL_CAPSULE | Freq: Three times a day (TID) | ORAL | 0 refills | Status: DC | PRN

## 2016-03-16 MED ORDER — ALBUTEROL SULFATE HFA 108 (90 BASE) MCG/ACT IN AERS: 2.0000 | INHALATION_SPRAY | Freq: Four times a day (QID) | RESPIRATORY_TRACT | 0 refills | Status: DC | PRN

## 2016-03-16 MED ORDER — CEFDINIR 300 MG PO CAPS: 300.0000 mg | ORAL_CAPSULE | Freq: Two times a day (BID) | ORAL | 0 refills | Status: AC

## 2016-03-16 MED ORDER — AZELASTINE HCL 0.1 % NA SOLN: 2.0000 | Freq: Two times a day (BID) | NASAL | 12 refills | Status: DC

## 2016-03-16 MED ORDER — BUDESONIDE-FORMOTEROL FUMARATE 160-4.5 MCG/ACT IN AERO: 2.0000 | INHALATION_SPRAY | Freq: Two times a day (BID) | RESPIRATORY_TRACT | 12 refills | Status: DC

## 2016-03-16 NOTE — Progress Notes (Signed)
Pre visit review using our clinic review tool, if applicable. No additional management support is needed unless otherwise documented below in the visit note. 

## 2016-03-16 NOTE — Assessment & Plan Note (Signed)
Well controlled, no changes to meds. Encouraged heart healthy diet such as the DASH diet and exercise as tolerated.  °

## 2016-03-16 NOTE — Progress Notes (Signed)
Patient ID: Casey Rowe, female   DOB: 25-Apr-1950, 66 y.o.   MRN: [id number]   Subjective:    Patient ID: Casey Rowe, female    DOB: 08-28-1950, 66 y.o.   MRN: [id number]  Chief Complaint  Patient presents with  . Annual Exam  . Hypertension   I acted as a Education administrator for Dr. Charlett Blake. Princess, RMA  Hypertension  This is a recurrent problem. The current episode started more than 1 year ago. The problem is unchanged. The problem is controlled. Pertinent negatives include no blurred vision, chest pain, headaches, malaise/fatigue, palpitations or shortness of breath.    Patient is in today for an annual exam following up on hypertension, asthma, allergies and more. She is struggling with increased head congestion with noted headache, and some increase in wheezing. Some myalgias noted. Denies CP/palp/SOB/HA/congestion/fevers/GI or GU c/o. Taking meds as prescribed Past Medical History:  Diagnosis Date  . Allergy   . Anxiety   . Asthma   . Broken jaw ([place]) 1980s  . Depression   . Depression with anxiety   . Hypertension   . Preventative health care 03/16/2015  . Sarcoidosis ([place])   . Tubal pregnancy 1981    Past Surgical History:  Procedure Laterality Date  . ABDOMINAL HYSTERECTOMY    . CESAREAN SECTION    . ECTOPIC PREGNANCY SURGERY  1981  . FRACTURE SURGERY  1980s   broken jaw    Family History  Problem Relation Age of Onset  . Hypertension Mother   . Dementia Mother   . Diabetes Mother   . Diabetes Father   . Heart attack Father   . Heart disease Father   . Psoriasis Father   . Cancer Sister     pancreatic  . Hepatitis B Brother   . Rashes / Skin problems Son   . Alcohol abuse Sister     substance  . Gout Brother   . Hypertension Brother   . Other Son     bad allergies, aspirin, dermatitis  . Rashes / Skin problems Son   . Cancer Maternal Aunt     breast    Social History   Social History  . Marital status: Married    Spouse name: N/A  . Number of  children: N/A  . Years of education: N/A   Occupational History  . Not on file.   Social History Main Topics  . Smoking status: Never Smoker  . Smokeless tobacco: Never Used  . Alcohol use Yes  . Drug use: No  . 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 ADVAIR DISKUS 100-50 MCG/DOSE AEPB INHALE 1 PUFF INTO THE LUNGS 2 (TWO) TIMES DAILY. 60 each 2  . ALPRAZolam (XANAX) 0.25 MG tablet Take 0.25 mg by mouth daily.     . bisoprolol (ZEBETA) 5 MG tablet TAKE 1 TABLET (5 MG TOTAL) BY MOUTH DAILY. 30 tablet 4  . buPROPion (WELLBUTRIN XL) 300 MG 24 hr tablet Take 300 mg by mouth daily.    . busPIRone (BUSPAR) 5 MG tablet Take 5 mg by mouth 2 (two) times daily.     Marland Kitchen  EST ESTROGENS-METHYLTEST HS 0.625-1.25 MG per tablet     . fluticasone (FLONASE) 50 MCG/ACT nasal spray Place 1 spray into both nostrils daily. (Patient taking differently: Place 1 spray into both nostrils 2 (two) times daily. ) 16 g 6  . losartan (COZAAR) 50 MG tablet TAKE 1 TABLET (50 MG TOTAL) BY MOUTH DAILY. 30 tablet 4  . predniSONE (DELTASONE) 10 MG tablet Take 1 tablet (10 mg total) by mouth daily with breakfast. 6 TAB PO DAY 1 5 TAB PO DAY 2 4 TAB PO DAY 3 3 TAB PO DAY 4 2 TAB PO DAY 5 1 TAB PO DAY 6 21 tablet 0  . benzonatate (TESSALON) 100 MG capsule Take 1 capsule (100 mg total) by mouth 3 (three) times daily as needed for cough. 21 capsule 0  . doxycycline (VIBRA-TABS) 100 MG tablet Take 1 tablet (100 mg total) by mouth 2 (two) times daily. 20 tablet 0   No facility-administered medications prior to visit.     Allergies  Allergen Reactions  . Demerol Nausea And Vomiting  . Singulair [Montelukast]     Dry  mouth/dehydration    Review of Systems  Constitutional: Negative for fever and malaise/fatigue.  HENT: Positive for congestion and sinus pain.   Eyes: Negative for blurred vision.  Respiratory: Positive for wheezing. Negative for cough and shortness of breath.   Cardiovascular: Negative for chest pain, palpitations and leg swelling.  Gastrointestinal: Negative for vomiting.  Musculoskeletal: Negative for back pain.  Skin: Negative for rash.  Neurological: Negative for loss of consciousness and headaches.       Objective:    Physical Exam  Constitutional: She is oriented to person, place, and time. She appears well-developed and well-nourished. No distress.  HENT:  Head: Normocephalic and atraumatic.  Eyes: Conjunctivae are normal.  Neck: Normal range of motion. No thyromegaly present.  Cardiovascular: Normal rate and regular rhythm.   Pulmonary/Chest: Effort normal and breath sounds normal. She has no wheezes.  Abdominal: Soft. Bowel sounds are normal. She exhibits no mass. There is no tenderness. There is no rebound and no guarding.  Musculoskeletal: Normal range of motion. She exhibits no edema or deformity.  Neurological: She is alert and oriented to person, place, and time.  Skin: Skin is warm and dry. She is not diaphoretic.  Psychiatric: She has a normal mood and affect.    BP 128/74 (BP Location: Left Arm, Patient Position: Sitting, Cuff Size: Normal)   Pulse 63   Temp 97.5 F (36.4 C) (Oral)   Wt 134 lb 9.6 oz (61.1 kg)   LMP 02/08/1997   SpO2 99%   BMI 23.10 kg/m  Wt Readings from Last 3 Encounters:  03/16/16 134 lb 9.6 oz (61.1 kg)  01/29/16 134 lb 9.6 oz (61.1 kg)  03/11/15 135 lb (61.2 kg)     Lab Results  Component Value Date   WBC 5.6 03/26/2015   HGB 13.3 03/26/2015   HCT 39.8 03/26/2015   PLT 210.0 03/26/2015   GLUCOSE 90 03/26/2015   CHOL 159 03/26/2015   TRIG 93.0 03/26/2015   HDL 38.30 (L) 03/26/2015   LDLCALC 102 (H) 03/26/2015   ALT 17  03/26/2015   AST 18 03/26/2015   NA 138 03/26/2015   K 4.0 03/26/2015   CL 105 03/26/2015   CREATININE 0.67 03/26/2015   BUN 11 03/26/2015   CO2 30 03/26/2015   TSH 1.64 03/26/2015    Lab Results  Component Value Date   TSH 1.64 03/26/2015  Lab Results  Component Value Date   WBC 5.6 03/26/2015   HGB 13.3 03/26/2015   HCT 39.8 03/26/2015   MCV 97.4 03/26/2015   PLT 210.0 03/26/2015   Lab Results  Component Value Date   NA 138 03/26/2015   K 4.0 03/26/2015   CO2 30 03/26/2015   GLUCOSE 90 03/26/2015   BUN 11 03/26/2015   CREATININE 0.67 03/26/2015   BILITOT 0.6 03/26/2015   ALKPHOS 40 03/26/2015   AST 18 03/26/2015   ALT 17 03/26/2015   PROT 6.6 03/26/2015   ALBUMIN 4.1 03/26/2015   CALCIUM 9.4 03/26/2015   GFR 94.62 03/26/2015   Lab Results  Component Value Date   CHOL 159 03/26/2015   Lab Results  Component Value Date   HDL 38.30 (L) 03/26/2015   Lab Results  Component Value Date   LDLCALC 102 (H) 03/26/2015   Lab Results  Component Value Date   TRIG 93.0 03/26/2015   Lab Results  Component Value Date   CHOLHDL 4 03/26/2015   No results found for: HGBA1C     Assessment & Plan:   Problem List Items Addressed This Visit    Sinusitis - Primary    Flared worse than usual over past few weeks. Works in a [employer]. Started on Cefdinir and Mucinex.      Relevant Medications   benzonatate (TESSALON) 100 MG capsule   cefdinir (OMNICEF) 300 MG capsule   budesonide (RHINOCORT AQUA) 32 MCG/ACT nasal spray   azelastine (ASTELIN) 0.1 % nasal spray   Hypertension    Well controlled, no changes to meds. Encouraged heart healthy diet such as the DASH diet and exercise as tolerated.       Relevant Orders   Comprehensive metabolic panel   Extrinsic asthma   Relevant Medications   budesonide-formoterol (SYMBICORT) 160-4.5 MCG/ACT inhaler   albuterol (PROVENTIL HFA;VENTOLIN HFA) 108 (90 Base) MCG/ACT inhaler   Asthma exacerbation    Encouraged  to use Albuterol generously for next couple of days as needed.       Relevant Medications   budesonide-formoterol (SYMBICORT) 160-4.5 MCG/ACT inhaler   albuterol (PROVENTIL HFA;VENTOLIN HFA) 108 (90 Base) MCG/ACT inhaler   Preventative health care    Patient encouraged to maintain heart healthy diet, regular exercise, adequate sleep. Consider daily probiotics. Take medications as prescribed      Relevant Medications   albuterol (PROVENTIL HFA;VENTOLIN HFA) 108 (90 Base) MCG/ACT inhaler    Other Visit Diagnoses    Type 1 diabetes mellitus with hyperglycemia (HCC)       Needs flu shot       Relevant Orders   Flu Vaccine QUAD 36+ mos PF IM (Fluarix & Fluzone Quad PF) (Completed)      I have discontinued Ms. Rosato's doxycycline. I have also changed her mometasone-formoterol to budesonide-formoterol. Additionally, I am having her start on cefdinir, budesonide, and azelastine. Lastly, I am having her maintain her ALPRAZolam, busPIRone, EST ESTROGENS-METHYLTEST HS, fluticasone, ADVAIR DISKUS, bisoprolol, buPROPion, predniSONE, losartan, benzonatate, and albuterol.  Meds ordered this encounter  Medications  . benzonatate (TESSALON) 100 MG capsule    Sig: Take 1 capsule (100 mg total) by mouth 3 (three) times daily as needed for cough.    Dispense:  21 capsule    Refill:  0  . cefdinir (OMNICEF) 300 MG capsule    Sig: Take 1 capsule (300 mg total) by mouth 2 (two) times daily.    Dispense:  20 capsule    Refill:  0  .  budesonide-formoterol (SYMBICORT) 160-4.5 MCG/ACT inhaler    Sig: Inhale 2 puffs into the lungs 2 (two) times daily.    Dispense:  1 Inhaler    Refill:  12  . albuterol (PROVENTIL HFA;VENTOLIN HFA) 108 (90 Base) MCG/ACT inhaler    Sig: Inhale 2 puffs into the lungs every 6 (six) hours as needed for wheezing.    Dispense:  1 Inhaler    Refill:  0  . budesonide (RHINOCORT AQUA) 32 MCG/ACT nasal spray    Sig: Place 2 sprays into both nostrils daily.    Dispense:  8.6 g      Refill:  5  . azelastine (ASTELIN) 0.1 % nasal spray    Sig: Place 2 sprays into both nostrils 2 (two) times daily. Use in each nostril as directed    Dispense:  30 mL    Refill:  12    CMA served as scribe during this visit. History, Physical and Plan performed by medical provider. Documentation and orders reviewed and attested to.  Penni Homans, MD

## 2016-03-16 NOTE — Assessment & Plan Note (Signed)
Patient encouraged to maintain heart healthy diet, regular exercise, adequate sleep. Consider daily probiotics. Take medications as prescribed 

## 2016-03-16 NOTE — Assessment & Plan Note (Addendum)
Flared worse than usual over past few weeks. Works in a [employer]. Started on Cefdinir and Mucinex.

## 2016-03-16 NOTE — Patient Instructions (Addendum)
NOW probiotic 10 strain cap daily with plenty of fluids, 64 oz of clear fluids.   Preventive Care 40-64 Years, Female Preventive care refers to lifestyle choices and visits with your health care provider that can promote health and wellness. What does preventive care include?  A yearly physical exam. This is also called an annual well check.  Dental exams once or twice a year.  Routine eye exams. Ask your health care provider how often you should have your eyes checked.  Personal lifestyle choices, including:  Daily care of your teeth and gums.  Regular physical activity.  Eating a healthy diet.  Avoiding tobacco and drug use.  Limiting alcohol use.  Practicing safe sex.  Taking low-dose aspirin daily starting at age 85.  Taking vitamin and mineral supplements as recommended by your health care provider. What happens during an annual well check? The services and screenings done by your health care provider during your annual well check will depend on your age, overall health, lifestyle risk factors, and family history of disease. Counseling  Your health care provider may ask you questions about your:  Alcohol use.  Tobacco use.  Drug use.  Emotional well-being.  Home and relationship well-being.  Sexual activity.  Eating habits.  Work and work Statistician.  Method of birth control.  Menstrual cycle.  Pregnancy history. Screening  You may have the following tests or measurements:  Height, weight, and BMI.  Blood pressure.  Lipid and cholesterol levels. These may be checked every 5 years, or more frequently if you are over 44 years old.  Skin check.  Lung cancer screening. You may have this screening every year starting at age 50 if you have a 30-pack-year history of smoking and currently smoke or have quit within the past 15 years.  Fecal occult blood test (FOBT) of the stool. You may have this test every year starting at age 32.  Flexible  sigmoidoscopy or colonoscopy. You may have a sigmoidoscopy every 5 years or a colonoscopy every 10 years starting at age 86.  Hepatitis C blood test.  Hepatitis B blood test.  Sexually transmitted disease (STD) testing.  Diabetes screening. This is done by checking your blood sugar (glucose) after you have not eaten for a while (fasting). You may have this done every 1-3 years.  Mammogram. This may be done every 1-2 years. Talk to your health care provider about when you should start having regular mammograms. This may depend on whether you have a family history of breast cancer.  BRCA-related cancer screening. This may be done if you have a family history of breast, ovarian, tubal, or peritoneal cancers.  Pelvic exam and Pap test. This may be done every 3 years starting at age 44. Starting at age 44, this may be done every 5 years if you have a Pap test in combination with an HPV test.  Bone density scan. This is done to screen for osteoporosis. You may have this scan if you are at high risk for osteoporosis. Discuss your test results, treatment options, and if necessary, the need for more tests with your health care provider. Vaccines  Your health care provider may recommend certain vaccines, such as:  Influenza vaccine. This is recommended every year.  Tetanus, diphtheria, and acellular pertussis (Tdap, Td) vaccine. You may need a Td booster every 10 years.  Varicella vaccine. You may need this if you have not been vaccinated.  Zoster vaccine. You may need this after age 102.  Measles, mumps,  and rubella (MMR) vaccine. You may need at least one dose of MMR if you were born in 1957 or later. You may also need a second dose.  Pneumococcal 13-valent conjugate (PCV13) vaccine. You may need this if you have certain conditions and were not previously vaccinated.  Pneumococcal polysaccharide (PPSV23) vaccine. You may need one or two doses if you smoke cigarettes or if you have certain  conditions.  Meningococcal vaccine. You may need this if you have certain conditions.  Hepatitis A vaccine. You may need this if you have certain conditions or if you travel or work in places where you may be exposed to hepatitis A.  Hepatitis B vaccine. You may need this if you have certain conditions or if you travel or work in places where you may be exposed to hepatitis B.  Haemophilus influenzae type b (Hib) vaccine. You may need this if you have certain conditions. Talk to your health care provider about which screenings and vaccines you need and how often you need them. This information is not intended to replace advice given to you by your health care provider. Make sure you discuss any questions you have with your health care provider. Document Released: 02/21/2015 Document Revised: 10/15/2015 Document Reviewed: 11/26/2014 Elsevier Interactive Patient Education  2017 [employer].

## 2016-03-21 NOTE — Assessment & Plan Note (Signed)
Encouraged to use Albuterol generously for next couple of days as needed.

## 2016-04-16 ENCOUNTER — Other Ambulatory Visit: Payer: Self-pay | Admitting: Family Medicine

## 2016-04-16 MED ORDER — BISOPROLOL FUMARATE 5 MG PO TABS: 5.0000 mg | ORAL_TABLET | Freq: Every day | ORAL | 1 refills | Status: DC

## 2016-04-16 MED ORDER — LOSARTAN POTASSIUM 50 MG PO TABS: 50.0000 mg | ORAL_TABLET | Freq: Every day | ORAL | 1 refills | Status: DC

## 2016-05-01 ENCOUNTER — Ambulatory Visit (HOSPITAL_COMMUNITY)
Admission: EM | Admit: 2016-05-01 | Discharge: 2016-05-01 | Disposition: A | Discharge status: Home / Self Care | Payer: BC Managed Care – PPO | Attending: Internal Medicine | Admitting: Internal Medicine

## 2016-05-01 ENCOUNTER — Encounter (HOSPITAL_COMMUNITY): Payer: Self-pay | Admitting: *Deleted

## 2016-05-01 DIAGNOSIS — R0789 Other chest pain: Secondary | ICD-10-CM | POA: Diagnosis not present

## 2016-05-01 DIAGNOSIS — M940 Chondrocostal junction syndrome [Tietze]: Secondary | ICD-10-CM

## 2016-05-01 DIAGNOSIS — M7918 Myalgia, other site: Secondary | ICD-10-CM

## 2016-05-01 DIAGNOSIS — M791 Myalgia: Secondary | ICD-10-CM

## 2016-05-01 MED ORDER — DICLOFENAC SODIUM 1 % TD GEL: 1.0000 "application " | Freq: Four times a day (QID) | TRANSDERMAL | 0 refills | Status: DC

## 2016-05-01 NOTE — ED Triage Notes (Signed)
Pt  States  Has       Upper back pain   X   5  Days  Pt  Reports       Hurts     When    She takes  A  Deep  Breath  And  On movement     And  Certain  posistions   Pt  Is  Awake  And  Alert  And  Oriented      Seems  Somewhat  Anxious

## 2016-05-01 NOTE — Discharge Instructions (Signed)
Recommend applying the diclofenac gel over the painful musculature up to 4 times a day. You may also add Salon Pas with Menthol patches over the most tender and painful areas. Then apply heat. You may use heating pad at home often known and thermic care wraps that can stay on the area of pain for up to 8 hours in the liver heat. Try to change the ergonomics at her workstation so that your shoulder is lowered and not having to contract the muscles that are painful.

## 2016-05-01 NOTE — ED Provider Notes (Signed)
CSN: [id number]     Arrival date & time 05/01/16  1201 History   First MD Initiated Contact with Patient 05/01/16 1223     Chief Complaint  Patient presents with  . Back Pain   (Consider location/radiation/quality/duration/timing/severity/associated sxs/prior Treatment) 66 year old pleasant female somewhat loquacious and frequently diverting attention to other chronic conditions presents with acute right shoulder and right upper back pain as well as right upper anterior chest pain. All of these began approximately week ago and has been getting worse during the last few days. There is pain to the right paracervical musculature, gradual onset and is manifested more when rotating head to the right. Denies any known injury. There is pain over the trapezius muscle more so at the ridge as well as a supraspinatus musculature. Denies any known injury or trauma. She works at a computer most of the day. She has a history of chronic lung disease that she could not remember the name, records indicate history of sarcoidosis. She is on Symbicort and has a prescription for albuterol but not using it.      Past Medical History:  Diagnosis Date  . Allergy   . Anxiety   . Asthma   . Broken jaw ([place]) 1980s  . Depression   . Depression with anxiety   . Hypertension   . Preventative health care 03/16/2015  . Sarcoidosis ([place])   . Tubal pregnancy 1981   Past Surgical History:  Procedure Laterality Date  . ABDOMINAL HYSTERECTOMY    . CESAREAN SECTION    . ECTOPIC PREGNANCY SURGERY  1981  . FRACTURE SURGERY  1980s   broken jaw   Family History  Problem Relation Age of Onset  . Hypertension Mother   . Dementia Mother   . Diabetes Mother   . Diabetes Father   . Heart attack Father   . Heart disease Father   . Psoriasis Father   . Cancer Sister     pancreatic  . Hepatitis B Brother   . Rashes / Skin problems Son   . Alcohol abuse Sister     substance  . Gout Brother   . Hypertension Brother    . Other Son     bad allergies, aspirin, dermatitis  . Rashes / Skin problems Son   . Cancer Maternal Aunt     breast   Social History  Substance Use Topics  . Smoking status: Never Smoker  . Smokeless tobacco: Never Used  . Alcohol use Yes   OB History    Gravida Para Term Preterm AB Living   6 3     3      SAB TAB Ectopic Multiple Live Births   2 1           Review of Systems  Constitutional: Positive for activity change. Negative for chills and fever.  HENT: Positive for congestion, postnasal drip and rhinorrhea.   Respiratory: Positive for shortness of breath.   Cardiovascular: Positive for chest pain.  Gastrointestinal: Negative.   Musculoskeletal: Positive for back pain and myalgias.       As per HPI  Skin: Negative for color change, pallor and rash.  Neurological: Negative.     Allergies  Demerol and Singulair [montelukast]  Home Medications   Prior to Admission medications   Medication Sig Start Date End Date Taking? Authorizing Provider  ADVAIR DISKUS 100-50 MCG/DOSE AEPB INHALE 1 PUFF INTO THE LUNGS 2 (TWO) TIMES DAILY. 01/08/16   Mosie Lukes, MD  albuterol (PROVENTIL HFA;VENTOLIN  HFA) 108 (90 Base) MCG/ACT inhaler Inhale 2 puffs into the lungs every 6 (six) hours as needed for wheezing. 03/16/16 03/16/17  Mosie Lukes, MD  ALPRAZolam Duanne Moron) 0.25 MG tablet Take 0.25 mg by mouth daily.     Historical Provider, MD  azelastine (ASTELIN) 0.1 % nasal spray Place 2 sprays into both nostrils 2 (two) times daily. Use in each nostril as directed 03/16/16   Mosie Lukes, MD  benzonatate (TESSALON) 100 MG capsule Take 1 capsule (100 mg total) by mouth 3 (three) times daily as needed for cough. 03/16/16   Mosie Lukes, MD  bisoprolol (ZEBETA) 5 MG tablet Take 1 tablet (5 mg total) by mouth daily. 04/16/16   Mosie Lukes, MD  budesonide (RHINOCORT AQUA) 32 MCG/ACT nasal spray Place 2 sprays into both nostrils daily. 03/16/16   Mosie Lukes, MD  budesonide-formoterol  [facility]) 160-4.5 MCG/ACT inhaler Inhale 2 puffs into the lungs 2 (two) times daily. 03/16/16   Mosie Lukes, MD  buPROPion (WELLBUTRIN XL) 300 MG 24 hr tablet Take 300 mg by mouth daily.    Historical Provider, MD  busPIRone (BUSPAR) 5 MG tablet Take 5 mg by mouth 2 (two) times daily.     Historical Provider, MD  diclofenac sodium (VOLTAREN) 1 % GEL Apply 1 application topically 4 (four) times daily. 05/01/16   Janne Napoleon, NP  EST ESTROGENS-METHYLTEST HS 0.625-1.25 MG per tablet  02/06/14   Historical Provider, MD  fluticasone (FLONASE) 50 MCG/ACT nasal spray Place 1 spray into both nostrils daily. Patient taking differently: Place 1 spray into both nostrils 2 (two) times daily.  12/09/14   Mosie Lukes, MD  losartan (COZAAR) 50 MG tablet Take 1 tablet (50 mg total) by mouth daily. 04/16/16   Mosie Lukes, MD  predniSONE (DELTASONE) 10 MG tablet Take 1 tablet (10 mg total) by mouth daily with breakfast. 6 TAB PO DAY 1 5 TAB PO DAY 2 4 TAB PO DAY 3 3 TAB PO DAY 4 2 TAB PO DAY 5 1 TAB PO DAY 6 01/29/16   Mackie Pai, PA-C   Meds Ordered and Administered this Visit  Medications - No data to display  BP 135/73 (BP Location: Left Arm)   Pulse (!) 50   Temp 97.5 F (36.4 C) (Oral)   Resp 17   LMP 02/08/1997   SpO2 99%  No data found.   Physical Exam  Constitutional: She is oriented to person, place, and time. She appears well-developed and well-nourished. No distress.  HENT:  Head: Normocephalic and atraumatic.  Oropharynx with minor streaky erythema, no swelling or exudate.  Eyes: EOM are normal. Pupils are equal, round, and reactive to light.  Neck: Neck supple.  Limited rotation to the right due to) cervical muscle pain.  Cardiovascular: Normal rate and regular rhythm.   Pulmonary/Chest: Effort normal and breath sounds normal. No respiratory distress. She has no wheezes. She has no rales. She exhibits tenderness.  Marked right upper anterior chest wall tenderness.  Reproducible 3 checks.  Musculoskeletal: She exhibits tenderness. She exhibits no edema or deformity.  Marked tenderness to the right trapezius ridge, posterior trapezius, supraspinatus musculature. Also tenderness to the right paracervical musculature. No spinal tenderness. No discoloration or swelling.  Lymphadenopathy:    She has no cervical adenopathy.  Neurological: She is alert and oriented to person, place, and time. No cranial nerve deficit. Coordination normal.  Skin: Skin is warm and dry.  Nursing note and vitals reviewed.  Urgent Care Course     Procedures (including critical care time)  Labs Review Labs Reviewed - No data to display  Imaging Review No results found.   Visual Acuity Review  Right Eye Distance:   Left Eye Distance:   Bilateral Distance:    Right Eye Near:   Left Eye Near:    Bilateral Near:         MDM   1. Muscle pain, myofacial   2. Costochondritis, acute   3. Chest wall pain    Recommend applying the diclofenac gel over the painful musculature up to 4 times a day. You may also add Salon Pas with Menthol patches over the most tender and painful areas. Then apply heat. You may use heating pad at home often known and thermic care wraps that can stay on the area of pain for up to 8 hours in the liver heat. Try to change the ergonomics at her workstation so that your shoulder is lowered and not having to contract the muscles that are painful. Meds ordered this encounter  Medications  . diclofenac sodium (VOLTAREN) 1 % GEL    Sig: Apply 1 application topically 4 (four) times daily.    Dispense:  100 g    Refill:  0    Order Specific Question:   Supervising Provider    Answer:   Sherlene Shams [[id number]]       Janne Napoleon, NP 05/01/16 1254

## 2016-05-12 ENCOUNTER — Other Ambulatory Visit (INDEPENDENT_AMBULATORY_CARE_PROVIDER_SITE_OTHER): Payer: BC Managed Care – PPO

## 2016-05-12 DIAGNOSIS — I10 Essential (primary) hypertension: Secondary | ICD-10-CM

## 2016-05-13 LAB — COMPREHENSIVE METABOLIC PANEL
ALT: 15 U/L (ref 0–35)
AST: 19 U/L (ref 0–37)
Albumin: 4 g/dL (ref 3.5–5.2)
Alkaline Phosphatase: 40 U/L (ref 39–117)
BUN: 16 mg/dL (ref 6–23)
CALCIUM: 9.4 mg/dL (ref 8.4–10.5)
CHLORIDE: 105 meq/L (ref 96–112)
CO2: 31 meq/L (ref 19–32)
Creatinine, Ser: 0.76 mg/dL (ref 0.40–1.20)
GFR: 81.51 mL/min (ref >60.00)
Glucose, Bld: 74 mg/dL (ref 70–99)
POTASSIUM: 4.9 meq/L (ref 3.5–5.1)
Sodium: 139 mEq/L (ref 135–145)
Total Bilirubin: 0.6 mg/dL (ref 0.2–1.2)
Total Protein: 6.7 g/dL (ref 6.0–8.3)

## 2016-05-24 ENCOUNTER — Telehealth: Payer: BC Managed Care – PPO | Admitting: Family

## 2016-05-24 DIAGNOSIS — B9689 Other specified bacterial agents as the cause of diseases classified elsewhere: Secondary | ICD-10-CM

## 2016-05-24 DIAGNOSIS — J329 Chronic sinusitis, unspecified: Secondary | ICD-10-CM

## 2016-05-24 MED ORDER — AMOXICILLIN-POT CLAVULANATE 875-125 MG PO TABS: 1.0000 | ORAL_TABLET | Freq: Two times a day (BID) | ORAL | 0 refills | Status: AC

## 2016-05-24 MED ORDER — PREDNISONE 5 MG PO TABS: 5.0000 mg | ORAL_TABLET | ORAL | 0 refills | Status: DC

## 2016-05-24 NOTE — Progress Notes (Signed)
We are sorry that you are not feeling well.  Here is how we plan to help!  Based on what you have shared with me it looks like you have sinusitis.  Sinusitis is inflammation and infection in the sinus cavities of the head.  Based on your presentation I believe you most likely have Acute Bacterial Sinusitis.  This is an infection caused by bacteria and is treated with antibiotics. I have prescribed Augmentin 875mg /125mg  one tablet twice daily with food, for 7 days. You may use an oral decongestant such as Mucinex D or if you have glaucoma or high blood pressure use plain Mucinex. Saline nasal spray help and can safely be used as often as needed for congestion.  If you develop worsening sinus pain, fever or notice severe headache and vision changes, or if symptoms are not better after completion of antibiotic, please schedule an appointment with a health care provider.  I have also prescribed a Prednisone dose pak taper.   *If you continue to worsen, please be seen face to face, especially if you are in respiratory distress.   Sinus infections are not as easily transmitted as other respiratory infection, however we still recommend that you avoid close contact with loved ones, especially the very young and elderly.  Remember to wash your hands thoroughly throughout the day as this is the number one way to prevent the spread of infection!  Home Care:  Only take medications as instructed by your medical team.  Complete the entire course of an antibiotic.  Do not take these medications with alcohol.  A steam or ultrasonic humidifier can help congestion.  You can place a towel over your head and breathe in the steam from hot water coming from a faucet.  Avoid close contacts especially the very young and the elderly.  Cover your mouth when you cough or sneeze.  Always remember to wash your hands.  Get Help Right Away If:  You develop worsening fever or sinus pain.  You develop a severe head  ache or visual changes.  Your symptoms persist after you have completed your treatment plan.  Make sure you  Understand these instructions.  Will watch your condition.  Will get help right away if you are not doing well or get worse.  Your e-visit answers were reviewed by a board certified advanced clinical practitioner to complete your personal care plan.  Depending on the condition, your plan could have included both over the counter or prescription medications.  If there is a problem please reply  once you have received a response from your provider.  Your safety is important to [place].  If you have drug allergies check your prescription carefully.    You can use MyChart to ask questions about today's visit, request a non-urgent call back, or ask for a work or school excuse for 24 hours related to this e-Visit. If it has been greater than 24 hours you will need to follow up with your provider, or enter a new e-Visit to address those concerns.  You will get an e-mail in the next two days asking about your experience.  I hope that your e-visit has been valuable and will speed your recovery. Thank you for using e-visits.

## 2016-05-26 ENCOUNTER — Telehealth: Payer: Self-pay | Admitting: Family Medicine

## 2016-05-26 MED ORDER — CEFDINIR 300 MG PO CAPS: 300.0000 mg | ORAL_CAPSULE | Freq: Two times a day (BID) | ORAL | 0 refills | Status: DC

## 2016-05-26 NOTE — Telephone Encounter (Signed)
Relation to FG:HWEX Call back number:[phone] Pharmacy: [facility] [place], [place] - [address] [phone] ([phone] (Fax)     Reason for call:  Patient states amoxicillin-clavulanate (AUGMENTIN) 875-125 MG tablet causes her diarrhea and would like alternate, please advise

## 2016-05-26 NOTE — Telephone Encounter (Signed)
rx cefdnir sent to pharmacy since she refuses augmentin. Hopefully she will be able to tolerate.

## 2016-05-27 NOTE — Telephone Encounter (Signed)
Patient informed. 

## 2016-08-03 ENCOUNTER — Telehealth: Payer: BC Managed Care – PPO | Admitting: Family

## 2016-08-03 DIAGNOSIS — J019 Acute sinusitis, unspecified: Secondary | ICD-10-CM

## 2016-08-03 DIAGNOSIS — B9689 Other specified bacterial agents as the cause of diseases classified elsewhere: Secondary | ICD-10-CM

## 2016-08-03 MED ORDER — METHYLPREDNISOLONE 4 MG PO TBPK: ORAL_TABLET | ORAL | Status: DC

## 2016-08-03 MED ORDER — DOXYCYCLINE HYCLATE 100 MG PO TABS: 100.0000 mg | ORAL_TABLET | Freq: Two times a day (BID) | ORAL | 0 refills | Status: DC

## 2016-08-03 NOTE — Progress Notes (Signed)
We are sorry that you are not feeling well.  Here is how we plan to help!  Based on what you have shared with me it looks like you have sinusitis.  Sinusitis is inflammation and infection in the sinus cavities of the head.  Based on your presentation I believe you most likely have Acute Bacterial Sinusitis.  This is an infection caused by bacteria and is treated with antibiotics. I have prescribed Doxycycline 100mg  by mouth twice a day for 10 days. I have also sent in a Medrol dosepak.  You may use an oral decongestant such as Mucinex D or if you have glaucoma or high blood pressure use plain Mucinex. Saline nasal spray help and can safely be used as often as needed for congestion.  If you develop worsening sinus pain, fever or notice severe headache and vision changes, or if symptoms are not better after completion of antibiotic, please schedule an appointment with a health care provider.    Sinus infections are not as easily transmitted as other respiratory infection, however we still recommend that you avoid close contact with loved ones, especially the very young and elderly.  Remember to wash your hands thoroughly throughout the day as this is the number one way to prevent the spread of infection!  Home Care:  Only take medications as instructed by your medical team.  Complete the entire course of an antibiotic.  Do not take these medications with alcohol.  A steam or ultrasonic humidifier can help congestion.  You can place a towel over your head and breathe in the steam from hot water coming from a faucet.  Avoid close contacts especially the very young and the elderly.  Cover your mouth when you cough or sneeze.  Always remember to wash your hands.  Get Help Right Away If:  You develop worsening fever or sinus pain.  You develop a severe head ache or visual changes.  Your symptoms persist after you have completed your treatment plan.  Make sure you  Understand these  instructions.  Will watch your condition.  Will get help right away if you are not doing well or get worse.  Your e-visit answers were reviewed by a board certified advanced clinical practitioner to complete your personal care plan.  Depending on the condition, your plan could have included both over the counter or prescription medications.  If there is a problem please reply  once you have received a response from your provider.  Your safety is important to [place].  If you have drug allergies check your prescription carefully.    You can use MyChart to ask questions about today's visit, request a non-urgent call back, or ask for a work or school excuse for 24 hours related to this e-Visit. If it has been greater than 24 hours you will need to follow up with your provider, or enter a new e-Visit to address those concerns.  You will get an e-mail in the next two days asking about your experience.  I hope that your e-visit has been valuable and will speed your recovery. Thank you for using e-visits.

## 2016-09-16 ENCOUNTER — Encounter: Payer: Self-pay | Admitting: Family Medicine

## 2016-09-16 ENCOUNTER — Ambulatory Visit (INDEPENDENT_AMBULATORY_CARE_PROVIDER_SITE_OTHER): Payer: BC Managed Care – PPO | Admitting: Family Medicine

## 2016-09-16 VITALS — BP 110/72 | HR 65 | Temp 98.0°F | Ht 64.0 in | Wt 133.5 lb

## 2016-09-16 DIAGNOSIS — J329 Chronic sinusitis, unspecified: Secondary | ICD-10-CM | POA: Diagnosis not present

## 2016-09-16 DIAGNOSIS — F4310 Post-traumatic stress disorder, unspecified: Secondary | ICD-10-CM | POA: Diagnosis not present

## 2016-09-16 DIAGNOSIS — I10 Essential (primary) hypertension: Secondary | ICD-10-CM | POA: Diagnosis not present

## 2016-09-16 MED ORDER — DOXYCYCLINE HYCLATE 100 MG PO TABS: 100.0000 mg | ORAL_TABLET | Freq: Two times a day (BID) | ORAL | 0 refills | Status: DC

## 2016-09-16 NOTE — Assessment & Plan Note (Signed)
Follows with [place], Pauline Good, NP for her psychiatric care.

## 2016-09-16 NOTE — Progress Notes (Signed)
Pre visit review using our clinic review tool, if applicable. No additional management support is needed unless otherwise documented below in the visit note. 

## 2016-09-16 NOTE — Assessment & Plan Note (Signed)
Encouraged increased rest and hydration, add probiotics, zinc such as Coldeze or Xicam. Treat fevers as needed. Doxycycline prn

## 2016-09-16 NOTE — Assessment & Plan Note (Signed)
Well controlled, no changes to meds. Encouraged heart healthy diet such as the DASH diet and exercise as tolerated.  °

## 2016-09-16 NOTE — Patient Instructions (Addendum)
Encouraged increased rest and hydration, add probiotics, zinc such as Coldeze or Xicam. Treat fevers as needed. Elderberry liquid, aged or black garlic, vitamin C 673 to 1000 mg daily.    Call insurance to confirm they will pay for annual blood work at your appointment for check up. Lipid, cmp, tsh, cbc Sinusitis, Adult Sinusitis is soreness and inflammation of your sinuses. Sinuses are hollow spaces in the bones around your face. Your sinuses are located:  Around your eyes.  In the middle of your forehead.  Behind your nose.  In your cheekbones.  Your sinuses and nasal passages are lined with a stringy fluid (mucus). Mucus normally drains out of your sinuses. When your nasal tissues become inflamed or swollen, the mucus can become trapped or blocked so air cannot flow through your sinuses. This allows bacteria, viruses, and funguses to grow, which leads to infection. Sinusitis can develop quickly and last for 7?10 days (acute) or for more than 12 weeks (chronic). Sinusitis often develops after a cold. What are the causes? This condition is caused by anything that creates swelling in the sinuses or stops mucus from draining, including:  Allergies.  Asthma.  Bacterial or viral infection.  Abnormally shaped bones between the nasal passages.  Nasal growths that contain mucus (nasal polyps).  Narrow sinus openings.  Pollutants, such as chemicals or irritants in the air.  A foreign object stuck in the nose.  A fungal infection. This is rare.  What increases the risk? The following factors may make you more likely to develop this condition:  Having allergies or asthma.  Having had a recent cold or respiratory tract infection.  Having structural deformities or blockages in your nose or sinuses.  Having a weak immune system.  Doing a lot of swimming or diving.  Overusing nasal sprays.  Smoking.  What are the signs or symptoms? The main symptoms of this condition are  pain and a feeling of pressure around the affected sinuses. Other symptoms include:  Upper toothache.  Earache.  Headache.  Bad breath.  Decreased sense of smell and taste.  A cough that may get worse at night.  Fatigue.  Fever.  Thick drainage from your nose. The drainage is often green and it may contain pus (purulent).  Stuffy nose or congestion.  Postnasal drip. This is when extra mucus collects in the throat or back of the nose.  Swelling and warmth over the affected sinuses.  Sore throat.  Sensitivity to light.  How is this diagnosed? This condition is diagnosed based on symptoms, a medical history, and a physical exam. To find out if your condition is acute or chronic, your health care provider may:  Look in your nose for signs of nasal polyps.  Tap over the affected sinus to check for signs of infection.  View the inside of your sinuses using an imaging device that has a light attached (endoscope).  If your health care provider suspects that you have chronic sinusitis, you may also:  Be tested for allergies.  Have a sample of mucus taken from your nose (nasal culture) and checked for bacteria.  Have a mucus sample examined to see if your sinusitis is related to an allergy.  If your sinusitis does not respond to treatment and it lasts longer than 8 weeks, you may have an MRI or CT scan to check your sinuses. These scans also help to determine how severe your infection is. In rare cases, a bone biopsy may be done to rule  out more serious types of fungal sinus disease. How is this treated? Treatment for sinusitis depends on the cause and whether your condition is chronic or acute. If a virus is causing your sinusitis, your symptoms will go away on their own within 10 days. You may be given medicines to relieve your symptoms, including:  Topical nasal decongestants. They shrink swollen nasal passages and let mucus drain from your sinuses.  Antihistamines.  These drugs block inflammation that is triggered by allergies. This can help to ease swelling in your nose and sinuses.  Topical nasal corticosteroids. These are nasal sprays that ease inflammation and swelling in your nose and sinuses.  Nasal saline washes. These rinses can help to get rid of thick mucus in your nose.  If your condition is caused by bacteria, you will be given an antibiotic medicine. If your condition is caused by a fungus, you will be given an antifungal medicine. Surgery may be needed to correct underlying conditions, such as narrow nasal passages. Surgery may also be needed to remove polyps. Follow these instructions at home: Medicines  Take, use, or apply over-the-counter and prescription medicines only as told by your health care provider. These may include nasal sprays.  If you were prescribed an antibiotic medicine, take it as told by your health care provider. Do not stop taking the antibiotic even if you start to feel better. Hydrate and Humidify  Drink enough water to keep your urine clear or pale yellow. Staying hydrated will help to thin your mucus.  Use a cool mist humidifier to keep the humidity level in your home above 50%.  Inhale steam for 10-15 minutes, 3-4 times a day or as told by your health care provider. You can do this in the bathroom while a hot shower is running.  Limit your exposure to cool or dry air. Rest  Rest as much as possible.  Sleep with your head raised (elevated).  Make sure to get enough sleep each night. General instructions  Apply a warm, moist washcloth to your face 3-4 times a day or as told by your health care provider. This will help with discomfort.  Wash your hands often with soap and water to reduce your exposure to viruses and other germs. If soap and water are not available, use hand sanitizer.  Do not smoke. Avoid being around people who are smoking (secondhand smoke).  Keep all follow-up visits as told by your  health care provider. This is important. Contact a health care provider if:  You have a fever.  Your symptoms get worse.  Your symptoms do not improve within 10 days. Get help right away if:  You have a severe headache.  You have persistent vomiting.  You have pain or swelling around your face or eyes.  You have vision problems.  You develop confusion.  Your neck is stiff.  You have trouble breathing. This information is not intended to replace advice given to you by your health care provider. Make sure you discuss any questions you have with your health care provider. Document Released: 01/25/2005 Document Revised: 09/21/2015 Document Reviewed: 11/20/2014 Elsevier Interactive Patient Education  2017 [employer].

## 2016-09-19 NOTE — Progress Notes (Signed)
Patient ID: Casey Rowe, female   DOB: 1950/02/26, 66 y.o.   MRN: [id number]   Subjective:    Patient ID: Casey Rowe, female    DOB: 03/09/1950, 66 y.o.   MRN: [id number]  Chief Complaint  Patient presents with  . Follow-up    6 month   . Sinusitis    HPI Patient is in today for follow up. Struggling with nasal congestion,headache and malaise x several weeks. Notes PND and cough. No recent hospitalizations. Denies CP/palp/SOB/HA/fevers/GI or GU c/o. Taking meds as prescribed. Has tried some otc meds without benefit  Past Medical History:  Diagnosis Date  . Allergy   . Anxiety   . Asthma   . Broken jaw ([place]) 1980s  . Depression   . Depression with anxiety   . Hypertension   . Preventative health care 03/16/2015  . Sarcoidosis   . Tubal pregnancy 1981    Past Surgical History:  Procedure Laterality Date  . ABDOMINAL HYSTERECTOMY    . CESAREAN SECTION    . ECTOPIC PREGNANCY SURGERY  1981  . FRACTURE SURGERY  1980s   broken jaw    Family History  Problem Relation Age of Onset  . Hypertension Mother   . Dementia Mother   . Diabetes Mother   . Diabetes Father   . Heart attack Father   . Heart disease Father   . Psoriasis Father   . Cancer Sister        pancreatic  . Hepatitis B Brother   . Rashes / Skin problems Son   . Alcohol abuse Sister        substance  . Gout Brother   . Hypertension Brother   . Other Son        bad allergies, aspirin, dermatitis  . Rashes / Skin problems Son   . Cancer Maternal Aunt        breast    Social History   Social History  . Marital status: Married    Spouse name: N/A  . Number of children: N/A  . Years of education: N/A   Occupational History  . Not on file.   Social History Main Topics  . Smoking status: Never Smoker  . Smokeless tobacco: Never Used  . Alcohol use Yes  . Drug use: No  . Sexual activity: Yes     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[employer], lives with husband, no dietary, minimal red meat   Other  Topics Concern  . 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 ADVAIR DISKUS 100-50 MCG/DOSE AEPB INHALE 1 PUFF INTO THE LUNGS 2 (TWO) TIMES DAILY. 60 each 2  . albuterol (PROVENTIL HFA;VENTOLIN HFA) 108 (90 Base) MCG/ACT inhaler Inhale 2 puffs into the lungs every 6 (six) hours as needed for wheezing. 1 Inhaler 0  . ALPRAZolam (XANAX) 0.25 MG tablet Take 0.25 mg by mouth daily.     Marland Kitchen azelastine (ASTELIN) 0.1 % nasal spray Place 2 sprays into both nostrils 2 (two) times daily. Use in each nostril as directed 30 mL 12  . benzonatate (TESSALON) 100 MG capsule Take 1 capsule (100 mg total) by mouth 3 (three) times daily as needed for cough. 21 capsule 0  . bisoprolol (ZEBETA) 5 MG tablet Take 1 tablet (5  mg total) by mouth daily. 90 tablet 1  . buPROPion (WELLBUTRIN XL) 300 MG 24 hr tablet Take 300 mg by mouth daily.    . busPIRone (BUSPAR) 5 MG tablet Take 5 mg by mouth 3 (three) times daily.     Marland Kitchen EST ESTROGENS-METHYLTEST HS 0.625-1.25 MG per tablet     . fluticasone (FLONASE) 50 MCG/ACT nasal spray Place 1 spray into both nostrils daily. (Patient taking differently: Place 1 spray into both nostrils 2 (two) times daily. ) 16 g 6  . losartan (COZAAR) 50 MG tablet Take 1 tablet (50 mg total) by mouth daily. 90 tablet 1  . methylPREDNISolone (MEDROL) 4 MG TBPK tablet dosepak as directed 21 tablet o  . predniSONE (DELTASONE) 10 MG tablet Take 1 tablet (10 mg total) by mouth daily with breakfast. 6 TAB PO DAY 1 5 TAB PO DAY 2 4 TAB PO DAY 3 3 TAB PO DAY 4 2 TAB PO DAY 5 1 TAB PO DAY 6 21 tablet 0  . predniSONE (DELTASONE) 5 MG tablet Take 1 tablet (5 mg total) by mouth as directed. Sterapred 21 generic taper 21 tablet 0  . budesonide (RHINOCORT AQUA) 32 MCG/ACT nasal spray Place 2 sprays into both nostrils daily. 8.6 g 5  . budesonide-formoterol  (SYMBICORT) 160-4.5 MCG/ACT inhaler Inhale 2 puffs into the lungs 2 (two) times daily. 1 Inhaler 12  . cefdinir (OMNICEF) 300 MG capsule Take 1 capsule (300 mg total) by mouth 2 (two) times daily. 20 capsule 0  . diclofenac sodium (VOLTAREN) 1 % GEL Apply 1 application topically 4 (four) times daily. 100 g 0  . doxycycline (VIBRA-TABS) 100 MG tablet Take 1 tablet (100 mg total) by mouth 2 (two) times daily. 20 tablet 0   No facility-administered medications prior to visit.     Allergies  Allergen Reactions  . Demerol Nausea And Vomiting  . Singulair [Montelukast]     Dry mouth/dehydration  . Augmentin [Amoxicillin-Pot Clavulanate] Diarrhea    Caused Diarrhea    Review of Systems  Constitutional: Negative for fever and malaise/fatigue.  HENT: Positive for congestion.   Eyes: Negative for blurred vision.  Respiratory: Negative for shortness of breath.   Cardiovascular: Negative for chest pain, palpitations and leg swelling.  Gastrointestinal: Negative for abdominal pain, blood in stool and nausea.  Genitourinary: Negative for dysuria and frequency.  Musculoskeletal: Negative for falls.  Skin: Negative for rash.  Neurological: Positive for headaches. Negative for dizziness and loss of consciousness.  Endo/Heme/Allergies: Negative for environmental allergies.  Psychiatric/Behavioral: Negative for depression. The patient is not nervous/anxious.        Objective:    Physical Exam  Constitutional: She is oriented to person, place, and time. She appears well-developed and well-nourished. No distress.  HENT:  Head: Normocephalic and atraumatic.  Nose: Nose normal.  Nasal mucosa boggy and erythematous  Eyes: Right eye exhibits no discharge. Left eye exhibits no discharge.  Neck: Normal range of motion. Neck supple.  Cardiovascular: Normal rate and regular rhythm.   No murmur heard. Pulmonary/Chest: Effort normal and breath sounds normal.  Abdominal: Soft. Bowel sounds are normal.  There is no tenderness.  Musculoskeletal: She exhibits no edema.  Neurological: She is alert and oriented to person, place, and time.  Skin: Skin is warm and dry.  Psychiatric: She has a normal mood and affect.  Nursing note and vitals reviewed.   BP 110/72 (BP Location: Left Arm, Patient Position: Sitting, Cuff Size: Normal)   Pulse 65  Temp 98 F (36.7 C) (Oral)   Ht 5\' 4"  (1.626 m)   Wt 133 lb 8 oz (60.6 kg)   LMP 02/08/1997   SpO2 98%   BMI 22.92 kg/m  Wt Readings from Last 3 Encounters:  09/16/16 133 lb 8 oz (60.6 kg)  03/16/16 134 lb 9.6 oz (61.1 kg)  01/29/16 134 lb 9.6 oz (61.1 kg)     Lab Results  Component Value Date   WBC 5.6 03/26/2015   HGB 13.3 03/26/2015   HCT 39.8 03/26/2015   PLT 210.0 03/26/2015   GLUCOSE 74 05/12/2016   CHOL 159 03/26/2015   TRIG 93.0 03/26/2015   HDL 38.30 (L) 03/26/2015   LDLCALC 102 (H) 03/26/2015   ALT 15 05/12/2016   AST 19 05/12/2016   NA 139 05/12/2016   K 4.9 05/12/2016   CL 105 05/12/2016   CREATININE 0.76 05/12/2016   BUN 16 05/12/2016   CO2 31 05/12/2016   TSH 1.64 03/26/2015    Lab Results  Component Value Date   TSH 1.64 03/26/2015   Lab Results  Component Value Date   WBC 5.6 03/26/2015   HGB 13.3 03/26/2015   HCT 39.8 03/26/2015   MCV 97.4 03/26/2015   PLT 210.0 03/26/2015   Lab Results  Component Value Date   NA 139 05/12/2016   K 4.9 05/12/2016   CO2 31 05/12/2016   GLUCOSE 74 05/12/2016   BUN 16 05/12/2016   CREATININE 0.76 05/12/2016   BILITOT 0.6 05/12/2016   ALKPHOS 40 05/12/2016   AST 19 05/12/2016   ALT 15 05/12/2016   PROT 6.7 05/12/2016   ALBUMIN 4.0 05/12/2016   CALCIUM 9.4 05/12/2016   GFR 81.51 05/12/2016   Lab Results  Component Value Date   CHOL 159 03/26/2015   Lab Results  Component Value Date   HDL 38.30 (L) 03/26/2015   Lab Results  Component Value Date   LDLCALC 102 (H) 03/26/2015   Lab Results  Component Value Date   TRIG 93.0 03/26/2015   Lab Results    Component Value Date   CHOLHDL 4 03/26/2015   No results found for: HGBA1C     Assessment & Plan:   Problem List Items Addressed This Visit    Sinusitis - Primary    Encouraged increased rest and hydration, add probiotics, zinc such as Coldeze or Xicam. Treat fevers as needed. Doxycycline prn      Relevant Medications   doxycycline (VIBRA-TABS) 100 MG tablet   Hypertension    Well controlled, no changes to meds. Encouraged heart healthy diet such as the DASH diet and exercise as tolerated.       PTSD (post-traumatic stress disorder)    Follows with [facility], Pauline Good, NP for her psychiatric care.         I have discontinued Ms. Beckner's budesonide-formoterol, budesonide, diclofenac sodium, cefdinir, and doxycycline. I am also having her start on doxycycline. Additionally, I am having her maintain her ALPRAZolam, busPIRone, EST ESTROGENS-METHYLTEST HS, fluticasone, ADVAIR DISKUS, buPROPion, predniSONE, benzonatate, albuterol, azelastine, losartan, bisoprolol, predniSONE, and methylPREDNISolone.  Meds ordered this encounter  Medications  . doxycycline (VIBRA-TABS) 100 MG tablet    Sig: Take 1 tablet (100 mg total) by mouth 2 (two) times daily.    Dispense:  28 tablet    Refill:  0    Penni Homans, MD

## 2016-10-27 ENCOUNTER — Other Ambulatory Visit (HOSPITAL_BASED_OUTPATIENT_CLINIC_OR_DEPARTMENT_OTHER): Payer: Self-pay | Admitting: Unknown Physician Specialty

## 2016-10-27 DIAGNOSIS — Z1231 Encounter for screening mammogram for malignant neoplasm of breast: Secondary | ICD-10-CM

## 2016-11-04 ENCOUNTER — Encounter (HOSPITAL_BASED_OUTPATIENT_CLINIC_OR_DEPARTMENT_OTHER): Payer: Self-pay

## 2016-11-04 ENCOUNTER — Ambulatory Visit (HOSPITAL_BASED_OUTPATIENT_CLINIC_OR_DEPARTMENT_OTHER)
Admission: RE | Admit: 2016-11-04 | Discharge: 2016-11-04 | Disposition: A | Discharge status: Home / Self Care | Payer: BC Managed Care – PPO | Source: Ambulatory Visit | Attending: Unknown Physician Specialty | Admitting: Unknown Physician Specialty

## 2016-11-04 DIAGNOSIS — Z1231 Encounter for screening mammogram for malignant neoplasm of breast: Secondary | ICD-10-CM | POA: Diagnosis present

## 2016-11-20 ENCOUNTER — Other Ambulatory Visit: Payer: Self-pay | Admitting: Family Medicine

## 2016-11-20 DIAGNOSIS — Z760 Encounter for issue of repeat prescription: Secondary | ICD-10-CM

## 2016-11-21 ENCOUNTER — Encounter (HOSPITAL_BASED_OUTPATIENT_CLINIC_OR_DEPARTMENT_OTHER): Payer: Self-pay | Admitting: Emergency Medicine

## 2016-11-21 ENCOUNTER — Emergency Department (HOSPITAL_BASED_OUTPATIENT_CLINIC_OR_DEPARTMENT_OTHER)
Admission: EM | Admit: 2016-11-21 | Discharge: 2016-11-21 | Disposition: A | Discharge status: Home / Self Care | Payer: Worker's Compensation | Attending: Emergency Medicine | Admitting: Emergency Medicine

## 2016-11-21 DIAGNOSIS — Z79899 Other long term (current) drug therapy: Secondary | ICD-10-CM | POA: Diagnosis not present

## 2016-11-21 DIAGNOSIS — M79605 Pain in left leg: Secondary | ICD-10-CM

## 2016-11-21 DIAGNOSIS — I10 Essential (primary) hypertension: Secondary | ICD-10-CM | POA: Diagnosis not present

## 2016-11-21 DIAGNOSIS — J45909 Unspecified asthma, uncomplicated: Secondary | ICD-10-CM | POA: Insufficient documentation

## 2016-11-21 DIAGNOSIS — W19XXXA Unspecified fall, initial encounter: Secondary | ICD-10-CM

## 2016-11-21 MED ORDER — METHYLPREDNISOLONE 4 MG PO TBPK: ORAL_TABLET | ORAL | 0 refills | Status: DC

## 2016-11-21 NOTE — ED Notes (Signed)
Patient stated that her left hip hurts and numbness all the way to her left left.

## 2016-11-21 NOTE — ED Triage Notes (Signed)
Patient states that she fell about 1 week ago at work. Patient continues to have pain and is now having a reaction to the motrin that she is taking

## 2016-11-21 NOTE — Discharge Instructions (Signed)
SEEK IMMEDIATE MEDICAL ATTENTION IF: New numbness, tingling, weakness, or problem with the use of your arms or legs.  Severe back pain not relieved with medications.  Change in bowel or bladder control.  Increasing pain in any areas of the body (such as chest or abdominal pain).  Shortness of breath, dizziness or fainting.  Nausea (feeling sick to your stomach), vomiting, fever, or sweats.  

## 2016-11-21 NOTE — ED Provider Notes (Signed)
[facility] Provider Note   CSN: [id number] Arrival date & time: 11/21/16  1112     History   Chief Complaint Chief Complaint  Patient presents with  . Fall    HPI Casey Rowe is a 66 y.o. femalepresents emergency department for evaluation of joint pain after fall. The fall occurred 10 days ago. Patient states that she was walking up the stairs and fell onto her knees and then hit her back on the landing.The patient states that she had bruising which has improved significantly however she has had a persistent aching pain from her hip down the front of her leg which is described as somewhat numb, intermittent, worse when lying flat or standing upright. She took a Motrin when she was in [place] last week which caused her to develop blood filled bulla on her arms. She states that this is happening in the past when she has tried to take a Motrin. She thinks it is an allergic reaction to Motrin. She denies any itching, swelling, throat closing, wheezing or other signs of anaphylaxis. Her blisters have resolved at this point. She denies any unilateral weakness, loss of control of her bowel or bladder.  HPI  Past Medical History:  Diagnosis Date  . Allergy   . Anxiety   . Asthma   . Broken jaw ([place]) 1980s  . Depression   . Depression with anxiety   . Hypertension   . Preventative health care 03/16/2015  . Sarcoidosis   . Tubal pregnancy 1981    Patient Active Problem List   Diagnosis Date Noted  . Preventative health care 03/16/2015  . Acute nonsuppurative otitis media of left ear 12/22/2014  . Weight loss 11/07/2013  . Asthma exacerbation 02/22/2012  . PTSD (post-traumatic stress disorder) 10/19/2011  . Atypical chest pain 02/10/2011  . Depression with anxiety   . Hypertension   . Extrinsic asthma   . Sarcoidosis   . Allergy   . Sinusitis 01/18/2011    Past Surgical History:  Procedure Laterality Date  . ABDOMINAL HYSTERECTOMY    . CESAREAN SECTION     . ECTOPIC PREGNANCY SURGERY  1981  . FRACTURE SURGERY  1980s   broken jaw    OB History    Gravida Para Term Preterm AB Living   6 3     3      SAB TAB Ectopic Multiple Live Births   2 1             Home Medications    Prior to Admission medications   Medication Sig Start Date End Date Taking? Authorizing Provider  ADVAIR DISKUS 100-50 MCG/DOSE AEPB INHALE 1 PUFF INTO THE LUNGS 2 (TWO) TIMES DAILY. 01/08/16   Mosie Lukes, MD  albuterol (PROVENTIL HFA;VENTOLIN HFA) 108 (90 Base) MCG/ACT inhaler Inhale 2 puffs into the lungs every 6 (six) hours as needed for wheezing. 03/16/16 03/16/17  Mosie Lukes, MD  ALPRAZolam Duanne Moron) 0.25 MG tablet Take 0.25 mg by mouth daily.     [provider]  azelastine (ASTELIN) 0.1 % nasal spray Place 2 sprays into both nostrils 2 (two) times daily. Use in each nostril as directed 03/16/16   Mosie Lukes, MD  benzonatate (TESSALON) 100 MG capsule Take 1 capsule (100 mg total) by mouth 3 (three) times daily as needed for cough. 03/16/16   Mosie Lukes, MD  bisoprolol (ZEBETA) 5 MG tablet Take 1 tablet (5 mg total) by mouth daily. 04/16/16  Mosie Lukes, MD  buPROPion (WELLBUTRIN XL) 300 MG 24 hr tablet Take 300 mg by mouth daily.    [provider]  busPIRone (BUSPAR) 5 MG tablet Take 5 mg by mouth 3 (three) times daily.     [provider]  doxycycline (VIBRA-TABS) 100 MG tablet Take 1 tablet (100 mg total) by mouth 2 (two) times daily. 09/16/16   Mosie Lukes, MD  EST ESTROGENS-METHYLTEST HS 0.625-1.25 MG per tablet  02/06/14   [provider]  fluticasone (FLONASE) 50 MCG/ACT nasal spray Place 1 spray into both nostrils daily. Patient taking differently: Place 1 spray into both nostrils 2 (two) times daily.  12/09/14   Mosie Lukes, MD  losartan (COZAAR) 50 MG tablet Take 1 tablet (50 mg total) by mouth daily. 04/16/16   Mosie Lukes, MD  methylPREDNISolone (MEDROL) 4 MG TBPK tablet dosepak as directed  08/03/16   Dutch Quint B, FNP  predniSONE (DELTASONE) 10 MG tablet Take 1 tablet (10 mg total) by mouth daily with breakfast. 6 TAB PO DAY 1 5 TAB PO DAY 2 4 TAB PO DAY 3 3 TAB PO DAY 4 2 TAB PO DAY 5 1 TAB PO DAY 6 01/29/16   Saguier, Percell Miller, PA-C  predniSONE (DELTASONE) 5 MG tablet Take 1 tablet (5 mg total) by mouth as directed. Sterapred 21 generic taper 05/24/16   [place], Elyse Jarvis, FNP    Family History Family History  Problem Relation Age of Onset  . Hypertension Mother   . Dementia Mother   . Diabetes Mother   . Diabetes Father   . Heart attack Father   . Heart disease Father   . Psoriasis Father   . Cancer Sister        pancreatic  . Hepatitis B Brother   . Rashes / Skin problems Son   . Alcohol abuse Sister        substance  . Gout Brother   . Hypertension Brother   . Other Son        bad allergies, aspirin, dermatitis  . Rashes / Skin problems Son   . Cancer Maternal Aunt        breast    Social History Social History  Substance Use Topics  . Smoking status: Never Smoker  . Smokeless tobacco: Never Used  . Alcohol use Yes     Allergies   Demerol; Ibuprofen; Singulair [montelukast]; and Augmentin [amoxicillin-pot clavulanate]   Review of Systems Review of Systems  Ten systems reviewed and are negative for acute change, except as noted in the HPI.   Physical Exam Updated Vital Signs BP 135/73 (BP Location: Left Arm)   Pulse 62   Temp 98.3 F (36.8 C) (Oral)   Resp 16   Ht 5\' 4"  (1.626 m)   Wt 60.3 kg (133 lb)   LMP 02/08/1997   SpO2 100%   BMI 22.83 kg/m   Physical Exam  Constitutional: She is oriented to person, place, and time. She appears well-developed and well-nourished. No distress.  HENT:  Head: Normocephalic and atraumatic.  Eyes: Conjunctivae are normal. No scleral icterus.  Neck: Normal range of motion.  Cardiovascular: Normal rate, regular rhythm and normal heart sounds.  Exam reveals no gallop and no friction rub.   No  murmur heard. Pulmonary/Chest: Effort normal and breath sounds normal. No respiratory distress.  Abdominal: Soft. Bowel sounds are normal. She exhibits no distension and no mass. There is no tenderness. There is no guarding.  Musculoskeletal:  No midline spinal tenderness, full range of motion of the lower back . Normal range of motion of bilateral hips and knees. Old bruising which is resolving over the bilateral knees.  Neurological: She is alert and oriented to person, place, and time.  Skin: Skin is warm and dry. She is not diaphoretic.  Psychiatric: Her behavior is normal.  Nursing note and vitals reviewed.    ED Treatments / Results  Labs (all labs ordered are listed, but only abnormal results are displayed) Labs Reviewed - No data to display  EKG  EKG Interpretation None       Radiology No results found.  Procedures Procedures (including critical care time)  Medications Ordered in ED Medications - No data to display   Initial Impression / Assessment and Plan / ED Course  I have reviewed the triage vital signs and the nursing notes.  Pertinent labs & imaging results that were available during my care of the patient were reviewed by me and considered in my medical decision making (see chart for details).     Patient with old injuries, normal range of motion, I doubt any fractures. Patient does not require any imaging at this time. I suspect she may have a little radiculopathy after her fall. She is not weak and is not having any difficulty ambulating. She has been able to exercise at her gym.We will add a Medrol Dosepak and she may take Tylenol for her pain. This should self resolve. No red flag symptoms. She appears safe for discharge at this time.  Final Clinical Impressions(s) / ED Diagnoses   Final diagnoses:  None    New Prescriptions New Prescriptions   No medications on file     Margarita Mail, PA-C 11/21/16 1649    Forde Dandy, MD 11/21/16  1719

## 2016-12-13 ENCOUNTER — Other Ambulatory Visit: Payer: Self-pay | Admitting: Family Medicine

## 2017-01-01 ENCOUNTER — Other Ambulatory Visit: Payer: Self-pay

## 2017-01-01 ENCOUNTER — Emergency Department (HOSPITAL_BASED_OUTPATIENT_CLINIC_OR_DEPARTMENT_OTHER)
Admission: EM | Admit: 2017-01-01 | Discharge: 2017-01-01 | Disposition: A | Discharge status: Home / Self Care | Payer: Worker's Compensation | Attending: Emergency Medicine | Admitting: Emergency Medicine

## 2017-01-01 ENCOUNTER — Encounter (HOSPITAL_BASED_OUTPATIENT_CLINIC_OR_DEPARTMENT_OTHER): Payer: Self-pay | Admitting: Emergency Medicine

## 2017-01-01 ENCOUNTER — Emergency Department (HOSPITAL_BASED_OUTPATIENT_CLINIC_OR_DEPARTMENT_OTHER): Payer: Worker's Compensation

## 2017-01-01 DIAGNOSIS — Y999 Unspecified external cause status: Secondary | ICD-10-CM | POA: Insufficient documentation

## 2017-01-01 DIAGNOSIS — J45909 Unspecified asthma, uncomplicated: Secondary | ICD-10-CM | POA: Insufficient documentation

## 2017-01-01 DIAGNOSIS — Y929 Unspecified place or not applicable: Secondary | ICD-10-CM | POA: Diagnosis not present

## 2017-01-01 DIAGNOSIS — Z79899 Other long term (current) drug therapy: Secondary | ICD-10-CM | POA: Insufficient documentation

## 2017-01-01 DIAGNOSIS — W010XXA Fall on same level from slipping, tripping and stumbling without subsequent striking against object, initial encounter: Secondary | ICD-10-CM | POA: Insufficient documentation

## 2017-01-01 DIAGNOSIS — M5416 Radiculopathy, lumbar region: Secondary | ICD-10-CM

## 2017-01-01 DIAGNOSIS — Y9301 Activity, walking, marching and hiking: Secondary | ICD-10-CM | POA: Diagnosis not present

## 2017-01-01 DIAGNOSIS — I10 Essential (primary) hypertension: Secondary | ICD-10-CM | POA: Diagnosis not present

## 2017-01-01 DIAGNOSIS — M549 Dorsalgia, unspecified: Secondary | ICD-10-CM | POA: Diagnosis present

## 2017-01-01 MED ORDER — CYCLOBENZAPRINE HCL 10 MG PO TABS: 10.0000 mg | ORAL_TABLET | Freq: Every evening | ORAL | 0 refills | Status: DC | PRN

## 2017-01-01 MED ORDER — PREDNISONE 50 MG PO TABS: ORAL_TABLET | ORAL | 0 refills | Status: DC

## 2017-01-01 MED ORDER — LIDOCAINE 4 % EX CREA: 1.0000 "application " | TOPICAL_CREAM | Freq: Three times a day (TID) | CUTANEOUS | 0 refills | Status: DC | PRN

## 2017-01-01 MED ORDER — PREDNISONE 50 MG PO TABS
ORAL_TABLET | ORAL | 0 refills | Status: DC
Start: 2017-01-01 — End: 2017-01-01

## 2017-01-01 MED ORDER — DICLOFENAC SODIUM 1 % TD GEL: 2.0000 g | Freq: Four times a day (QID) | TRANSDERMAL | 0 refills | Status: DC

## 2017-01-01 NOTE — Discharge Instructions (Signed)
Please follow with your primary care doctor in the next 2 days for a check-up. They must obtain records for further management.  ° °Do not hesitate to return to the Emergency Department for any new, worsening or concerning symptoms.  ° °

## 2017-01-01 NOTE — ED Provider Notes (Signed)
[facility] DEPARTMENT Provider Note   CSN: [id number] Arrival date & time: 01/01/17  0935     History   Chief Complaint Chief Complaint  Patient presents with  . Tailbone Pain     HPI  Blood pressure (!) 143/80, pulse 78, temperature 98 F (36.7 C), temperature source Oral, resp. rate 18, height 5\' 4"  (1.626 m), weight 60.3 kg (133 lb), last menstrual period 02/08/1997, SpO2 100 %.  Casey Rowe is a 66 y.o. female complaining of coccygeal and left gluteal pain status post fall on October 4 from standing.  She states that initially she had some back and knee pain this is resolved however the left-sided pain persists.  She denies any back pain numbness or weakness with this she was not troubled by any pain in this area before the fall.  She states that she went on a walk with her husband and the pain is severely exacerbated when she was walking up a hill.  She has been able to walk up stairs without difficulty.  She should not be taking ibuprofen but she took one before bedtime because the pain was so severe, she was able to sleep with that.  There is been no trauma after the initial fall several weeks ago.  Past Medical History:  Diagnosis Date  . Allergy   . Anxiety   . Asthma   . Broken jaw ([place]) 1980s  . Depression   . Depression with anxiety   . Hypertension   . Preventative health care 03/16/2015  . Sarcoidosis   . Tubal pregnancy 1981    Patient Active Problem List   Diagnosis Date Noted  . Preventative health care 03/16/2015  . Acute nonsuppurative otitis media of left ear 12/22/2014  . Weight loss 11/07/2013  . Asthma exacerbation 02/22/2012  . PTSD (post-traumatic stress disorder) 10/19/2011  . Atypical chest pain 02/10/2011  . Depression with anxiety   . Hypertension   . Extrinsic asthma   . Sarcoidosis   . Allergy   . Sinusitis 01/18/2011    Past Surgical History:  Procedure Laterality Date  . ABDOMINAL HYSTERECTOMY    .  CESAREAN SECTION    . ECTOPIC PREGNANCY SURGERY  1981  . FRACTURE SURGERY  1980s   broken jaw    OB History    Gravida Para Term Preterm AB Living   6 3     3      SAB TAB Ectopic Multiple Live Births   2 1             Home Medications    Prior to Admission medications   Medication Sig Start Date End Date Taking? Authorizing Provider  ADVAIR DISKUS 100-50 MCG/DOSE AEPB INHALE 1 PUFF INTO THE LUNGS 2 (TWO) TIMES DAILY. 11/23/16   Mosie Lukes, MD  albuterol (PROVENTIL HFA;VENTOLIN HFA) 108 (90 Base) MCG/ACT inhaler Inhale 2 puffs into the lungs every 6 (six) hours as needed for wheezing. 03/16/16 03/16/17  Mosie Lukes, MD  ALPRAZolam Duanne Moron) 0.25 MG tablet Take 0.25 mg by mouth daily.     [provider]  azelastine (ASTELIN) 0.1 % nasal spray Place 2 sprays into both nostrils 2 (two) times daily. Use in each nostril as directed 03/16/16   Mosie Lukes, MD  benzonatate (TESSALON) 100 MG capsule Take 1 capsule (100 mg total) by mouth 3 (three) times daily as needed for cough. 03/16/16   Mosie Lukes, MD  bisoprolol (ZEBETA) 5 MG tablet TAKE  1 TABLET (5 MG TOTAL) BY MOUTH DAILY. 12/14/16   Mosie Lukes, MD  buPROPion (WELLBUTRIN XL) 300 MG 24 hr tablet Take 300 mg by mouth daily.    [provider]  busPIRone (BUSPAR) 5 MG tablet Take 5 mg by mouth 3 (three) times daily.     [provider]  cyclobenzaprine (FLEXERIL) 10 MG tablet Take 1 tablet (10 mg total) by mouth at bedtime as needed for muscle spasms. 01/01/17   Belvie Iribe, Elmyra Ricks, PA-C  doxycycline (VIBRA-TABS) 100 MG tablet Take 1 tablet (100 mg total) by mouth 2 (two) times daily. 09/16/16   Mosie Lukes, MD  EST ESTROGENS-METHYLTEST HS 0.625-1.25 MG per tablet  02/06/14   [provider]  fluticasone (FLONASE) 50 MCG/ACT nasal spray Place 1 spray into both nostrils daily. Patient taking differently: Place 1 spray into both nostrils 2 (two) times daily.  12/09/14   Mosie Lukes, MD    lidocaine (LMX) 4 % cream Apply 1 application topically 3 (three) times daily as needed. 01/01/17   Avagail Whittlesey, Elmyra Ricks, PA-C  losartan (COZAAR) 50 MG tablet Take 1 tablet (50 mg total) by mouth daily. 04/16/16   Mosie Lukes, MD  methylPREDNISolone (MEDROL DOSEPAK) 4 MG TBPK tablet Use as directed 11/21/16   Margarita Mail, PA-C  predniSONE (DELTASONE) 50 MG tablet Take 1 tablet daily with breakfast 01/01/17   Trasean Delima, Elmyra Ricks, PA-C    Family History Family History  Problem Relation Age of Onset  . Hypertension Mother   . Dementia Mother   . Diabetes Mother   . Diabetes Father   . Heart attack Father   . Heart disease Father   . Psoriasis Father   . Cancer Sister        pancreatic  . Hepatitis B Brother   . Rashes / Skin problems Son   . Alcohol abuse Sister        substance  . Gout Brother   . Hypertension Brother   . Other Son        bad allergies, aspirin, dermatitis  . Rashes / Skin problems Son   . Cancer Maternal Aunt        breast    Social History Social History   Tobacco Use  . Smoking status: Never Smoker  . Smokeless tobacco: Never Used  Substance Use Topics  . Alcohol use: Yes  . Drug use: No     Allergies   Demerol; Ibuprofen; Singulair [montelukast]; and Augmentin [amoxicillin-pot clavulanate]   Review of Systems Review of Systems  A complete review of systems was obtained and all systems are negative except as noted in the HPI and PMH.    Physical Exam Updated Vital Signs BP (!) 143/80 (BP Location: Left Arm)   Pulse 78   Temp 98 F (36.7 C) (Oral)   Resp 18   Ht 5\' 4"  (1.626 m)   Wt 60.3 kg (133 lb)   LMP 02/08/1997   SpO2 100%   BMI 22.83 kg/m   Physical Exam  Constitutional: She is oriented to person, place, and time. She appears well-developed and well-nourished. No distress.  HENT:  Head: Normocephalic and atraumatic.  Mouth/Throat: Oropharynx is clear and moist.  Eyes: Conjunctivae and EOM are normal. Pupils are equal,  round, and reactive to light.  Neck: Normal range of motion.  Cardiovascular: Normal rate, regular rhythm and intact distal pulses.  Pulmonary/Chest: Effort normal and breath sounds normal.  Abdominal: Soft. There is no tenderness.  Musculoskeletal: Normal  range of motion. She exhibits no tenderness.  Full active range of motion to left hip  No point tenderness to percussion of lumbar spinal processes.  No TTP or paraspinal muscular spasm. Strength is 5 out of 5 to bilateral lower extremities at hip and knee; extensor hallucis longus 5 out of 5. Ankle strength 5 out of 5, no clonus, neurovascularly intact. No saddle anaesthesia. Patellar reflexes are slightly decreased bilaterally however patient is tensing her lower extremity musculature.    Straight leg raises negative bilaterally.   Neurological: She is alert and oriented to person, place, and time.  Skin: She is not diaphoretic.  Psychiatric: She has a normal mood and affect.  Nursing note and vitals reviewed.    ED Treatments / Results  Labs (all labs ordered are listed, but only abnormal results are displayed) Labs Reviewed - No data to display  EKG  EKG Interpretation None       Radiology Dg Lumbar Spine Complete  Result Date: 01/01/2017 CLINICAL DATA:  Fall down stairs 1 month ago. Persistent low back pain. Initial encounter. EXAM: LUMBAR SPINE - COMPLETE 4+ VIEW COMPARISON:  None. FINDINGS: There is no evidence of lumbar spine fracture. Alignment is normal. Intervertebral disc spaces are maintained. Transitional lumbosacral vertebra noted at S1. Mild bilateral facet DJD at L4-5 and L5-S1. IMPRESSION: No acute findings.  Mild lower lumbar facet DJD. Electronically Signed   By: Earle Gell M.D.   On: 01/01/2017 10:51   Dg Sacrum/coccyx  Result Date: 01/01/2017 CLINICAL DATA:  Fall down stairs 1 month ago. Persistent sacrococcygeal pain. Initial encounter. EXAM: SACRUM AND COCCYX - 2+ VIEW COMPARISON:  None. FINDINGS:  There is no evidence of fracture or other focal bone lesions. IMPRESSION: Negative. Electronically Signed   By: Earle Gell M.D.   On: 01/01/2017 10:49   Dg Hip Unilat W Or Wo Pelvis 2-3 Views Left  Result Date: 01/01/2017 CLINICAL DATA:  Left hip pain after fall last month. EXAM: DG HIP (WITH OR WITHOUT PELVIS) 2-3V LEFT COMPARISON:  None. FINDINGS: There is no evidence of hip fracture or dislocation. There is no evidence of arthropathy or other focal bone abnormality. IMPRESSION: Normal left hip. Electronically Signed   By: Marijo Conception, M.D.   On: 01/01/2017 10:47    Procedures Procedures (including critical care time)  Medications Ordered in ED Medications - No data to display   Initial Impression / Assessment and Plan / ED Course  I have reviewed the triage vital signs and the nursing notes.  Pertinent labs & imaging results that were available during my care of the patient were reviewed by me and considered in my medical decision making (see chart for details).     Vitals:   01/01/17 0941 01/01/17 0942  BP:  (!) 143/80  Pulse:  78  Resp:  18  Temp:  98 F (36.7 C)  TempSrc:  Oral  SpO2:  100%  Weight: 60.3 kg (133 lb)   Height: 5\' 4"  (1.626 m)     Casey Rowe is 66 y.o. female presenting with persistent left gluteal, coccygeal pain status post fall from standing 2 months ago.  The pain has not significantly increased but has not abated like her other musculoskeletal issues.  She took a walk with her husband and the pain was significantly exacerbated by trying to walk up a hill.  She has been able to ambulate and climb stairs without complication.  Physical exam reassuring, this may be a radiculopathy.  X-rays reassuring with no acute or remote fractures.  Given the persistent nature of her pain will refer to sports medicine.  Patient given prednisone burst, LidoCream lidocaine cream and Flexeril before bedtime as needed.  Evaluation does not show pathology that would  require ongoing emergent intervention or inpatient treatment. Pt is hemodynamically stable and mentating appropriately. Discussed findings and plan with patient/guardian, who agrees with care plan. All questions answered. Return precautions discussed and outpatient follow up given.      Final Clinical Impressions(s) / ED Diagnoses   Final diagnoses:  Lumbar radiculopathy    ED Discharge Orders        Ordered    predniSONE (DELTASONE) 50 MG tablet  Status:  Discontinued     01/01/17 1102    diclofenac sodium (VOLTAREN) 1 % GEL  4 times daily,   Status:  Discontinued     01/01/17 1102    predniSONE (DELTASONE) 50 MG tablet     01/01/17 1102    lidocaine (LMX) 4 % cream  3 times daily PRN     01/01/17 1102    cyclobenzaprine (FLEXERIL) 10 MG tablet  At bedtime PRN     01/01/17 1104       Empress Newmann, Charna Elizabeth 01/01/17 1119    Blanchie Dessert, MD 01/02/17 2028

## 2017-01-01 NOTE — ED Triage Notes (Signed)
Patient reports that she hurt her lower back and left hip a month ago at work. The patient reports that it is getting worse  - especially at night

## 2017-01-12 ENCOUNTER — Ambulatory Visit: Payer: BC Managed Care – PPO | Admitting: Psychology

## 2017-01-12 DIAGNOSIS — F331 Major depressive disorder, recurrent, moderate: Secondary | ICD-10-CM | POA: Diagnosis not present

## 2017-01-17 ENCOUNTER — Other Ambulatory Visit: Payer: Self-pay | Admitting: Family Medicine

## 2017-02-03 ENCOUNTER — Ambulatory Visit: Payer: BC Managed Care – PPO | Admitting: Psychology

## 2017-02-03 DIAGNOSIS — F331 Major depressive disorder, recurrent, moderate: Secondary | ICD-10-CM | POA: Diagnosis not present

## 2017-02-25 ENCOUNTER — Ambulatory Visit: Payer: BC Managed Care – PPO | Admitting: Psychology

## 2017-03-24 ENCOUNTER — Encounter: Payer: Self-pay | Admitting: Family Medicine

## 2017-03-24 ENCOUNTER — Ambulatory Visit (INDEPENDENT_AMBULATORY_CARE_PROVIDER_SITE_OTHER): Payer: BC Managed Care – PPO | Admitting: Family Medicine

## 2017-03-24 VITALS — BP 142/78 | HR 63 | Temp 97.8°F | Resp 18 | Ht 64.0 in | Wt 134.8 lb

## 2017-03-24 DIAGNOSIS — W19XXXS Unspecified fall, sequela: Secondary | ICD-10-CM

## 2017-03-24 DIAGNOSIS — E2839 Other primary ovarian failure: Secondary | ICD-10-CM | POA: Diagnosis not present

## 2017-03-24 DIAGNOSIS — Z0000 Encounter for general adult medical examination without abnormal findings: Secondary | ICD-10-CM

## 2017-03-24 DIAGNOSIS — I10 Essential (primary) hypertension: Secondary | ICD-10-CM | POA: Diagnosis not present

## 2017-03-24 DIAGNOSIS — R002 Palpitations: Secondary | ICD-10-CM | POA: Diagnosis not present

## 2017-03-24 DIAGNOSIS — G5763 Lesion of plantar nerve, bilateral lower limbs: Secondary | ICD-10-CM | POA: Diagnosis not present

## 2017-03-24 HISTORY — DX: Palpitations: R00.2

## 2017-03-24 LAB — CBC
HCT: 42.4 % (ref 36.0–46.0)
HEMOGLOBIN: 14.2 g/dL (ref 12.0–15.0)
MCHC: 33.5 g/dL (ref 30.0–36.0)
MCV: 101.2 fl — ABNORMAL HIGH (ref 78.0–100.0)
PLATELETS: 197 10*3/uL (ref 150.0–400.0)
RBC: 4.19 Mil/uL (ref 3.87–5.11)
RDW: 13.3 % (ref 11.5–15.5)
WBC: 5.2 10*3/uL (ref 4.0–10.5)

## 2017-03-24 LAB — COMPREHENSIVE METABOLIC PANEL
ALK PHOS: 31 U/L — AB (ref 39–117)
ALT: 15 U/L (ref 0–35)
AST: 16 U/L (ref 0–37)
Albumin: 4 g/dL (ref 3.5–5.2)
BILIRUBIN TOTAL: 0.6 mg/dL (ref 0.2–1.2)
BUN: 12 mg/dL (ref 6–23)
CO2: 31 mEq/L (ref 19–32)
Calcium: 9.3 mg/dL (ref 8.4–10.5)
Chloride: 104 mEq/L (ref 96–112)
Creatinine, Ser: 0.77 mg/dL (ref 0.40–1.20)
GFR: 80.07 mL/min (ref >60.00)
GLUCOSE: 84 mg/dL (ref 70–99)
Potassium: 4 mEq/L (ref 3.5–5.1)
Sodium: 139 mEq/L (ref 135–145)
TOTAL PROTEIN: 6.6 g/dL (ref 6.0–8.3)

## 2017-03-24 LAB — LIPID PANEL
Cholesterol: 136 mg/dL (ref 0–200)
HDL: 37.2 mg/dL — ABNORMAL LOW (ref >39.00)
LDL Cholesterol: 79 mg/dL (ref 0–99)
NONHDL: 99.01
Total CHOL/HDL Ratio: 4
Triglycerides: 98 mg/dL (ref 0.0–149.0)
VLDL: 19.6 mg/dL (ref 0.0–40.0)

## 2017-03-24 LAB — TSH: TSH: 1.06 u[IU]/mL (ref 0.35–4.50)

## 2017-03-24 MED ORDER — LOSARTAN POTASSIUM 50 MG PO TABS: 50.0000 mg | ORAL_TABLET | Freq: Every day | ORAL | 3 refills | Status: DC

## 2017-03-24 NOTE — Assessment & Plan Note (Addendum)
Check an echo. 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 Minimize caffeine and sodium.

## 2017-03-24 NOTE — Patient Instructions (Signed)
Lidocaine patches by Aspercreme, Icy Hot or Salon Pas at bedtime   Preventive Care 40-64 Years, Female Preventive care refers to lifestyle choices and visits with your health care provider that can promote health and wellness. What does preventive care include?  A yearly physical exam. This is also called an annual well check.  Dental exams once or twice a year.  Routine eye exams. Ask your health care provider how often you should have your eyes checked.  Personal lifestyle choices, including: ? Daily care of your teeth and gums. ? Regular physical activity. ? Eating a healthy diet. ? Avoiding tobacco and drug use. ? Limiting alcohol use. ? Practicing safe sex. ? Taking low-dose aspirin daily starting at age 71. ? Taking vitamin and mineral supplements as recommended by your health care provider. What happens during an annual well check? The services and screenings done by your health care provider during your annual well check will depend on your age, overall health, lifestyle risk factors, and family history of disease. Counseling Your health care provider may ask you questions about your:  Alcohol use.  Tobacco use.  Drug use.  Emotional well-being.  Home and relationship well-being.  Sexual activity.  Eating habits.  Work and work Statistician.  Method of birth control.  Menstrual cycle.  Pregnancy history.  Screening You may have the following tests or measurements:  Height, weight, and BMI.  Blood pressure.  Lipid and cholesterol levels. These may be checked every 5 years, or more frequently if you are over 45 years old.  Skin check.  Lung cancer screening. You may have this screening every year starting at age 8 if you have a 30-pack-year history of smoking and currently smoke or have quit within the past 15 years.  Fecal occult blood test (FOBT) of the stool. You may have this test every year starting at age 26.  Flexible sigmoidoscopy or  colonoscopy. You may have a sigmoidoscopy every 5 years or a colonoscopy every 10 years starting at age 74.  Hepatitis C blood test.  Hepatitis B blood test.  Sexually transmitted disease (STD) testing.  Diabetes screening. This is done by checking your blood sugar (glucose) after you have not eaten for a while (fasting). You may have this done every 1-3 years.  Mammogram. This may be done every 1-2 years. Talk to your health care provider about when you should start having regular mammograms. This may depend on whether you have a family history of breast cancer.  BRCA-related cancer screening. This may be done if you have a family history of breast, ovarian, tubal, or peritoneal cancers.  Pelvic exam and Pap test. This may be done every 3 years starting at age 40. Starting at age 11, this may be done every 5 years if you have a Pap test in combination with an HPV test.  Bone density scan. This is done to screen for osteoporosis. You may have this scan if you are at high risk for osteoporosis.  Discuss your test results, treatment options, and if necessary, the need for more tests with your health care provider. Vaccines Your health care provider may recommend certain vaccines, such as:  Influenza vaccine. This is recommended every year.  Tetanus, diphtheria, and acellular pertussis (Tdap, Td) vaccine. You may need a Td booster every 10 years.  Varicella vaccine. You may need this if you have not been vaccinated.  Zoster vaccine. You may need this after age 60.  Measles, mumps, and rubella (MMR) vaccine. You  may need at least one dose of MMR if you were born in 1957 or later. You may also need a second dose.  Pneumococcal 13-valent conjugate (PCV13) vaccine. You may need this if you have certain conditions and were not previously vaccinated.  Pneumococcal polysaccharide (PPSV23) vaccine. You may need one or two doses if you smoke cigarettes or if you have certain  conditions.  Meningococcal vaccine. You may need this if you have certain conditions.  Hepatitis A vaccine. You may need this if you have certain conditions or if you travel or work in places where you may be exposed to hepatitis A.  Hepatitis B vaccine. You may need this if you have certain conditions or if you travel or work in places where you may be exposed to hepatitis B.  Haemophilus influenzae type b (Hib) vaccine. You may need this if you have certain conditions.  Talk to your health care provider about which screenings and vaccines you need and how often you need them. This information is not intended to replace advice given to you by your health care provider. Make sure you discuss any questions you have with your health care provider. Document Released: 02/21/2015 Document Revised: 10/15/2015 Document Reviewed: 11/26/2014 Elsevier Interactive Patient Education  [employer].

## 2017-03-24 NOTE — Assessment & Plan Note (Signed)
Well controlled, no changes to meds. Encouraged heart healthy diet such as the DASH diet and exercise as tolerated.  °

## 2017-03-24 NOTE — Progress Notes (Signed)
Subjective:  I acted as a Education administrator for Dr. Charlett Blake. [place], [place]  Patient ID: Casey Rowe, female    DOB: May 11, 1950, 67 y.o.   MRN: [id number]  No chief complaint on file.   HPI  Patient is in today for an annual exam and follow up on chronic medical concerns including hypertension, palpitations, asthma, and sarcoidosis. She feels well today. She had a bad fall at work in October and has been following with workman's comp. Has some persistnet joint pains but she denies any known fractures. She has pain in her feet and has been diagnosed with Morton's Neuromas and is considering surgery. She is also putting off Left hip replacement due to her pain. She notes her palpitations have begun to occur more often roughly 3 x a week and to last longer up to 30 minutes. No associated symptoms. Is doing well with activities of dailyu living. She tries to eat well. Exercise is difficult due to her pain. Denies CP/SOB/HA/congestion/fevers/GI or GU c/o. Taking meds as prescribed  Patient Care Team: Mosie Lukes, MD as PCP - General (Family Medicine) Hinton Rao, MD (Unknown Physician Specialty)   Past Medical History:  Diagnosis Date  . Allergy   . Anxiety   . Asthma   . Broken jaw ([place]) 1980s  . Depression   . Depression with anxiety   . Hypertension   . Preventative health care 03/16/2015  . Sarcoidosis   . Tubal pregnancy 1981    Past Surgical History:  Procedure Laterality Date  . ABDOMINAL HYSTERECTOMY    . CESAREAN SECTION    . ECTOPIC PREGNANCY SURGERY  1981  . FRACTURE SURGERY  1980s   broken jaw    Family History  Problem Relation Age of Onset  . Hypertension Mother   . Dementia Mother   . Diabetes Mother   . Diabetes Father   . Heart attack Father   . Heart disease Father   . Psoriasis Father   . Cancer Sister        pancreatic  . Hepatitis B Brother   . Rashes / Skin problems Son   . Alcohol abuse Sister        substance  . Gout Brother   . Hypertension  Brother   . Other Son        bad allergies, aspirin, dermatitis  . Rashes / Skin problems Son   . Cancer Maternal Aunt        breast    Social History   Socioeconomic History  . Marital status: Married    Spouse name: Not on file  . Number of children: Not on file  . Years of education: Not on file  . Highest education level: Not on file  Social Needs  . Financial resource strain: Not on file  . Food insecurity - worry: Not on file  . Food insecurity - inability: Not on file  . Transportation needs - medical: Not on file  . Transportation needs - non-medical: Not on file  Occupational History  . Not on file  Tobacco Use  . Smoking status: Never Smoker  . Smokeless tobacco: Never Used  Substance and Sexual Activity  . Alcohol use: Yes  . Drug use: No  . 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 Not on file  Social History Narrative   Works as a Pharmacist, 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  [employer])   Married   Grown children- 3          Outpatient Medications Prior to Visit  Medication Sig Dispense Refill  . ADVAIR DISKUS 100-50 MCG/DOSE AEPB INHALE 1 PUFF INTO THE LUNGS 2 (TWO) TIMES DAILY. 60 each 1  . albuterol (PROVENTIL HFA;VENTOLIN HFA) 108 (90 Base) MCG/ACT inhaler Inhale 2 puffs into the lungs every 6 (six) hours as needed for wheezing. 1 Inhaler 0  . ALPRAZolam (XANAX) 0.25 MG tablet Take 0.25 mg by mouth daily.     Marland Kitchen azelastine (ASTELIN) 0.1 % nasal spray Place 2 sprays into both nostrils 2 (two) times daily. Use in each nostril as directed 30 mL 12  . benzonatate (TESSALON) 100 MG capsule Take 1 capsule (100 mg total) by mouth 3 (three) times daily as needed for cough. 21 capsule 0  . bisoprolol (ZEBETA) 5 MG tablet TAKE 1 TABLET (5 MG TOTAL) BY MOUTH DAILY. 60 tablet 3  . buPROPion (WELLBUTRIN XL) 300 MG 24 hr tablet Take 300 mg by mouth daily.    . busPIRone (BUSPAR) 5 MG tablet Take 5 mg by  mouth 3 (three) times daily.     . cyclobenzaprine (FLEXERIL) 10 MG tablet Take 1 tablet (10 mg total) by mouth at bedtime as needed for muscle spasms. 20 tablet 0  . doxycycline (VIBRA-TABS) 100 MG tablet Take 1 tablet (100 mg total) by mouth 2 (two) times daily. 28 tablet 0  . EST ESTROGENS-METHYLTEST HS 0.625-1.25 MG per tablet     . fluticasone (FLONASE) 50 MCG/ACT nasal spray Place 1 spray into both nostrils daily. (Patient taking differently: Place 1 spray into both nostrils 2 (two) times daily. ) 16 g 6  . lidocaine (LMX) 4 % cream Apply 1 application topically 3 (three) times daily as needed. 133 g 0  . methylPREDNISolone (MEDROL DOSEPAK) 4 MG TBPK tablet Use as directed 21 tablet 0  . predniSONE (DELTASONE) 50 MG tablet Take 1 tablet daily with breakfast 5 tablet 0  . losartan (COZAAR) 50 MG tablet TAKE 1 TABLET (50 MG TOTAL) BY MOUTH DAILY. 90 tablet 3   No facility-administered medications prior to visit.     Allergies  Allergen Reactions  . Demerol Nausea And Vomiting  . Ibuprofen Other (See Comments)    bruises  . Singulair [Montelukast]     Dry mouth/dehydration  . Augmentin [Amoxicillin-Pot Clavulanate] Diarrhea    Caused Diarrhea    Review of Systems  Constitutional: Negative for chills, fever and malaise/fatigue.  HENT: Negative for congestion and hearing loss.   Eyes: Negative for discharge.  Respiratory: Negative for cough, sputum production and shortness of breath.   Cardiovascular: Positive for palpitations. Negative for chest pain and leg swelling.  Gastrointestinal: Negative for abdominal pain, blood in stool, constipation, diarrhea, heartburn, nausea and vomiting.  Genitourinary: Negative for dysuria, frequency, hematuria and urgency.  Musculoskeletal: Positive for joint pain. Negative for back pain, falls and myalgias.  Skin: Negative for rash.  Neurological: Negative for dizziness, sensory change, loss of consciousness, weakness and headaches.    Endo/Heme/Allergies: Negative for environmental allergies. Does not bruise/bleed easily.  Psychiatric/Behavioral: Negative for depression and suicidal ideas. The patient is not nervous/anxious and does not have insomnia.        Objective:    Physical Exam  Constitutional: She is oriented to person, place, and time. She appears well-developed and well-nourished. No distress.  HENT:  Head: Normocephalic and atraumatic.  Eyes: Conjunctivae are normal.  Neck: Neck supple. No  thyromegaly present.  Cardiovascular: Normal rate, regular rhythm and normal heart sounds.  No murmur heard. RRR with ectopic beats at times.   Pulmonary/Chest: Effort normal and breath sounds normal. No respiratory distress.  Abdominal: Soft. Bowel sounds are normal. She exhibits no distension and no mass. There is no tenderness.  Musculoskeletal: She exhibits no edema.  Lymphadenopathy:    She has no cervical adenopathy.  Neurological: She is alert and oriented to person, place, and time.  Skin: Skin is warm and dry.  Psychiatric: She has a normal mood and affect. Her behavior is normal.    BP (!) 142/78 (BP Location: Left Arm, Patient Position: Sitting, Cuff Size: Normal)   Pulse 63   Temp 97.8 F (36.6 C) (Oral)   Resp 18   Ht 5\' 4"  (1.626 m)   Wt 134 lb 12.8 oz (61.1 kg)   LMP 02/08/1997   SpO2 97%   BMI 23.14 kg/m  Wt Readings from Last 3 Encounters:  03/24/17 134 lb 12.8 oz (61.1 kg)  01/01/17 133 lb (60.3 kg)  11/21/16 133 lb (60.3 kg)   BP Readings from Last 3 Encounters:  03/24/17 (!) 142/78  01/01/17 (!) 143/80  11/21/16 140/82     Immunization History  Administered Date(s) Administered  . DTaP 08/09/2010  . Influenza Split 02/22/2011  . Influenza,inj,Quad PF,6+ Mos 01/30/2014, 03/11/2015, 03/16/2016  . Zoster 03/11/2015    Health Maintenance  Topic Date Due  . HEMOGLOBIN A1C  Nov 17, 1950  . FOOT EXAM  09/01/1962  . OPHTHALMOLOGY EXAM  09/01/1962  . HIV Screening  09/01/1967   . PAP SMEAR  07/09/2013  . INFLUENZA VACCINE  09/08/2016  . Fecal DNA (Cologuard)  03/30/2018  . MAMMOGRAM  11/05/2018  . TETANUS/TDAP  08/08/2020  . Hepatitis C Screening  Completed    Lab Results  Component Value Date   WBC 5.2 03/24/2017   HGB 14.2 03/24/2017   HCT 42.4 03/24/2017   PLT 197.0 03/24/2017   GLUCOSE 84 03/24/2017   CHOL 136 03/24/2017   TRIG 98.0 03/24/2017   HDL 37.20 (L) 03/24/2017   LDLCALC 79 03/24/2017   ALT 15 03/24/2017   AST 16 03/24/2017   NA 139 03/24/2017   K 4.0 03/24/2017   CL 104 03/24/2017   CREATININE 0.77 03/24/2017   BUN 12 03/24/2017   CO2 31 03/24/2017   TSH 1.06 03/24/2017    Lab Results  Component Value Date   TSH 1.06 03/24/2017   Lab Results  Component Value Date   WBC 5.2 03/24/2017   HGB 14.2 03/24/2017   HCT 42.4 03/24/2017   MCV 101.2 (H) 03/24/2017   PLT 197.0 03/24/2017   Lab Results  Component Value Date   NA 139 03/24/2017   K 4.0 03/24/2017   CO2 31 03/24/2017   GLUCOSE 84 03/24/2017   BUN 12 03/24/2017   CREATININE 0.77 03/24/2017   BILITOT 0.6 03/24/2017   ALKPHOS 31 (L) 03/24/2017   AST 16 03/24/2017   ALT 15 03/24/2017   PROT 6.6 03/24/2017   ALBUMIN 4.0 03/24/2017   CALCIUM 9.3 03/24/2017   GFR 80.07 03/24/2017   Lab Results  Component Value Date   CHOL 136 03/24/2017   Lab Results  Component Value Date   HDL 37.20 (L) 03/24/2017   Lab Results  Component Value Date   LDLCALC 79 03/24/2017   Lab Results  Component Value Date   TRIG 98.0 03/24/2017   Lab Results  Component Value Date   CHOLHDL 4  03/24/2017   No results found for: HGBA1C       Assessment & Plan:   Problem List Items Addressed This Visit    Hypertension    Well controlled, no changes to meds. Encouraged heart healthy diet such as the DASH diet and exercise as tolerated.       Relevant Medications   losartan (COZAAR) 50 MG tablet   Other Relevant Orders   CBC (Completed)   Comprehensive metabolic panel  (Completed)   Lipid panel (Completed)   TSH (Completed)   Preventative health care - Primary    Patient encouraged to maintain heart healthy diet, regular exercise, adequate sleep. Consider daily probiotics. Take medications as prescribed      Relevant Orders   CBC (Completed)   Comprehensive metabolic panel (Completed)   Lipid panel (Completed)   TSH (Completed)   Palpitation    Check an echo. 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 Minimize caffeine and sodium.       Relevant Orders   ECHOCARDIOGRAM COMPLETE   CBC (Completed)   Comprehensive metabolic panel (Completed)   TSH (Completed)   EKG 12-Lead (Completed)   Fall    Fell at work on 11/09/16 and has been following with [employer] comp. She fell up some steps onto her knees and has been left with chronic pain.        Other Visit Diagnoses    Estrogen deficiency       Relevant Orders   DG Bone Density      I am having Merilee L. Kitch maintain her ALPRAZolam, busPIRone, EST ESTROGENS-METHYLTEST HS, fluticasone, buPROPion, benzonatate, albuterol, azelastine, doxycycline, ADVAIR DISKUS, methylPREDNISolone, bisoprolol, predniSONE, lidocaine, cyclobenzaprine, and losartan.  Meds ordered this encounter  Medications  . losartan (COZAAR) 50 MG tablet    Sig: Take 1 tablet (50 mg total) by mouth daily.    Dispense:  90 tablet    Refill:  3    CMA served as scribe during this visit. History, Physical and Plan performed by medical provider. Documentation and orders reviewed and attested to.  Penni Homans, MD

## 2017-03-27 DIAGNOSIS — Y92009 Unspecified place in unspecified non-institutional (private) residence as the place of occurrence of the external cause: Secondary | ICD-10-CM | POA: Insufficient documentation

## 2017-03-27 DIAGNOSIS — W19XXXA Unspecified fall, initial encounter: Secondary | ICD-10-CM | POA: Insufficient documentation

## 2017-03-27 DIAGNOSIS — G5763 Lesion of plantar nerve, bilateral lower limbs: Secondary | ICD-10-CM

## 2017-03-27 HISTORY — DX: Lesion of plantar nerve, bilateral lower limbs: G57.63

## 2017-03-27 NOTE — Assessment & Plan Note (Signed)
Fell at work on 11/09/16 and has been following with [employer] comp. She fell up some steps onto her knees and has been left with chronic pain.

## 2017-03-27 NOTE — Assessment & Plan Note (Signed)
Following with podiatry and is considering surgery

## 2017-03-27 NOTE — Assessment & Plan Note (Signed)
Patient encouraged to maintain heart healthy diet, regular exercise, adequate sleep. Consider daily probiotics. Take medications as prescribed 

## 2017-04-20 ENCOUNTER — Encounter (HOSPITAL_BASED_OUTPATIENT_CLINIC_OR_DEPARTMENT_OTHER): Payer: Self-pay

## 2017-04-20 ENCOUNTER — Ambulatory Visit (HOSPITAL_BASED_OUTPATIENT_CLINIC_OR_DEPARTMENT_OTHER): Payer: BC Managed Care – PPO

## 2017-04-21 ENCOUNTER — Other Ambulatory Visit: Payer: Self-pay | Admitting: Family Medicine

## 2017-04-21 NOTE — Progress Notes (Signed)
Got a fax from her pharmacy about her losartan being recalled.  Pt has a few days left, was thinking of changing to a different medication anyway with Dr. B, but she is not sure of the name and I do not see in note.    Will send a message to Dr. B and then can replace med for her.  I am also not clear on what pharmacy this fax came from- it does not say.

## 2017-05-17 ENCOUNTER — Telehealth: Payer: Self-pay

## 2017-05-17 NOTE — Telephone Encounter (Signed)
Lipoma's generally not serious but do not see the MRI results if she had it elsewhere we need a copy of report if highly sympotmatic can see a surgeon to discuss removal. For the Losartan we just have to see what her pharmacy has. Do they have the Losartan 25 mg tabs, can take 2 tabs daily. Do they have the 100 mg tabs, take 1/2 tab daily? If they do not have any Losartan can switch to Telmisartan 40 mg daily or Irbesartan 150 mg daily depending on what they have available. Then have rn bp check in 3-4 weeks with cmp

## 2017-05-17 NOTE — Telephone Encounter (Signed)
Copied from [place] [id number]. 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

## 2017-05-23 ENCOUNTER — Other Ambulatory Visit: Payer: Self-pay | Admitting: Family Medicine

## 2017-05-23 DIAGNOSIS — D1720 Benign lipomatous neoplasm of skin and subcutaneous tissue of unspecified limb: Secondary | ICD-10-CM

## 2017-05-23 NOTE — Telephone Encounter (Signed)
Spoke with patient she has agreed with the referral to [employer] [facility].  She wanted you to be aware she needs her appointment late in the Summer due to having surgery May 20

## 2017-05-23 NOTE — Telephone Encounter (Signed)
done

## 2017-06-10 ENCOUNTER — Other Ambulatory Visit: Payer: Self-pay | Admitting: Family Medicine

## 2017-06-15 DIAGNOSIS — D171 Benign lipomatous neoplasm of skin and subcutaneous tissue of trunk: Secondary | ICD-10-CM | POA: Insufficient documentation

## 2017-07-14 ENCOUNTER — Ambulatory Visit: Payer: BC Managed Care – PPO | Admitting: Psychology

## 2017-07-14 DIAGNOSIS — F331 Major depressive disorder, recurrent, moderate: Secondary | ICD-10-CM | POA: Diagnosis not present

## 2017-08-07 ENCOUNTER — Other Ambulatory Visit: Payer: Self-pay | Admitting: Family Medicine

## 2017-08-07 DIAGNOSIS — Z0000 Encounter for general adult medical examination without abnormal findings: Secondary | ICD-10-CM

## 2017-08-26 ENCOUNTER — Ambulatory Visit: Payer: BC Managed Care – PPO | Admitting: Psychology

## 2017-10-26 ENCOUNTER — Other Ambulatory Visit (HOSPITAL_BASED_OUTPATIENT_CLINIC_OR_DEPARTMENT_OTHER): Payer: Self-pay | Admitting: Podiatry

## 2017-10-26 DIAGNOSIS — I999 Unspecified disorder of circulatory system: Secondary | ICD-10-CM

## 2017-11-09 ENCOUNTER — Encounter (HOSPITAL_BASED_OUTPATIENT_CLINIC_OR_DEPARTMENT_OTHER): Payer: Self-pay

## 2017-11-17 DIAGNOSIS — S99929A Unspecified injury of unspecified foot, initial encounter: Secondary | ICD-10-CM | POA: Insufficient documentation

## 2017-11-17 DIAGNOSIS — M792 Neuralgia and neuritis, unspecified: Secondary | ICD-10-CM | POA: Insufficient documentation

## 2017-11-17 DIAGNOSIS — G5780 Other specified mononeuropathies of unspecified lower limb: Secondary | ICD-10-CM | POA: Insufficient documentation

## 2017-11-17 HISTORY — DX: Neuralgia and neuritis, unspecified: M79.2

## 2017-11-17 HISTORY — DX: Other specified mononeuropathies of unspecified lower limb: G57.80

## 2017-11-17 HISTORY — DX: Unspecified injury of unspecified foot, initial encounter: S99.929A

## 2017-11-26 ENCOUNTER — Other Ambulatory Visit: Payer: Self-pay | Admitting: Family Medicine

## 2017-12-23 ENCOUNTER — Other Ambulatory Visit: Payer: Self-pay | Admitting: Family Medicine

## 2017-12-23 DIAGNOSIS — Z760 Encounter for issue of repeat prescription: Secondary | ICD-10-CM

## 2017-12-28 DIAGNOSIS — L84 Corns and callosities: Secondary | ICD-10-CM | POA: Insufficient documentation

## 2017-12-28 DIAGNOSIS — M7741 Metatarsalgia, right foot: Secondary | ICD-10-CM

## 2017-12-28 HISTORY — DX: Metatarsalgia, right foot: M77.41

## 2017-12-28 HISTORY — DX: Corns and callosities: L84

## 2018-01-11 ENCOUNTER — Other Ambulatory Visit (HOSPITAL_BASED_OUTPATIENT_CLINIC_OR_DEPARTMENT_OTHER): Payer: Self-pay | Admitting: Unknown Physician Specialty

## 2018-01-11 DIAGNOSIS — Z1231 Encounter for screening mammogram for malignant neoplasm of breast: Secondary | ICD-10-CM

## 2018-01-13 ENCOUNTER — Ambulatory Visit (HOSPITAL_BASED_OUTPATIENT_CLINIC_OR_DEPARTMENT_OTHER)
Admission: RE | Admit: 2018-01-13 | Discharge: 2018-01-13 | Disposition: A | Discharge status: Home / Self Care | Payer: BC Managed Care – PPO | Source: Ambulatory Visit | Attending: Unknown Physician Specialty | Admitting: Unknown Physician Specialty

## 2018-01-13 DIAGNOSIS — Z1231 Encounter for screening mammogram for malignant neoplasm of breast: Secondary | ICD-10-CM

## 2018-03-22 ENCOUNTER — Other Ambulatory Visit: Payer: Self-pay | Admitting: Family Medicine

## 2018-03-22 DIAGNOSIS — Z760 Encounter for issue of repeat prescription: Secondary | ICD-10-CM

## 2018-05-02 ENCOUNTER — Encounter: Payer: BC Managed Care – PPO | Admitting: Family Medicine

## 2018-05-02 ENCOUNTER — Encounter

## 2018-06-26 ENCOUNTER — Other Ambulatory Visit: Payer: Self-pay | Admitting: Family Medicine

## 2018-06-26 DIAGNOSIS — Z760 Encounter for issue of repeat prescription: Secondary | ICD-10-CM

## 2018-06-28 ENCOUNTER — Telehealth: Payer: BC Managed Care – PPO | Admitting: Family

## 2018-06-28 DIAGNOSIS — B9689 Other specified bacterial agents as the cause of diseases classified elsewhere: Secondary | ICD-10-CM | POA: Diagnosis not present

## 2018-06-28 DIAGNOSIS — J208 Acute bronchitis due to other specified organisms: Secondary | ICD-10-CM | POA: Diagnosis not present

## 2018-06-28 DIAGNOSIS — J45901 Unspecified asthma with (acute) exacerbation: Secondary | ICD-10-CM

## 2018-06-28 MED ORDER — DOXYCYCLINE HYCLATE 100 MG PO TABS: 100.0000 mg | ORAL_TABLET | Freq: Two times a day (BID) | ORAL | 0 refills | Status: DC

## 2018-06-28 MED ORDER — PREDNISONE 10 MG (21) PO TBPK: ORAL_TABLET | ORAL | 0 refills | Status: DC

## 2018-06-28 NOTE — Progress Notes (Signed)
We are sorry that you are not feeling well.  Here is how we plan to help!  Approximately 5 minutes was spent documenting and reviewing patient's chart.    Based on your presentation I believe you most likely have A cough due to bacteria.  When patients have a fever and a productive cough with a change in color or increased sputum production, we are concerned about bacterial bronchitis.  If left untreated it can progress to pneumonia.  If your symptoms do not improve with your treatment plan it is important that you contact your provider.   I have prescribed Doxycycline 100 mg twice a day for 7 days     In addition you may use A non-prescription cough medication called Robitussin DAC. Take 2 teaspoons every 8 hours or Delsym: take 2 teaspoons every 12 hours. and A non-prescription cough medication called Mucinex DM: take 2 tablets every 12 hours.  Prednisone 10 mg daily for 6 days (see taper instructions below)  Directions for 6 day taper: Day 1: 2 tablets before breakfast, 1 after both lunch & dinner and 2 at bedtime Day 2: 1 tab before breakfast, 1 after both lunch & dinner and 2 at bedtime Day 3: 1 tab at each meal & 1 at bedtime Day 4: 1 tab at breakfast, 1 at lunch, 1 at bedtime Day 5: 1 tab at breakfast & 1 tab at bedtime Day 6: 1 tab at breakfast   From your responses in the eVisit questionnaire you describe inflammation in the upper respiratory tract which is causing a significant cough.  This is commonly called Bronchitis and has four common causes:    Allergies  Viral Infections  Acid Reflux  Bacterial Infection Allergies, viruses and acid reflux are treated by controlling symptoms or eliminating the cause. An example might be a cough caused by taking certain blood pressure medications. You stop the cough by changing the medication. Another example might be a cough caused by acid reflux. Controlling the reflux helps control the cough.  USE OF BRONCHODILATOR ("RESCUE")  INHALERS: There is a risk from using your bronchodilator too frequently.  The risk is that over-reliance on a medication which only relaxes the muscles surrounding the breathing tubes can reduce the effectiveness of medications prescribed to reduce swelling and congestion of the tubes themselves.  Although you feel brief relief from the bronchodilator inhaler, your asthma may actually be worsening with the tubes becoming more swollen and filled with mucus.  This can delay other crucial treatments, such as oral steroid medications. If you need to use a bronchodilator inhaler daily, several times per day, you should discuss this with your provider.  There are probably better treatments that could be used to keep your asthma under control.     HOME CARE . Only take medications as instructed by your medical team. . Complete the entire course of an antibiotic. . Drink plenty of fluids and get plenty of rest. . Avoid close contacts especially the very young and the elderly . Cover your mouth if you cough or cough into your sleeve. . Always remember to wash your hands . A steam or ultrasonic humidifier can help congestion.   GET HELP RIGHT AWAY IF: . You develop worsening fever. . You become short of breath . You cough up blood. . Your symptoms persist after you have completed your treatment plan MAKE SURE YOU   Understand these instructions.  Will watch your condition.  Will get help right away if you are  not doing well or get worse.  Your e-visit answers were reviewed by a board certified advanced clinical practitioner to complete your personal care plan.  Depending on the condition, your plan could have included both over the counter or prescription medications. If there is a problem please reply  once you have received a response from your provider. Your safety is important to [place].  If you have drug allergies check your prescription carefully.    You can use MyChart to ask questions about  today's visit, request a non-urgent call back, or ask for a work or school excuse for 24 hours related to this e-Visit. If it has been greater than 24 hours you will need to follow up with your provider, or enter a new e-Visit to address those concerns. You will get an e-mail in the next two days asking about your experience.  I hope that your e-visit has been valuable and will speed your recovery. Thank you for using e-visits.

## 2018-07-07 ENCOUNTER — Ambulatory Visit (INDEPENDENT_AMBULATORY_CARE_PROVIDER_SITE_OTHER): Payer: BC Managed Care – PPO | Admitting: Family Medicine

## 2018-07-07 ENCOUNTER — Other Ambulatory Visit: Payer: Self-pay

## 2018-07-07 DIAGNOSIS — D179 Benign lipomatous neoplasm, unspecified: Secondary | ICD-10-CM

## 2018-07-07 DIAGNOSIS — F418 Other specified anxiety disorders: Secondary | ICD-10-CM

## 2018-07-07 DIAGNOSIS — R0789 Other chest pain: Secondary | ICD-10-CM

## 2018-07-07 DIAGNOSIS — I10 Essential (primary) hypertension: Secondary | ICD-10-CM

## 2018-07-07 MED ORDER — METOPROLOL SUCCINATE ER 50 MG PO TB24: 50.0000 mg | ORAL_TABLET | Freq: Every day | ORAL | 1 refills | Status: DC

## 2018-07-07 MED ORDER — NITROGLYCERIN 0.4 MG SL SUBL: 0.4000 mg | SUBLINGUAL_TABLET | SUBLINGUAL | 1 refills | Status: DC | PRN

## 2018-07-09 DIAGNOSIS — D179 Benign lipomatous neoplasm, unspecified: Secondary | ICD-10-CM | POA: Insufficient documentation

## 2018-07-09 NOTE — Assessment & Plan Note (Signed)
She has surgery scheduled for next week at [facility] to have it removed.

## 2018-07-09 NOTE — Assessment & Plan Note (Signed)
She acknowledges being under a great deal of stress but declines any further medication changes.

## 2018-07-09 NOTE — Assessment & Plan Note (Signed)
Will add Metoprolol ER 50 mg daily and continue Losartan

## 2018-07-09 NOTE — Assessment & Plan Note (Signed)
Continues to have these intermittently. Once again is offered referral to cardiology for further work up but she declines despite the concern that the cause is not clear. Symptoms are not escalating. She is advised to seek care and notify us if symptoms worsen or she is ready for referral.

## 2018-07-09 NOTE — Progress Notes (Signed)
Virtual Visit via Video Note  I connected with Casey Rowe on 07/07/18 at 10:40 AM EDT by a video enabled telemedicine application and verified that I am speaking with the correct person using two identifiers. Princess Eulas Post CMA was able to get patient set up on Video visit.   Location: Patient: home Provider: home   I discussed the limitations of evaluation and management by telemedicine and the availability of in person appointments. The patient expressed understanding and agreed to proceed.    Subjective:    Patient ID: Casey Rowe, female    DOB: April 01, 1950, 68 y.o.   MRN: [id number]  No chief complaint on file.   HPI Patient is in today for evaluation of her elevated blood pressure.s he notes it has been running high since her last visit. Usually hanging in the 160s but has gone up to the 180s. She admits to being very stressed and notes that affects it. She also has occasional chest pain and most of the time it is when she is under emotional stress. It is not worsening and has been present in the past without associated symptoms of SOB, palpitations, nausea or diaphoresis. She also notes she is planning on surgery next week at [facility] to have a lipoma removed. Denies palp/SOB/HA/congestion/fevers/GI or GU c/o. Taking meds as prescribed  Past Medical History:  Diagnosis Date  . Allergy   . Anxiety   . Asthma   . Broken jaw ([place]) 1980s  . Depression   . Depression with anxiety   . Hypertension   . Preventative health care 03/16/2015  . Sarcoidosis   . Tubal pregnancy 1981    Past Surgical History:  Procedure Laterality Date  . ABDOMINAL HYSTERECTOMY    . CESAREAN SECTION    . ECTOPIC PREGNANCY SURGERY  1981  . FRACTURE SURGERY  1980s   broken jaw    Family History  Problem Relation Age of Onset  . Hypertension Mother   . Dementia Mother   . Diabetes Mother   . Diabetes Father   . Heart attack Father   . Heart disease Father   . Psoriasis Father    . Cancer Sister        pancreatic  . Hepatitis B Brother   . Rashes / Skin problems Son   . Alcohol abuse Sister        substance  . Gout Brother   . Hypertension Brother   . Other Son        bad allergies, aspirin, dermatitis  . Rashes / Skin problems Son   . Cancer Maternal Aunt        breast    Social History   Socioeconomic History  . Marital status: Married    Spouse name: Not on file  . Number of children: Not on file  . Years of education: Not on file  . Highest education level: Not on file  Occupational History  . Not on file  Social Needs  . Financial resource strain: Not on file  . Food insecurity:    Worry: Not on file    Inability: Not on file  . Transportation needs:    Medical: Not on file    Non-medical: Not on file  Tobacco Use  . Smoking status: Never Smoker  . Smokeless tobacco: Never Used  Substance and Sexual Activity  . Alcohol use: Yes  . Drug use: No  . 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 Physical activity:    Days per week: Not on file    Minutes per session: Not on file  . Stress: Not on file  Relationships  . Social connections:    Talks on phone: Not on file    Gets together: Not on file    Attends religious service: Not on file    Active member of club or organization: Not on file    Attends meetings of clubs or organizations: Not on file    Relationship status: Not on file  . Intimate partner violence:    Fear of current or ex partner: Not on file    Emotionally abused: Not on file    Physically abused: Not on file    Forced sexual activity: Not on file  Other Topics Concern  . Not on file  Social History Narrative   Works as a Pharmacist, 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   Married   Grown children- 3          Outpatient Medications Prior to Visit  Medication Sig Dispense Refill  . ALPRAZolam (XANAX) 0.25 MG tablet Take 0.25 mg by mouth daily.      Marland Kitchen azelastine (ASTELIN) 0.1 % nasal spray Place 2 sprays into both nostrils 2 (two) times daily. Use in each nostril as directed 30 mL 12  . buPROPion (WELLBUTRIN XL) 300 MG 24 hr tablet Take 300 mg by mouth daily.    . busPIRone (BUSPAR) 5 MG tablet Take 5 mg by mouth 3 (three) times daily.     . cyclobenzaprine (FLEXERIL) 10 MG tablet Take 1 tablet (10 mg total) by mouth at bedtime as needed for muscle spasms. 20 tablet 0  . doxycycline (VIBRA-TABS) 100 MG tablet Take 1 tablet (100 mg total) by mouth 2 (two) times daily. 20 tablet 0  . EST ESTROGENS-METHYLTEST HS 0.625-1.25 MG per tablet     . fluticasone (FLONASE) 50 MCG/ACT nasal spray Place 1 spray into both nostrils daily. (Patient taking differently: Place 1 spray into both nostrils 2 (two) times daily. ) 16 g 6  . Fluticasone-Salmeterol (ADVAIR) 100-50 MCG/DOSE AEPB INHALE 1 PUFF INTO THE LUNGS 2 (TWO) TIMES DAILY. 60 each 0  . lidocaine (LMX) 4 % cream Apply 1 application topically 3 (three) times daily as needed. 133 g 0  . losartan (COZAAR) 50 MG tablet TAKE 1 TABLET (50 MG TOTAL) BY MOUTH DAILY. 90 tablet 2  . PROAIR HFA 108 (90 Base) MCG/ACT inhaler INHALE 2 PUFFS INTO THE LUNGS EVERY 6 HOURS AS NEEDED FOR WHEEZING 8.5 each 0  . benzonatate (TESSALON) 100 MG capsule TAKE ONE CAPSULE BY MOUTH THREE TIMES A DAY AS NEEDED FOR COUGH 21 capsule 0  . bisoprolol (ZEBETA) 5 MG tablet TAKE 1 TABLET BY MOUTH DAILY 90 tablet 2  . doxycycline (VIBRA-TABS) 100 MG tablet Take 1 tablet (100 mg total) by mouth 2 (two) times daily. 28 tablet 0  . methylPREDNISolone (MEDROL DOSEPAK) 4 MG TBPK tablet Use as directed 21 tablet 0  . predniSONE (DELTASONE) 50 MG tablet Take 1 tablet daily with breakfast 5 tablet 0  . predniSONE (STERAPRED UNI-PAK 21 TAB) 10 MG (21) TBPK tablet Use as directed 21 tablet 0   No facility-administered medications prior to visit.     Allergies  Allergen Reactions  . Demerol Nausea And Vomiting  . Ibuprofen Other (See  Comments)    bruises  . Singulair [Montelukast]  Dry mouth/dehydration  . Augmentin [Amoxicillin-Pot Clavulanate] Diarrhea    Caused Diarrhea    Review of Systems  Constitutional: Positive for malaise/fatigue. Negative for fever.  HENT: Negative for congestion.   Eyes: Negative for blurred vision.  Respiratory: Negative for shortness of breath.   Cardiovascular: Positive for chest pain. Negative for palpitations and leg swelling.  Gastrointestinal: Negative for abdominal pain, blood in stool and nausea.  Genitourinary: Negative for dysuria and frequency.  Musculoskeletal: Negative for falls.  Skin: Negative for rash.  Neurological: Negative for dizziness, loss of consciousness and headaches.  Endo/Heme/Allergies: Negative for environmental allergies.  Psychiatric/Behavioral: Positive for depression. The patient is nervous/anxious.        Objective:    Physical Exam Constitutional:      Appearance: Normal appearance.  HENT:     Head: Normocephalic and atraumatic.     Nose: Nose normal.  Pulmonary:     Effort: Pulmonary effort is normal.  Neurological:     Mental Status: She is alert and oriented to person, place, and time.  Psychiatric:        Mood and Affect: Mood normal.        Behavior: Behavior normal.     LMP 02/08/1997  Wt Readings from Last 3 Encounters:  03/24/17 134 lb 12.8 oz (61.1 kg)  01/01/17 133 lb (60.3 kg)  11/21/16 133 lb (60.3 kg)    Diabetic Foot Exam - Simple   No data filed     Lab Results  Component Value Date   WBC 5.2 03/24/2017   HGB 14.2 03/24/2017   HCT 42.4 03/24/2017   PLT 197.0 03/24/2017   GLUCOSE 84 03/24/2017   CHOL 136 03/24/2017   TRIG 98.0 03/24/2017   HDL 37.20 (L) 03/24/2017   LDLCALC 79 03/24/2017   ALT 15 03/24/2017   AST 16 03/24/2017   NA 139 03/24/2017   K 4.0 03/24/2017   CL 104 03/24/2017   CREATININE 0.77 03/24/2017   BUN 12 03/24/2017   CO2 31 03/24/2017   TSH 1.06 03/24/2017    Lab Results   Component Value Date   TSH 1.06 03/24/2017   Lab Results  Component Value Date   WBC 5.2 03/24/2017   HGB 14.2 03/24/2017   HCT 42.4 03/24/2017   MCV 101.2 (H) 03/24/2017   PLT 197.0 03/24/2017   Lab Results  Component Value Date   NA 139 03/24/2017   K 4.0 03/24/2017   CO2 31 03/24/2017   GLUCOSE 84 03/24/2017   BUN 12 03/24/2017   CREATININE 0.77 03/24/2017   BILITOT 0.6 03/24/2017   ALKPHOS 31 (L) 03/24/2017   AST 16 03/24/2017   ALT 15 03/24/2017   PROT 6.6 03/24/2017   ALBUMIN 4.0 03/24/2017   CALCIUM 9.3 03/24/2017   GFR 80.07 03/24/2017   Lab Results  Component Value Date   CHOL 136 03/24/2017   Lab Results  Component Value Date   HDL 37.20 (L) 03/24/2017   Lab Results  Component Value Date   LDLCALC 79 03/24/2017   Lab Results  Component Value Date   TRIG 98.0 03/24/2017   Lab Results  Component Value Date   CHOLHDL 4 03/24/2017   No results found for: HGBA1C     Assessment & Plan:   Problem List Items Addressed This Visit    Depression with anxiety    She acknowledges being under a great deal of stress but declines any further medication changes.       Hypertension  Will add Metoprolol ER 50 mg daily and continue Losartan       Relevant Medications   nitroGLYCERIN (NITROSTAT) 0.4 MG SL tablet   metoprolol succinate (TOPROL-XL) 50 MG 24 hr tablet   Atypical chest pain    Continues to have these intermittently. Once again is offered referral to cardiology for further work up but she declines despite the concern that the cause is not clear. Symptoms are not escalating. She is advised to seek care and notify us if symptoms worsen or she is ready for referral.       Lipoma    She has surgery scheduled for next week at [facility] to have it removed.          I have discontinued Tyshell L. Tomerlin's methylPREDNISolone, predniSONE, bisoprolol, benzonatate, and predniSONE. I am also having her start on nitroGLYCERIN and metoprolol succinate.  Additionally, I am having her maintain her ALPRAZolam, busPIRone, Est Estrogens-Methyltest HS, fluticasone, buPROPion, azelastine, lidocaine, cyclobenzaprine, ProAir HFA, losartan, Fluticasone-Salmeterol, and doxycycline.  Meds ordered this encounter  Medications  . nitroGLYCERIN (NITROSTAT) 0.4 MG SL tablet    Sig: Place 1 tablet (0.4 mg total) under the tongue every 5 (five) minutes as needed for chest pain.    Dispense:  25 tablet    Refill:  1  . metoprolol succinate (TOPROL-XL) 50 MG 24 hr tablet    Sig: Take 1 tablet (50 mg total) by mouth daily. Take with or immediately following a meal.    Dispense:  30 tablet    Refill:  1    I discussed the assessment and treatment plan with the patient. The patient was provided an opportunity to ask questions and all were answered. The patient agreed with the plan and demonstrated an understanding of the instructions.   The patient was advised to call back or seek an in-person evaluation if the symptoms worsen or if the condition fails to improve as anticipated.  I provided 25 minutes of non-face-to-face time during this encounter.   Penni Homans, MD

## 2018-07-28 ENCOUNTER — Encounter: Payer: Self-pay | Admitting: Medical

## 2018-07-28 ENCOUNTER — Other Ambulatory Visit: Payer: Self-pay

## 2018-07-28 ENCOUNTER — Telehealth: Payer: BC Managed Care – PPO | Admitting: Family

## 2018-07-28 ENCOUNTER — Ambulatory Visit (INDEPENDENT_AMBULATORY_CARE_PROVIDER_SITE_OTHER): Payer: BC Managed Care – PPO | Admitting: Medical

## 2018-07-28 VITALS — Temp 96.5°F

## 2018-07-28 DIAGNOSIS — J208 Acute bronchitis due to other specified organisms: Secondary | ICD-10-CM

## 2018-07-28 DIAGNOSIS — B9689 Other specified bacterial agents as the cause of diseases classified elsewhere: Secondary | ICD-10-CM | POA: Diagnosis not present

## 2018-07-28 DIAGNOSIS — R05 Cough: Secondary | ICD-10-CM

## 2018-07-28 DIAGNOSIS — R059 Cough, unspecified: Secondary | ICD-10-CM

## 2018-07-28 DIAGNOSIS — R062 Wheezing: Secondary | ICD-10-CM

## 2018-07-28 MED ORDER — HYDROCODONE-HOMATROPINE 5-1.5 MG/5ML PO SYRP: 5.0000 mL | ORAL_SOLUTION | Freq: Four times a day (QID) | ORAL | 0 refills | Status: DC | PRN

## 2018-07-28 MED ORDER — PREDNISONE 10 MG PO TABS: ORAL_TABLET | ORAL | 0 refills | Status: DC

## 2018-07-28 MED ORDER — AZITHROMYCIN 250 MG PO TABS
ORAL_TABLET | ORAL | 0 refills | Status: DC
Start: 2018-07-28 — End: 2019-02-11

## 2018-07-28 NOTE — Progress Notes (Signed)
   Subjective:    Patient ID: Casey Rowe, female    DOB: 1951-02-01, 68 y.o.   MRN: [id number]  HPI  Virtual Visit via Video Note  I connected with Casey Rowe on 07/28/18 at  1:40 PM EDT by a video enabled telemedicine application and verified that I am speaking with the correct person using two identifiers.  Location: Patient: home Provider: home   I discussed the limitations of evaluation and management by telemedicine and the availability of in person appointments. The patient expressed understanding and agreed to proceed.  History of Present Illness:  Pt in states recent constant cough, wheezing, thick green mucus and cough worse at night. Keeping her husband up at night. Pt does not have fever. Pt does not have any bodyaches.  Pt states about one month ago she has uri type symptoms.  Pt has been taking advair 1 inh twice day and seems to help some. Pt using albuterol once a day at most. Still wheezing some.  Pt is non smoker.   Pt not diabetic.  Pt staying at home for most part. Wears mask and gloves. Surgery 3 weeks ago and family visited. Pt had covid test before operation.       Observations/Objective:  General-no acute distress, pleasant, oriented. Lungs- on inspection lungs appear unlabored. Neck- no tracheal deviation or jvd on inspection. Neuro- gross motor function appears intact.  Assessment and Plan: You appear to have bronchitis. Rest hydrate and tylenol for fever. I am prescribing cough medicine hycodan, and azithromycin antibiotic.   For wheezing, continue advair inhaler and use albuterol if needed. I do want you to take 6 day taper prednisone. In light of covid pandemic want to see if symptoms clear rapidly.  If symptoms/signs don't clear rapidly by wed next week my chart your pcp for update or contact princess MA of Dr. Charlett Blake. They may arrange for you to get covid test. Stay at home until update.   If signs/symptoms worsen or change then in  that event ED evaluation.   Follow up in 7-10 days or as needed  .Mackie Pai, PA-C  Follow Up Instructions:    I discussed the assessment and treatment plan with the patient. The patient was provided an opportunity to ask questions and all were answered. The patient agreed with the plan and demonstrated an understanding of the instructions.   The patient was advised to call back or seek an in-person evaluation if the symptoms worsen or if the condition fails to improve as anticipated.  I provided 15 minutes of non-face-to-face time during this encounter.   Mackie Pai, PA-C    Review of Systems  Constitutional: Negative for chills, fatigue and unexpected weight change.  HENT: Positive for congestion. Negative for sinus pressure, sinus pain and sore throat.   Respiratory: Positive for cough and wheezing. Negative for chest tightness and shortness of breath.   Cardiovascular: Negative for chest pain and palpitations.  Gastrointestinal: Negative for abdominal pain.  Musculoskeletal: Negative for back pain and myalgias.  Skin: Negative for rash.  Neurological: Negative for dizziness, syncope, weakness and headaches.  Hematological: Negative for adenopathy. Does not bruise/bleed easily.  Psychiatric/Behavioral: Negative for behavioral problems and confusion.       Objective:   Physical Exam        Assessment & Plan:

## 2018-07-28 NOTE — Progress Notes (Signed)
Based on what you shared with me, I feel your condition warrants further evaluation and I recommend that you be seen for a face to face office visit. You may call your primary care or consider an option below.  NOTE: If you entered your credit card information for this eVisit, you will not be charged. You may see a "hold" on your card for the $35 but that hold will drop off and you will not have a charge processed.  If you are having a true medical emergency please call 911.     For an urgent face to face visit, [facility] has five urgent care centers for your convenience:    [email or web address] to reserve your spot online an avoid wait times  [facility] [address], [address] [place], [place] [address] Modified hours of operation: Monday-Friday, 12 PM to 6 PM  Closed Saturday & Sunday  *Across the street from [place] (New Address!) [address], [place], [place] [address] *Just off [address], across the road from [place] hours of operation: Monday-Friday, 12 PM to 6 PM  Closed Saturday & Sunday   The following sites will take your insurance:  . [facility]    [phone]                  Get Driving Directions  [address], [place] [address] . 10 am to 8 pm Monday-Friday . 12 pm to 8 pm Saturday-Sunday   . [facility] at [place]                  Get Driving Directions  [address], [place], [place] [address] . 8 am to 8 pm Monday-Friday . 9 am to 6 pm Saturday . 11 am to 6 pm Sunday   . [facility] at [place]                  Get Driving Directions   [address] [place], [place] [address] . 8 am to 8 pm Monday-Friday . 8 am to 4 pm Saturday-Sunday    . [facility] at [place]  [address] [place], [place] [address]  . Monday-Friday, 12 PM to 6 PM    Your e-visit answers were reviewed by a board certified advanced clinical practitioner to complete your personal care plan.  Thank you for using e-Visits.

## 2018-07-28 NOTE — Patient Instructions (Signed)
You appear to have bronchitis. Rest hydrate and tylenol for fever. I am prescribing cough medicine hycodan, and azithromycin antibiotic.   For wheezing, continue advair inhaler and use albuterol if needed. I do want you to take 6 day taper prednisone. In light of covid pandemic want to see if symptoms clear rapidly.  If symptoms/signs don't clear rapidly by wed next week my chart your pcp for update or contact princess MA of Dr. Charlett Blake. They may arrange for you to get covid test. Stay at home until update.   If signs/symptoms worsen or change then in that event ED evaluation.   Follow up in 7-10 days or as needed

## 2018-08-09 ENCOUNTER — Other Ambulatory Visit: Payer: Self-pay | Admitting: Family Medicine

## 2018-08-28 ENCOUNTER — Other Ambulatory Visit: Payer: Self-pay | Admitting: Family Medicine

## 2018-08-28 DIAGNOSIS — Z760 Encounter for issue of repeat prescription: Secondary | ICD-10-CM

## 2018-09-03 ENCOUNTER — Other Ambulatory Visit: Payer: Self-pay | Admitting: Family Medicine

## 2018-09-04 ENCOUNTER — Other Ambulatory Visit: Payer: Self-pay | Admitting: Family Medicine

## 2018-09-13 ENCOUNTER — Encounter: Payer: Self-pay | Admitting: Family Medicine

## 2018-09-13 NOTE — Telephone Encounter (Signed)
Patient had an appointment in May.  No future labs was placed.  Did you want any?  Her last labs were done way back in February 2019.

## 2018-09-29 ENCOUNTER — Other Ambulatory Visit: Payer: Self-pay | Admitting: Family Medicine

## 2018-09-29 DIAGNOSIS — Z0000 Encounter for general adult medical examination without abnormal findings: Secondary | ICD-10-CM

## 2018-10-13 ENCOUNTER — Ambulatory Visit: Payer: BC Managed Care – PPO | Admitting: Medical

## 2018-10-13 ENCOUNTER — Ambulatory Visit (INDEPENDENT_AMBULATORY_CARE_PROVIDER_SITE_OTHER): Payer: BC Managed Care – PPO | Admitting: Medical

## 2018-10-13 ENCOUNTER — Encounter: Payer: Self-pay | Admitting: Medical

## 2018-10-13 ENCOUNTER — Telehealth: Payer: Self-pay | Admitting: Family Medicine

## 2018-10-13 ENCOUNTER — Other Ambulatory Visit: Payer: Self-pay

## 2018-10-13 VITALS — Wt 135.0 lb

## 2018-10-13 DIAGNOSIS — R062 Wheezing: Secondary | ICD-10-CM | POA: Diagnosis not present

## 2018-10-13 DIAGNOSIS — R05 Cough: Secondary | ICD-10-CM

## 2018-10-13 DIAGNOSIS — I10 Essential (primary) hypertension: Secondary | ICD-10-CM

## 2018-10-13 DIAGNOSIS — R002 Palpitations: Secondary | ICD-10-CM

## 2018-10-13 DIAGNOSIS — R059 Cough, unspecified: Secondary | ICD-10-CM

## 2018-10-13 MED ORDER — AZITHROMYCIN 250 MG PO TABS: ORAL_TABLET | ORAL | 0 refills | Status: DC

## 2018-10-13 MED ORDER — PREDNISONE 10 MG PO TABS: ORAL_TABLET | ORAL | 0 refills | Status: DC

## 2018-10-13 MED ORDER — BENZONATATE 100 MG PO CAPS: 100.0000 mg | ORAL_CAPSULE | Freq: Three times a day (TID) | ORAL | 0 refills | Status: DC | PRN

## 2018-10-13 NOTE — Patient Instructions (Addendum)
  Patient has some symptoms of bronchitis with wheezing.  On review appears that she has history of asthma and she does report that this is of the year when her allergies flare and she can get airway tightening.  Was not able to evaluate patient in the office today due to COVID restrictions/precautions.  Start her signs and symptom description explained best to start with prednisone six-day taper dose tonight.  Explained get first days tablets then this afternoon and take second day tabs tomorrow morning.  Then follow taper regimen as explained.  Start a azithromycin antibiotic today as well.  Patient does not have O2 sat monitor at home but asked that she get 1 and check her oxygen saturation daily.  Get baseline reading today and if her O2 sat drops significantly then be seen at the emergency department.  Explained definitely be seen in the emergency department for any reading 90% or less.  Did also explained even without very low reading if she is severely symptomatic to be seen in the ED.  Patient expressed understanding.  If she does improve some but still symptomatic that I want her to still go through the [facility].  If she gets worse rather than better with above treatment then advise emergency department evaluation.  Explained that in that event may continue COVID test, labs and chest x-ray.  Follow-up in 7 days or as needed.

## 2018-10-13 NOTE — Addendum Note (Signed)
Addended by: Magdalene Molly A on: 10/13/2018 02:03 PM   Modules accepted: Orders

## 2018-10-13 NOTE — Progress Notes (Signed)
Subjective:    Patient ID: Casey Rowe, female    DOB: 04/24/1950, 68 y.o.   MRN: [id number]  HPI   Virtual Visit via Video Note  I connected with Casey Rowe on 10/13/18 at  3:20 PM EDT by a video enabled telemedicine application and verified that I am speaking with the correct person using two identifiers.  Location: Patient: home Provider: office   I discussed the limitations of evaluation and management by telemedicine and the availability of in person appointments. The patient expressed understanding and agreed to proceed.  History of Present Illness:   Pt states she has been short of breath gradually over past 2 weeks she has been getting progressivley worse sob or wheezing. Pt is on advair. She is not diabetic.   Pt has bring up some yellow mucus with mucinex. So she stopped. No chills or sweats.  She states hears a lot wheezing and rough breath sounds at night.  She states that for about a week now she gets extremely short of breath at night and feels like she is not able to sleep.  No leg pains and legs not swelling.  Her oxygen today was 96% when she came into the office.  Unfortunately she was not able to be seen in our office due to the COVID restrictions       Observations/Objective: General-no acute distress, pleasant, oriented. Lungs- on inspection lungs appear unlabored. Neck- no tracheal deviation or jvd on inspection. Neuro- gross motor function appears intact.   Assessment and Plan: Patient has some symptoms of bronchitis with wheezing.  On review appears that she has history of asthma and she does report that this is of the year when her allergies flare and she can get airway tightening.  Was not able to evaluate patient in the office today due to COVID restrictions/precautions.  Start her signs and symptom description explained best to start with prednisone six-day taper dose tonight.  Explained get first days tablets then this afternoon  and take second day tabs tomorrow morning.  Then follow taper regimen as explained.  Start a azithromycin antibiotic today as well.  Patient does not have O2 sat monitor at home but asked that she get 1 and check her oxygen saturation daily.  Get baseline reading today and if her O2 sat drops significantly then be seen at the emergency department.  Explained definitely be seen in the emergency department for any reading 90% or less.  Did also explained even without very low reading if she is severely symptomatic to be seen in the ED.  Patient expressed understanding.  If she does improve some but still symptomatic that I want her to still go through the [place] [facility] at [facility].  If she gets worse rather than better with above treatment then advise emergency department evaluation.  Explained that in that event may continue COVID test, labs and chest x-ray.  Follow-up in 7 days or as needed.  Follow Up Instructions:    I discussed the assessment and treatment plan with the patient. The patient was provided an opportunity to ask questions and all were answered. The patient agreed with the plan and demonstrated an understanding of the instructions.   The patient was advised to call back or seek an in-person evaluation if the symptoms worsen or if the condition fails to improve as anticipated.  I provided 25 minutes of non-face-to-face time during this encounter.   Mackie Pai, PA-C    Review  of Systems  Constitutional: Negative for chills, fatigue and fever.  HENT: Negative for congestion, postnasal drip, sinus pressure and sinus pain.   Respiratory: Positive for cough, shortness of breath and wheezing.        Symptoms severe at night.  Cardiovascular: Negative for chest pain and palpitations.  Gastrointestinal: Negative for abdominal pain.  Musculoskeletal: Negative for back pain and myalgias.  Skin: Negative for rash.  Neurological: Negative for dizziness  and headaches.  Hematological: Negative for adenopathy. Does not bruise/bleed easily.  Psychiatric/Behavioral: Negative for behavioral problems and confusion.    Past Medical History:  Diagnosis Date  . Allergy   . Anxiety   . Asthma   . Broken jaw ([place]) 1980s  . Depression   . Depression with anxiety   . Hypertension   . Preventative health care 03/16/2015  . Sarcoidosis   . Tubal pregnancy 1981     Social History   Socioeconomic History  . Marital status: Married    Spouse name: Not on file  . Number of children: Not on file  . Years of education: Not on file  . Highest education level: Not on file  Occupational History  . Not on file  Social Needs  . Financial resource strain: Not on file  . Food insecurity    Worry: Not on file    Inability: Not on file  . Transportation needs    Medical: Not on file    Non-medical: Not on file  Tobacco Use  . Smoking status: Never Smoker  . Smokeless tobacco: Never Used  Substance and Sexual Activity  . Alcohol use: Yes  . Drug use: No  . 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 Physical activity    Days per week: Not on file    Minutes per session: Not on file  . Stress: Not on file  Relationships  . Social Herbalist on phone: Not on file    Gets together: Not on file    Attends religious service: Not on file    Active member of club or organization: Not on file    Attends meetings of clubs or organizations: Not on file    Relationship status: Not on file  . Intimate partner violence    Fear of current or ex partner: Not on file    Emotionally abused: Not on file    Physically abused: Not on file    Forced sexual activity: Not on file  Other Topics Concern  . 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 ABDOMINAL HYSTERECTOMY    . CESAREAN SECTION    . ECTOPIC PREGNANCY SURGERY  1981  . FRACTURE SURGERY  1980s   broken jaw    Family History  Problem Relation Age of Onset  . Hypertension Mother   . Dementia Mother   . Diabetes Mother   . Diabetes Father   . Heart attack Father   . Heart disease Father   . Psoriasis Father   . Cancer Sister        pancreatic  . Hepatitis B Brother   . Rashes / Skin problems Son   . Alcohol abuse Sister  substance  . Gout Brother   . Hypertension Brother   . Other Son        bad allergies, aspirin, dermatitis  . Rashes / Skin problems Son   . Cancer Maternal Aunt        breast    Allergies  Allergen Reactions  . Demerol Nausea And Vomiting  . Ibuprofen Other (See Comments)    bruises  . Singulair [Montelukast]     Dry mouth/dehydration  . Augmentin [Amoxicillin-Pot Clavulanate] Diarrhea    Caused Diarrhea    Current Outpatient Medications on File Prior to Visit  Medication Sig Dispense Refill  . albuterol (VENTOLIN HFA) 108 (90 Base) MCG/ACT inhaler INHALE 2 PUFFS INTO THE LUNGS EVERY 6 HOURS AS NEEDED FOR WHEEZING 8.5 g 0  . ALPRAZolam (XANAX) 0.25 MG tablet Take 0.25 mg by mouth daily.     Marland Kitchen azelastine (ASTELIN) 0.1 % nasal spray Place 2 sprays into both nostrils 2 (two) times daily. Use in each nostril as directed 30 mL 12  . azithromycin (ZITHROMAX) 250 MG tablet 2 tab po day 1. 1 tab po x 4 days 6 tablet 0  . bisoprolol (ZEBETA) 5 MG tablet TAKE 1 TABLET BY MOUTH DAILY 90 tablet 2  . buPROPion (WELLBUTRIN XL) 300 MG 24 hr tablet Take 300 mg by mouth daily.    . busPIRone (BUSPAR) 5 MG tablet Take 5 mg by mouth 3 (three) times daily.     . cyclobenzaprine (FLEXERIL) 10 MG tablet Take 1 tablet (10 mg total) by mouth at bedtime as needed for muscle spasms. 20 tablet 0  . doxycycline (VIBRA-TABS) 100 MG tablet Take 1 tablet (100 mg total) by mouth 2 (two) times daily. 20 tablet 0  . EST  ESTROGENS-METHYLTEST HS 0.625-1.25 MG per tablet     . fluticasone (FLONASE) 50 MCG/ACT nasal spray Place 1 spray into both nostrils daily. (Patient taking differently: Place 1 spray into both nostrils 2 (two) times daily. ) 16 g 6  . Fluticasone-Salmeterol (ADVAIR) 100-50 MCG/DOSE AEPB INHALE 1 PUFF INTO THE LUNGS 2 (TWO) TIMES DAILY. 60 each 0  . HYDROcodone-homatropine (HYCODAN) 5-1.5 MG/5ML syrup Take 5 mLs by mouth every 6 (six) hours as needed. 100 mL 0  . lidocaine (LMX) 4 % cream Apply 1 application topically 3 (three) times daily as needed. 133 g 0  . losartan (COZAAR) 50 MG tablet TAKE 1 TABLET (50 MG TOTAL) BY MOUTH DAILY. 90 tablet 2  . metoprolol succinate (TOPROL-XL) 50 MG 24 hr tablet TAKE ONE TABLET BY MOUTH DAILY WITH OR IMMEDIATETLY FOLLOWING A MEAL 30 tablet 0  . nitroGLYCERIN (NITROSTAT) 0.4 MG SL tablet Place 1 tablet (0.4 mg total) under the tongue every 5 (five) minutes as needed for chest pain. 25 tablet 1   No current facility-administered medications on file prior to visit.     Wt 135 lb (61.2 kg)   LMP 02/08/1997   SpO2 96%   BMI 23.17 kg/m       Objective:   Physical Exam        Assessment & Plan:

## 2018-10-13 NOTE — Telephone Encounter (Signed)
Patient needing lab orders placed. Please see last mychart message.

## 2018-10-13 NOTE — Telephone Encounter (Signed)
Lab orders placed.  

## 2018-10-17 ENCOUNTER — Encounter: Payer: Self-pay | Admitting: Medical

## 2018-10-22 ENCOUNTER — Other Ambulatory Visit: Payer: Self-pay | Admitting: Family Medicine

## 2018-10-29 ENCOUNTER — Other Ambulatory Visit: Payer: Self-pay | Admitting: Family Medicine

## 2018-11-23 ENCOUNTER — Other Ambulatory Visit: Payer: Self-pay | Admitting: Family Medicine

## 2018-12-19 ENCOUNTER — Other Ambulatory Visit (HOSPITAL_BASED_OUTPATIENT_CLINIC_OR_DEPARTMENT_OTHER): Payer: Self-pay | Admitting: Family Medicine

## 2018-12-19 DIAGNOSIS — Z1231 Encounter for screening mammogram for malignant neoplasm of breast: Secondary | ICD-10-CM

## 2018-12-20 ENCOUNTER — Other Ambulatory Visit: Payer: Self-pay | Admitting: Family Medicine

## 2019-01-08 NOTE — Telephone Encounter (Signed)
11/11 mychart message came back as unread and appt has not been scheduled at this time. Letter mailed to pt.

## 2019-01-16 ENCOUNTER — Other Ambulatory Visit: Payer: Self-pay | Admitting: Family Medicine

## 2019-01-19 ENCOUNTER — Other Ambulatory Visit: Payer: Self-pay

## 2019-01-19 ENCOUNTER — Ambulatory Visit (HOSPITAL_BASED_OUTPATIENT_CLINIC_OR_DEPARTMENT_OTHER)
Admission: RE | Admit: 2019-01-19 | Discharge: 2019-01-19 | Disposition: A | Discharge status: Home / Self Care | Payer: BC Managed Care – PPO | Source: Ambulatory Visit | Attending: Family Medicine | Admitting: Family Medicine

## 2019-01-19 DIAGNOSIS — Z1231 Encounter for screening mammogram for malignant neoplasm of breast: Secondary | ICD-10-CM | POA: Insufficient documentation

## 2019-02-08 ENCOUNTER — Ambulatory Visit (INDEPENDENT_AMBULATORY_CARE_PROVIDER_SITE_OTHER): Payer: BC Managed Care – PPO | Admitting: Family Medicine

## 2019-02-08 ENCOUNTER — Other Ambulatory Visit: Payer: Self-pay

## 2019-02-08 DIAGNOSIS — Z0000 Encounter for general adult medical examination without abnormal findings: Secondary | ICD-10-CM

## 2019-02-08 DIAGNOSIS — Z760 Encounter for issue of repeat prescription: Secondary | ICD-10-CM

## 2019-02-08 DIAGNOSIS — J45909 Unspecified asthma, uncomplicated: Secondary | ICD-10-CM | POA: Diagnosis not present

## 2019-02-08 DIAGNOSIS — J45901 Unspecified asthma with (acute) exacerbation: Secondary | ICD-10-CM

## 2019-02-08 DIAGNOSIS — K219 Gastro-esophageal reflux disease without esophagitis: Secondary | ICD-10-CM

## 2019-02-08 DIAGNOSIS — I10 Essential (primary) hypertension: Secondary | ICD-10-CM

## 2019-02-08 MED ORDER — ALBUTEROL SULFATE HFA 108 (90 BASE) MCG/ACT IN AERS: INHALATION_SPRAY | RESPIRATORY_TRACT | 5 refills | Status: DC

## 2019-02-08 MED ORDER — CEFDINIR 300 MG PO CAPS: 300.0000 mg | ORAL_CAPSULE | Freq: Two times a day (BID) | ORAL | 0 refills | Status: AC

## 2019-02-08 MED ORDER — FLUCONAZOLE 150 MG PO TABS: 150.0000 mg | ORAL_TABLET | ORAL | 1 refills | Status: DC

## 2019-02-08 MED ORDER — FAMOTIDINE 40 MG PO TABS: 40.0000 mg | ORAL_TABLET | Freq: Every day | ORAL | 3 refills | Status: DC

## 2019-02-08 MED ORDER — FLUTICASONE-SALMETEROL 100-50 MCG/DOSE IN AEPB: INHALATION_SPRAY | RESPIRATORY_TRACT | 5 refills | Status: DC

## 2019-02-08 MED ORDER — BENZONATATE 100 MG PO CAPS: 100.0000 mg | ORAL_CAPSULE | Freq: Three times a day (TID) | ORAL | 1 refills | Status: DC | PRN

## 2019-02-11 DIAGNOSIS — K219 Gastro-esophageal reflux disease without esophagitis: Secondary | ICD-10-CM | POA: Insufficient documentation

## 2019-02-11 NOTE — Assessment & Plan Note (Signed)
Given refill on Adviar and Albuterol

## 2019-02-11 NOTE — Assessment & Plan Note (Signed)
Tessalon perles, increase Albuterol use prn and started on Cefdinir and mucinex. Given refill on Advair

## 2019-02-11 NOTE — Assessment & Plan Note (Signed)
Likely contributing to respiratory symptoms at night. Avoid offending foods, start probiotics. Do not eat large meals in late evening and consider raising head of bed. Start on Famotidine 40 mg qhs.

## 2019-02-11 NOTE — Progress Notes (Signed)
Virtual Visit via Video Note  I connected with [name] on 02/08/19 at 11:20 AM EST by a video enabled telemedicine application and verified that I am speaking with the correct person using two identifiers.  Location: Patient: home Provider: home   I discussed the limitations of evaluation and management by telemedicine and the availability of in person appointments. The patient expressed understanding and agreed to proceed. Magdalene Molly, CMA was able to get the patient set up on a visit, video   Subjective:    Patient ID: Casey Rowe, female    DOB: [birth year], 69 y.o.   MRN: [id number]  No chief complaint on file.   HPI Patient is in today for follow upon asthma and worsening cough. She has been struggling with a cough and it has worsened. It is most notable at night and it wakes her. She denies any fevers or chills. She notes wheezing and sob at times. Denies CP/palp//HA/fevers/GI or GU c/o. Taking meds as prescribed  Past Medical History:  Diagnosis Date  . Allergy   . Anxiety   . Asthma   . Broken jaw ([place]) 1980s  . Depression   . Depression with anxiety   . Hypertension   . Preventative health care 03/16/2015  . Sarcoidosis   . Tubal pregnancy 1981    Past Surgical History:  Procedure Laterality Date  . ABDOMINAL HYSTERECTOMY    . CESAREAN SECTION    . ECTOPIC PREGNANCY SURGERY  1981  . FRACTURE SURGERY  1980s   broken jaw    Family History  Problem Relation Age of Onset  . Hypertension Mother   . Dementia Mother   . Diabetes Mother   . Diabetes Father   . Heart attack Father   . Heart disease Father   . Psoriasis Father   . Cancer Sister        pancreatic  . Hepatitis B Brother   . Rashes / Skin problems Son   . Alcohol abuse Sister        substance  . Gout Brother   . Hypertension Brother   . Other Son        bad allergies, aspirin, dermatitis  . Rashes / Skin problems Son   . Cancer Maternal Aunt        breast    Social History     Socioeconomic History  . Marital status: Married    Spouse name: Not on file  . Number of children: Not on file  . Years of education: Not on file  . Highest education level: Not on file  Occupational History  . Not on file  Tobacco Use  . Smoking status: Never Smoker  . Smokeless tobacco: Never Used  Substance and Sexual Activity  . Alcohol use: Yes  . Drug use: No  . 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 Difficulty of Paying Living Expenses: Not on file  Food Insecurity:   . Worried About Charity fundraiser in the Last Year: Not on file  . Ran Out of Food in the Last Year: Not on file  Transportation Needs:   .  Lack of Transportation (Medical): Not on file  . Lack of Transportation (Non-Medical): Not on file  Physical Activity:   . Days of Exercise per Week: Not on file  . Minutes of Exercise per Session: Not on file  Stress:   . Feeling of Stress : Not on file  Social Connections:   . Frequency of Communication with Friends and Family: Not on file  . Frequency of Social Gatherings with Friends and Family: Not on file  . Attends Religious Services: Not on file  . Active Member of Clubs or Organizations: Not on file  . Attends Archivist Meetings: Not on file  . Marital Status: Not on file  Intimate Partner Violence:   . Fear of Current or Ex-Partner: Not on file  . Emotionally Abused: Not on file  . Physically Abused: Not on file  . Sexually Abused: Not on file    Outpatient Medications Prior to Visit  Medication Sig Dispense Refill  . ALPRAZolam (XANAX) 0.25 MG tablet Take 0.25 mg by mouth daily.     Marland Kitchen azelastine (ASTELIN) 0.1 % nasal spray Place 2 sprays into both  nostrils 2 (two) times daily. Use in each nostril as directed 30 mL 12  . bisoprolol (ZEBETA) 5 MG tablet TAKE 1 TABLET BY MOUTH DAILY 90 tablet 2  . buPROPion (WELLBUTRIN XL) 300 MG 24 hr tablet Take 300 mg by mouth daily.    . busPIRone (BUSPAR) 5 MG tablet Take 5 mg by mouth 3 (three) times daily.     . cyclobenzaprine (FLEXERIL) 10 MG tablet Take 1 tablet (10 mg total) by mouth at bedtime as needed for muscle spasms. 20 tablet 0  . EST ESTROGENS-METHYLTEST HS 0.625-1.25 MG per tablet     . fluticasone (FLONASE) 50 MCG/ACT nasal spray Place 1 spray into both nostrils daily. (Patient taking differently: Place 1 spray into both nostrils 2 (two) times daily. ) 16 g 6  . HYDROcodone-homatropine (HYCODAN) 5-1.5 MG/5ML syrup Take 5 mLs by mouth every 6 (six) hours as needed. 100 mL 0  . lidocaine (LMX) 4 % cream Apply 1 application topically 3 (three) times daily as needed. 133 g 0  . losartan (COZAAR) 50 MG tablet TAKE ONE TABLET BY MOUTH DAILY 90 tablet 1  . metoprolol succinate (TOPROL-XL) 50 MG 24 hr tablet TAKE 1 TABLET BY MOUTH DAILY WITH A MEAL OR IMMEDIATELY FOLLOWING A MEAL 30 tablet 0  . nitroGLYCERIN (NITROSTAT) 0.4 MG SL tablet Place 1 tablet (0.4 mg total) under the tongue every 5 (five) minutes as needed for chest pain. 25 tablet 1  . predniSONE (DELTASONE) 10 MG tablet Taper over 6 days 21 tablet 0  . albuterol (VENTOLIN HFA) 108 (90 Base) MCG/ACT inhaler INHALE 2 PUFFS INTO THE LUNGS EVERY 6 HOURS AS NEEDED FOR WHEEZING 8.5 g 0  . azithromycin (ZITHROMAX) 250 MG tablet 2 tab po day 1. 1 tab po x 4 days 6 tablet 0  . azithromycin (ZITHROMAX) 250 MG tablet Take 2 tablets by mouth on day 1, followed by 1 tablet by mouth daily for 4 days. 6 tablet 0  . benzonatate (TESSALON) 100 MG capsule Take 1 capsule (100 mg total) by mouth 3 (three) times daily as needed for cough. 30 capsule 0  . doxycycline (VIBRA-TABS) 100 MG tablet Take 1 tablet (100 mg total) by mouth 2 (two) times daily. 20  tablet 0  . Fluticasone-Salmeterol (ADVAIR) 100-50 MCG/DOSE AEPB INHALE 1 PUFF INTO THE LUNGS 2 (TWO) TIMES DAILY.  60 each 0   No facility-administered medications prior to visit.    Allergies  Allergen Reactions  . Demerol Nausea And Vomiting  . Ibuprofen Other (See Comments)    bruises  . Singulair [Montelukast]     Dry mouth/dehydration  . Augmentin [Amoxicillin-Pot Clavulanate] Diarrhea    Caused Diarrhea    Review of Systems  Constitutional: Positive for malaise/fatigue. Negative for fever.  HENT: Positive for congestion.   Eyes: Negative for blurred vision.  Respiratory: Positive for cough, sputum production and shortness of breath.   Cardiovascular: Negative for chest pain, palpitations and leg swelling.  Gastrointestinal: Positive for heartburn. Negative for abdominal pain, blood in stool and nausea.  Genitourinary: Negative for dysuria and frequency.  Musculoskeletal: Negative for falls.  Skin: Negative for rash.  Neurological: Negative for dizziness, loss of consciousness and headaches.  Endo/Heme/Allergies: Negative for environmental allergies.  Psychiatric/Behavioral: Negative for depression. The patient is not nervous/anxious.        Objective:    Physical Exam  BP 123/70 (BP Location: Left Arm, Patient Position: Sitting, Cuff Size: Normal)   Pulse 96   Wt 139 lb (63 kg)   LMP 02/08/1997   BMI 23.86 kg/m  Wt Readings from Last 3 Encounters:  02/08/19 139 lb (63 kg)  10/13/18 135 lb (61.2 kg)  03/24/17 134 lb 12.8 oz (61.1 kg)    Diabetic Foot Exam - Simple   No data filed     Lab Results  Component Value Date   WBC 5.2 03/24/2017   HGB 14.2 03/24/2017   HCT 42.4 03/24/2017   PLT 197.0 03/24/2017   GLUCOSE 84 03/24/2017   CHOL 136 03/24/2017   TRIG 98.0 03/24/2017   HDL 37.20 (L) 03/24/2017   LDLCALC 79 03/24/2017   ALT 15 03/24/2017   AST 16 03/24/2017   NA 139 03/24/2017   K 4.0 03/24/2017   CL 104 03/24/2017   CREATININE 0.77  03/24/2017   BUN 12 03/24/2017   CO2 31 03/24/2017   TSH 1.06 03/24/2017    Lab Results  Component Value Date   TSH 1.06 03/24/2017   Lab Results  Component Value Date   WBC 5.2 03/24/2017   HGB 14.2 03/24/2017   HCT 42.4 03/24/2017   MCV 101.2 (H) 03/24/2017   PLT 197.0 03/24/2017   Lab Results  Component Value Date   NA 139 03/24/2017   K 4.0 03/24/2017   CO2 31 03/24/2017   GLUCOSE 84 03/24/2017   BUN 12 03/24/2017   CREATININE 0.77 03/24/2017   BILITOT 0.6 03/24/2017   ALKPHOS 31 (L) 03/24/2017   AST 16 03/24/2017   ALT 15 03/24/2017   PROT 6.6 03/24/2017   ALBUMIN 4.0 03/24/2017   CALCIUM 9.3 03/24/2017   GFR 80.07 03/24/2017   Lab Results  Component Value Date   CHOL 136 03/24/2017   Lab Results  Component Value Date   HDL 37.20 (L) 03/24/2017   Lab Results  Component Value Date   LDLCALC 79 03/24/2017   Lab Results  Component Value Date   TRIG 98.0 03/24/2017   Lab Results  Component Value Date   CHOLHDL 4 03/24/2017   No results found for: HGBA1C     Assessment & Plan:   Problem List Items Addressed This Visit    Hypertension    Monitor and report any concerns, no changes to meds. Encouraged heart healthy diet such as the DASH diet and exercise as tolerated.       Extrinsic asthma  Given refill on Adviar and Albuterol      Relevant Medications   albuterol (VENTOLIN HFA) 108 (90 Base) MCG/ACT inhaler   Fluticasone-Salmeterol (ADVAIR) 100-50 MCG/DOSE AEPB   Asthma exacerbation    Tessalon perles, increase Albuterol use prn and started on Cefdinir and mucinex. Given refill on Advair      Relevant Medications   albuterol (VENTOLIN HFA) 108 (90 Base) MCG/ACT inhaler   Fluticasone-Salmeterol (ADVAIR) 100-50 MCG/DOSE AEPB   Preventative health care   Relevant Medications   albuterol (VENTOLIN HFA) 108 (90 Base) MCG/ACT inhaler   Acid reflux    Likely contributing to respiratory symptoms at night. Avoid offending foods, start  probiotics. Do not eat large meals in late evening and consider raising head of bed. Start on Famotidine 40 mg qhs.       Relevant Medications   famotidine (PEPCID) 40 MG tablet    Other Visit Diagnoses    Medication refill       Relevant Medications   Fluticasone-Salmeterol (ADVAIR) 100-50 MCG/DOSE AEPB      I have discontinued Kamaiya L. Jenkin's doxycycline, azithromycin, and azithromycin. I have also changed her benzonatate. Additionally, I am having her start on cefdinir, fluconazole, and famotidine. Lastly, I am having her maintain her ALPRAZolam, busPIRone, Est Estrogens-Methyltest HS, fluticasone, buPROPion, azelastine, lidocaine, cyclobenzaprine, nitroGLYCERIN, HYDROcodone-homatropine, bisoprolol, predniSONE, losartan, metoprolol succinate, albuterol, and Fluticasone-Salmeterol.  Meds ordered this encounter  Medications  . cefdinir (OMNICEF) 300 MG capsule    Sig: Take 1 capsule (300 mg total) by mouth 2 (two) times daily for 10 days.    Dispense:  20 capsule    Refill:  0  . benzonatate (TESSALON) 100 MG capsule    Sig: Take 1-2 capsules (100-200 mg total) by mouth 3 (three) times daily as needed for cough.    Dispense:  40 capsule    Refill:  1  . fluconazole (DIFLUCAN) 150 MG tablet    Sig: Take 1 tablet (150 mg total) by mouth once a week.    Dispense:  2 tablet    Refill:  1  . albuterol (VENTOLIN HFA) 108 (90 Base) MCG/ACT inhaler    Sig: INHALE 2 PUFFS INTO THE LUNGS EVERY 6 HOURS AS NEEDED FOR WHEEZING    Dispense:  18 g    Refill:  5  . Fluticasone-Salmeterol (ADVAIR) 100-50 MCG/DOSE AEPB    Sig: INHALE 1 PUFF INTO THE LUNGS 2 (TWO) TIMES DAILY.    Dispense:  60 each    Refill:  5  . famotidine (PEPCID) 40 MG tablet    Sig: Take 1 tablet (40 mg total) by mouth at bedtime.    Dispense:  30 tablet    Refill:  3      I discussed the assessment and treatment plan with the patient. The patient was provided an opportunity to ask questions and all were answered.  The patient agreed with the plan and demonstrated an understanding of the instructions.   The patient was advised to call back or seek an in-person evaluation if the symptoms worsen or if the condition fails to improve as anticipated.  I provided 25 minutes of non-face-to-face time during this encounter.   Penni Homans, MD

## 2019-02-11 NOTE — Assessment & Plan Note (Signed)
Monitor and report any concerns, no changes to meds. Encouraged heart healthy diet such as the DASH diet and exercise as tolerated.  ?

## 2019-02-13 ENCOUNTER — Other Ambulatory Visit: Payer: Self-pay | Admitting: Family Medicine

## 2019-03-16 ENCOUNTER — Other Ambulatory Visit: Payer: Self-pay | Admitting: Family Medicine

## 2019-04-11 ENCOUNTER — Other Ambulatory Visit: Payer: Self-pay | Admitting: Family Medicine

## 2019-04-19 ENCOUNTER — Other Ambulatory Visit: Payer: Self-pay | Admitting: Family Medicine

## 2019-04-22 ENCOUNTER — Other Ambulatory Visit: Payer: Self-pay | Admitting: Family Medicine

## 2019-08-16 ENCOUNTER — Other Ambulatory Visit: Payer: Self-pay | Admitting: Family Medicine

## 2019-11-15 ENCOUNTER — Other Ambulatory Visit: Payer: Self-pay | Admitting: Family Medicine

## 2019-12-25 ENCOUNTER — Ambulatory Visit: Payer: BC Managed Care – PPO | Admitting: Family

## 2019-12-25 ENCOUNTER — Telehealth: Payer: Self-pay

## 2019-12-25 ENCOUNTER — Encounter: Payer: Self-pay | Admitting: Family

## 2019-12-25 ENCOUNTER — Other Ambulatory Visit: Payer: Self-pay

## 2019-12-25 VITALS — BP 102/62 | HR 64 | Resp 16 | Ht 64.0 in | Wt 141.0 lb

## 2019-12-25 DIAGNOSIS — N3000 Acute cystitis without hematuria: Secondary | ICD-10-CM

## 2019-12-25 LAB — URINALYSIS, ROUTINE W REFLEX MICROSCOPIC
Bilirubin Urine: NEGATIVE
Hgb urine dipstick: NEGATIVE
Ketones, ur: NEGATIVE
Nitrite: POSITIVE — AB
RBC / HPF: NONE SEEN (ref 0–?)
Specific Gravity, Urine: 1.02 (ref 1.000–1.030)
Total Protein, Urine: NEGATIVE
Urine Glucose: NEGATIVE
Urobilinogen, UA: 0.2 (ref 0.0–1.0)
pH: 7.5 (ref 5.0–8.0)

## 2019-12-25 MED ORDER — CEPHALEXIN 500 MG PO CAPS: 500.0000 mg | ORAL_CAPSULE | Freq: Three times a day (TID) | ORAL | 0 refills | Status: DC

## 2019-12-25 NOTE — Telephone Encounter (Signed)
Nurse Assessment Nurse: Gilford Rile, RN, Ginny Date/Time (Eastern Time): 12/24/2019 5:08:56 PM Confirm and document reason for call. If symptomatic, describe symptoms. ---Caller stating that she is experiencing painful urination and that the color of urine is dark yellow. Stating present for a couple weeks and hasn't cleared up. Does the patient have any new or worsening symptoms? ---Yes Will a triage be completed? ---Yes Related visit to physician within the last 2 weeks? ---No Does the PT have any chronic conditions? (i.e. diabetes, asthma, this includes High risk factors for pregnancy, etc.) ---Yes List chronic conditions. ---HTN, Depression, HRT. Is this a behavioral health or substance abuse call? ---No Guidelines Guideline Title Affirmed Question Affirmed Notes Nurse Date/Time (Eastern Time) Urination Pain - Female [1] SEVERE pain with urination (e.g., excruciating) AND [2] not improved after 2 hours of pain medicine and [place], RN, Ginny 12/24/2019 5:11:18 PM Disp. Time Eilene Ghazi Time) Disposition Final User PLEASE NOTE: All timestamps contained within this report are represented as [place] Standard Time. CONFIDENTIALTY NOTICE: This fax transmission is intended only for the addressee. It contains information that is legally privileged, confidential or otherwise protected from use or disclosure. If you are not the intended recipient, you are strictly prohibited from reviewing, disclosing, copying using or disseminating any of this information or taking any action in reliance on or regarding this information. If you have received this fax in error, please notify us immediately by telephone so that we can arrange for its return to [place]. Phone: [phone], Toll-Free: [phone], Fax: [phone] Page: 2 of 2 Call Id: [id number] 12/24/2019 5:15:34 PM See HCP within 4 Hours (or PCP triage) Yes Gilford Rile, RN, Donia Guiles Caller Disagree/Comply Comply Caller Understands  Yes PreDisposition Call Doctor Care Advice Given Per Guideline SEE HCP (OR PCP TRIAGE) WITHIN 4 HOURS: * IF OFFICE WILL BE CLOSED AND PCP SECOND-LEVEL TRIAGE REQUIRED: You may need to be seen. Your doctor (or NP/PA) will want to talk with you to decide what's best. I'll page the oncall provider now. If you haven't heard from the provider (or me) within 30 minutes, call again. NOTE: If on-call provider can't be reached, send to [facility] or ED. * IBUPROFEN (E.G., MOTRIN, ADVIL): Take 400 mg (two 200 mg pills) by mouth every 6 hours. The most you should take each day is 1,200 mg (six 200 mg pills), unless your doctor has told you to take more. CALL BACK IF: * You become worse CARE ADVICE given per Urination Pain - Female (Adult) guideline. Referrals GO TO FACILITY OTHER - SPECIFY  Appt scheduled w/ Lenna Sciara

## 2019-12-25 NOTE — Progress Notes (Signed)
Subjective:    Patient ID: Casey Rowe, female    DOB: 1950/03/22, 69 y.o.   MRN: [id number]  HPI  Patient is a 69 yr old female who presents today with chief complaint of dysuria.  She reports that her urine has been dark in color as well. Symptoms began 2 weeks ago. Reports urinary urgency.  Has been drinking a lot of water.  She denies unusual low back pain, nausea/vomitting or fever.   Review of Systems    see HPI  Past Medical History:  Diagnosis Date   Allergy    Anxiety    Asthma    Broken jaw ([place]) 1980s   Depression    Depression with anxiety    Hypertension    Preventative health care 03/16/2015   Sarcoidosis    Tubal pregnancy 1981     Social History   Socioeconomic History   Marital status: Married    Spouse name: Not on file   Number of children: Not on file   Years of education: Not on file   Highest education level: Not on file  Occupational History   Not on file  Tobacco Use   Smoking status: Never Smoker   Smokeless tobacco: Never Used  Substance and Sexual Activity   Alcohol use: Yes   Drug use: No   Sexual activity: Yes    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[employer], lives with husband, 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, minimal red meat  Other Topics Concern   Not on file  Social History Narrative   Works as a Pharmacist, 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   Married   Grown children- 3         Social Determinants of Radio broadcast assistant Strain:    Difficulty of Paying Living Expenses: Not on file  Food Insecurity:    Worried About Charity fundraiser in the Last Year: Not on file   [employer] of Food in the Last Year: Not on file  Transportation Needs:    Lack of Transportation (Medical): Not on file   Lack of Transportation (Non-Medical): Not on file  Physical Activity:    Days of Exercise per Week: Not on file   Minutes of Exercise per Session: Not on file  Stress:    Feeling of Stress : Not on file  Social Connections:    Frequency of  Communication with Friends and Family: Not on file   Frequency of Social Gatherings with Friends and Family: Not on file   Attends Religious Services: Not on file   Active Member of Clubs or Organizations: Not on file   Attends Archivist Meetings: Not on file   Marital Status: Not on file  Intimate Partner Violence:    Fear of Current or Ex-Partner: Not on file   Emotionally Abused: Not on file   Physically Abused: Not on file   Sexually Abused: Not on file    Past Surgical History:  Procedure Laterality Date   ABDOMINAL HYSTERECTOMY     [place]   broken jaw    Family History  Problem Relation Age of Onset   Hypertension Mother    Dementia Mother    Diabetes Mother    Diabetes Father    Heart attack Father    Heart disease Father    Psoriasis Father    Cancer Sister        pancreatic  Hepatitis B Brother    Rashes / Skin problems Son    Alcohol abuse Sister        substance   Gout Brother    Hypertension Brother    Other Son        bad allergies, aspirin, dermatitis   Rashes / Skin problems Son    Cancer Maternal Aunt        breast    Allergies  Allergen Reactions   Demerol Nausea And Vomiting   Ibuprofen Other (See Comments)    bruises   Singulair [Montelukast]     Dry mouth/dehydration   Augmentin [Amoxicillin-Pot Clavulanate] Diarrhea    Caused Diarrhea    Current Outpatient Medications on File Prior to Visit  Medication Sig Dispense Refill   albuterol (VENTOLIN HFA) 108 (90 Base) MCG/ACT inhaler INHALE 2 PUFFS INTO THE LUNGS EVERY 6 HOURS AS NEEDED FOR WHEEZING 18 g 5   ALPRAZolam (XANAX) 0.25 MG tablet Take 0.25 mg by mouth daily.      azelastine (ASTELIN) 0.1 % nasal spray Place 2 sprays into both nostrils 2 (two) times daily. Use in each nostril as directed 30 mL 12   bisoprolol (ZEBETA) 5 MG tablet TAKE 1 TABLET BY MOUTH DAILY 90  tablet 2   buPROPion (WELLBUTRIN XL) 300 MG 24 hr tablet Take 300 mg by mouth daily.     busPIRone (BUSPAR) 5 MG tablet Take 5 mg by mouth 3 (three) times daily.      cyclobenzaprine (FLEXERIL) 10 MG tablet Take 1 tablet (10 mg total) by mouth at bedtime as needed for muscle spasms. 20 tablet 0   EST ESTROGENS-METHYLTEST HS 0.625-1.25 MG per tablet      fluticasone (FLONASE) 50 MCG/ACT nasal spray Place 1 spray into both nostrils daily. (Patient taking differently: Place 1 spray into both nostrils 2 (two) times daily. ) 16 g 6   Fluticasone-Salmeterol (ADVAIR) 100-50 MCG/DOSE AEPB INHALE 1 PUFF INTO THE LUNGS 2 (TWO) TIMES DAILY. 60 each 5   lidocaine (LMX) 4 % cream Apply 1 application topically 3 (three) times daily as needed. 133 g 0   losartan (COZAAR) 50 MG tablet TAKE ONE TABLET BY MOUTH DAILY 90 tablet 0   metoprolol succinate (TOPROL-XL) 50 MG 24 hr tablet TAKE 1 TABLET BY MOUTH DAILY. TAKE WITH OR IMMEDIATELY FOLLOWING A MEAL. 90 tablet 0   nitroGLYCERIN (NITROSTAT) 0.4 MG SL tablet Place 1 tablet (0.4 mg total) under the tongue every 5 (five) minutes as needed for chest pain. 25 tablet 1   No current facility-administered medications on file prior to visit.    BP 102/62 (BP Location: Left Arm, Patient Position: Sitting, Cuff Size: Normal)    Pulse 64    Resp 16    Ht 5\' 4"  (1.626 m)    Wt 141 lb (64 kg)    LMP 02/08/1997    SpO2 96%    BMI 24.20 kg/m    Objective:   Physical Exam Constitutional:      Appearance: She is well-developed.  Neck:     Thyroid: No thyromegaly.  Cardiovascular:     Rate and Rhythm: Normal rate and regular rhythm.     Heart sounds: Normal heart sounds. No murmur heard.   Pulmonary:     Effort: Pulmonary effort is normal. No respiratory distress.     Breath sounds: Normal breath sounds. No wheezing.  Abdominal:     Tenderness: There is abdominal tenderness in the suprapubic area. There is no right  CVA tenderness or left CVA tenderness.    Musculoskeletal:     Cervical back: Neck supple.  Skin:    General: Skin is warm and dry.  Neurological:     Mental Status: She is alert and oriented to person, place, and time.  Psychiatric:        Behavior: Behavior normal.        Thought Content: Thought content normal.        Judgment: Judgment normal.           Assessment & Plan:  UTI- symptoms most consistent with UTI. Will begin empiric keflex. Pt is advised to call if new/worsening symptoms or if symptoms fail to improve.   This visit occurred during the SARS-CoV-2 public health emergency.  Safety protocols were in place, including screening questions prior to the visit, additional usage of staff PPE, and extensive cleaning of exam room while observing appropriate contact time as indicated for disinfecting solutions.

## 2019-12-25 NOTE — Patient Instructions (Signed)
Please begin keflex.  Call if symptoms worsen or if they do not improve.

## 2019-12-27 LAB — URINE CULTURE
MICRO NUMBER:: 11209362
SPECIMEN QUALITY:: ADEQUATE

## 2020-01-09 ENCOUNTER — Encounter: Payer: BC Managed Care – PPO | Admitting: Obstetrics and Gynecology

## 2020-01-09 ENCOUNTER — Telehealth: Payer: Self-pay | Admitting: Family Medicine

## 2020-01-09 NOTE — Telephone Encounter (Signed)
Patient states her GYN doctor has retired. She needs her hormones medication but is unable to find a new GYN that would refill meds. Patient has an appt with a GYN on 01/16/20 however, she states is only a consultation. Patient wants to know if Dr. Charlett Blake would be able to prescribe hormone treatment.   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

## 2020-01-09 NOTE — Telephone Encounter (Signed)
I can likely take over at least temporarily but need to confirm what she is on and how long she has been on it.

## 2020-01-11 NOTE — Telephone Encounter (Signed)
Attempted to contact pt for info on medications requested a call back or mychart message listing medication and length of time on meds.

## 2020-01-13 ENCOUNTER — Other Ambulatory Visit: Payer: Self-pay | Admitting: Family Medicine

## 2020-01-16 ENCOUNTER — Encounter: Payer: BC Managed Care – PPO | Admitting: Obstetrics & Gynecology

## 2020-01-17 ENCOUNTER — Ambulatory Visit (INDEPENDENT_AMBULATORY_CARE_PROVIDER_SITE_OTHER): Payer: BC Managed Care – PPO | Admitting: Family Medicine

## 2020-01-17 ENCOUNTER — Encounter: Payer: Self-pay | Admitting: Family Medicine

## 2020-01-17 ENCOUNTER — Other Ambulatory Visit: Payer: Self-pay

## 2020-01-17 VITALS — BP 110/70 | HR 62 | Temp 97.0°F | Resp 16 | Wt 139.8 lb

## 2020-01-17 VITALS — BP 141/64 | HR 68 | Wt 139.0 lb

## 2020-01-17 DIAGNOSIS — Z23 Encounter for immunization: Secondary | ICD-10-CM

## 2020-01-17 DIAGNOSIS — N390 Urinary tract infection, site not specified: Secondary | ICD-10-CM | POA: Diagnosis not present

## 2020-01-17 DIAGNOSIS — N951 Menopausal and female climacteric states: Secondary | ICD-10-CM | POA: Diagnosis not present

## 2020-01-17 DIAGNOSIS — E785 Hyperlipidemia, unspecified: Secondary | ICD-10-CM

## 2020-01-17 DIAGNOSIS — D869 Sarcoidosis, unspecified: Secondary | ICD-10-CM | POA: Diagnosis not present

## 2020-01-17 DIAGNOSIS — Z01419 Encounter for gynecological examination (general) (routine) without abnormal findings: Secondary | ICD-10-CM

## 2020-01-17 DIAGNOSIS — R002 Palpitations: Secondary | ICD-10-CM

## 2020-01-17 DIAGNOSIS — I10 Essential (primary) hypertension: Secondary | ICD-10-CM | POA: Diagnosis not present

## 2020-01-17 DIAGNOSIS — M25552 Pain in left hip: Secondary | ICD-10-CM

## 2020-01-17 HISTORY — DX: Hyperlipidemia, unspecified: E78.5

## 2020-01-17 MED ORDER — ESTRADIOL 1 MG PO TABS: 1.0000 mg | ORAL_TABLET | Freq: Every day | ORAL | 1 refills | Status: DC

## 2020-01-17 MED ORDER — PROGESTERONE MICRONIZED 100 MG PO CAPS: 100.0000 mg | ORAL_CAPSULE | Freq: Every day | ORAL | 1 refills | Status: DC

## 2020-01-17 NOTE — Progress Notes (Signed)
GYNECOLOGY ANNUAL PREVENTATIVE CARE ENCOUNTER NOTE  Subjective:   Casey Rowe is a 68 y.o. [phone] female here for a routine annual gynecologic exam.  Current complaints: having vasomotor symptoms since menopause about 10-15 years ago. Was placed on methyltestosterone/estrogen combination pill and has been on it regularly without difficulty. Tried to go off it and had 2-3 epidoses a day of waves of vertigo, followed by nausea. Vertigo sensation last for several seconds, but the nausea would persist for 10-30 minutes. Resolved after restarting medication.   Denies abnormal vaginal bleeding, discharge, pelvic pain, problems with intercourse or other gynecologic concerns.    Gynecologic History Patient's last menstrual period was 02/08/1997. Patient is sexually active  Contraception: post menopausal status Last Pap: 2018. Results were: normal Last mammogram: 2020. Results were: normal  Obstetric History OB History  Gravida Para Term Preterm AB Living  6 3 2 1 3 3   SAB IAB Ectopic Multiple Live Births  2 1     3     # Outcome Date GA Lbr Len/2nd Weight Sex Delivery Anes PTL Lv  6 Term 1990 [redacted]w[redacted]d   M CS-LTranv Spinal N LIV  5 Preterm 1983 [redacted]w[redacted]d   F CS-LTranv EPI N LIV  4 Term 1973 [redacted]w[redacted]d   M Vag-Spont EPI N LIV  3 SAB           2 SAB           1 IAB             Past Medical History:  Diagnosis Date  . Allergy   . Anxiety   . Asthma   . Broken jaw ([place]) 1980s  . Depression   . Depression with anxiety   . Hypertension   . Preventative health care 03/16/2015  . Sarcoidosis   . Tubal pregnancy 1981    Past Surgical History:  Procedure Laterality Date  . ABDOMINAL HYSTERECTOMY    . CESAREAN SECTION    . ECTOPIC PREGNANCY SURGERY  1981  . FRACTURE SURGERY  1980s   broken jaw    Current Outpatient Medications on File Prior to Visit  Medication Sig Dispense Refill  . albuterol (VENTOLIN HFA) 108 (90 Base) MCG/ACT inhaler INHALE 2 PUFFS INTO THE LUNGS EVERY 6 HOURS AS  NEEDED FOR WHEEZING 18 g 5  . ALPRAZolam (XANAX) 0.25 MG tablet Take 0.25 mg by mouth daily.     Marland Kitchen azelastine (ASTELIN) 0.1 % nasal spray Place 2 sprays into both nostrils 2 (two) times daily. Use in each nostril as directed 30 mL 12  . bisoprolol (ZEBETA) 5 MG tablet TAKE 1 TABLET BY MOUTH DAILY 90 tablet 2  . buPROPion (WELLBUTRIN XL) 300 MG 24 hr tablet Take 300 mg by mouth daily.    . busPIRone (BUSPAR) 5 MG tablet Take 5 mg by mouth 3 (three) times daily.     Marland Kitchen EST ESTROGENS-METHYLTEST HS 0.625-1.25 MG per tablet     . fluticasone (FLONASE) 50 MCG/ACT nasal spray Place 1 spray into both nostrils daily. (Patient taking differently: Place 1 spray into both nostrils 2 (two) times daily.) 16 g 6  . Fluticasone-Salmeterol (ADVAIR) 100-50 MCG/DOSE AEPB INHALE 1 PUFF INTO THE LUNGS 2 (TWO) TIMES DAILY. 60 each 5  . losartan (COZAAR) 50 MG tablet Take 1 tablet (50 mg total) by mouth daily. 90 tablet 0  . metoprolol succinate (TOPROL-XL) 50 MG 24 hr tablet TAKE 1 TABLET BY MOUTH DAILY. TAKE WITH OR IMMEDIATELY FOLLOWING A MEAL. 90 tablet  0  . cephALEXin (KEFLEX) 500 MG capsule Take 1 capsule (500 mg total) by mouth 3 (three) times daily. (Patient not taking: Reported on 01/17/2020) 15 capsule 0  . cyclobenzaprine (FLEXERIL) 10 MG tablet Take 1 tablet (10 mg total) by mouth at bedtime as needed for muscle spasms. 20 tablet 0  . lidocaine (LMX) 4 % cream Apply 1 application topically 3 (three) times daily as needed. (Patient not taking: Reported on 01/17/2020) 133 g 0  . nitroGLYCERIN (NITROSTAT) 0.4 MG SL tablet Place 1 tablet (0.4 mg total) under the tongue every 5 (five) minutes as needed for chest pain. (Patient not taking: Reported on 01/17/2020) 25 tablet 1   No current facility-administered medications on file prior to visit.    Allergies  Allergen Reactions  . Demerol Nausea And Vomiting  . Ibuprofen Other (See Comments)    bruises  . Singulair [Montelukast]     Dry mouth/dehydration  .  Augmentin [Amoxicillin-Pot Clavulanate] Diarrhea    Caused Diarrhea    Social History   Socioeconomic History  . Marital status: Married    Spouse name: Not on file  . Number of children: Not on file  . Years of education: Not on file  . Highest education level: Not on file  Occupational History  . Not on file  Tobacco Use  . Smoking status: Never Smoker  . Smokeless tobacco: Never Used  Substance and Sexual Activity  . Alcohol use: Yes  . Drug use: No  . 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 Not on file  Social History Narrative   Works as a Pharmacist, 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   Married   Grown children- 3         Social Determinants of Radio broadcast assistant Strain: Not on file  Food Insecurity: Not on file  Transportation Needs: Not on file  Physical Activity: Not on file  Stress: Not on file  Social Connections: Not on file  Intimate Partner Violence: Not on file    Family History  Problem Relation Age of Onset  . Hypertension Mother   . Dementia Mother   . Diabetes Mother   . Diabetes Father   . Heart attack Father   . Heart disease Father   . Psoriasis Father   . Cancer Sister        pancreatic  . Hepatitis B Brother   . Rashes / Skin problems Son   . Alcohol abuse Sister        substance  . Gout Brother   . Hypertension Brother   . Other Son        bad allergies, aspirin, dermatitis  . Rashes / Skin problems Son   . Cancer Maternal Aunt        breast    The following portions of the patient's history were reviewed and updated as appropriate: allergies, current medications, past family history, past medical history, past social history, past surgical history and problem list.  Review of Systems Pertinent items are noted in HPI.   Objective:  BP (!) 141/64   Pulse 68   Wt 139 lb (63 kg)   LMP 02/08/1997   BMI 23.86 kg/m  Wt Readings from Last 3  Encounters:  01/17/20 139 lb (63 kg)  12/25/19 141 lb (64 kg)  02/08/19 139 lb (63 kg)     Chaperone present  during exam  CONSTITUTIONAL: Well-developed, well-nourished female in no acute distress.  HENT:  Normocephalic, atraumatic, External right and left ear normal. Oropharynx is clear and moist EYES: Conjunctivae and EOM are normal. Pupils are equal, round, and reactive to light. No scleral icterus.  NECK: Normal range of motion, supple, no masses.  Normal thyroid.   CARDIOVASCULAR: Normal heart rate noted, regular rhythm RESPIRATORY: Clear to auscultation bilaterally. Effort and breath sounds normal, no problems with respiration noted. BREASTS: Symmetric in size. No masses, skin changes, nipple drainage, or lymphadenopathy. ABDOMEN: Soft, normal bowel sounds, no distention noted.  No tenderness, rebound or guarding.  PELVIC: Normal appearing external genitalia; atrophic appearing vaginal mucosa and cervix.  No abnormal discharge noted.  Normal uterine size, no other palpable masses, no uterine or adnexal tenderness. MUSCULOSKELETAL: Normal range of motion. No tenderness.  No cyanosis, clubbing, or edema.  2+ distal pulses. SKIN: Skin is warm and dry. No rash noted. Not diaphoretic. No erythema. No pallor. NEUROLOGIC: Alert and oriented to person, place, and time. Normal reflexes, muscle tone coordination. No cranial nerve deficit noted. PSYCHIATRIC: Normal mood and affect. Normal behavior. Normal judgment and thought content.  Assessment:  Annual gynecologic examination with pap smear   Plan:  1. Well Woman Exam Mammogram scheduled  - MM DIGITAL SCREENING BILATERAL; Future  2. Vasomotor symptoms due to menopause Insurance no longer covers medication. Will switch to oral estradiol and prometrium. F/u in 26months or PRN   Routine preventative health maintenance measures emphasized. Please refer to After Visit Summary for other counseling recommendations.    Loma Boston,  [employer]

## 2020-01-17 NOTE — Patient Instructions (Addendum)
Lidocaine gel or patches to foot for pain  Gabapentin can go as high as 800 mg three x daily if needed and would uptitrate slowly Hypertension, Adult High blood pressure (hypertension) is when the force of blood pumping through the arteries is too strong. The arteries are the blood vessels that carry blood from the heart throughout the body. Hypertension forces the heart to work harder to pump blood and may cause arteries to become narrow or stiff. Untreated or uncontrolled hypertension can cause a heart attack, heart failure, a stroke, kidney disease, and other problems. A blood pressure reading consists of a higher number over a lower number. Ideally, your blood pressure should be below 120/80. The first ("top") number is called the systolic pressure. It is a measure of the pressure in your arteries as your heart beats. The second ("bottom") number is called the diastolic pressure. It is a measure of the pressure in your arteries as the heart relaxes. What are the causes? The exact cause of this condition is not known. There are some conditions that result in or are related to high blood pressure. What increases the risk? Some risk factors for high blood pressure are under your control. The following factors may make you more likely to develop this condition:  Smoking.  Having type 2 diabetes mellitus, high cholesterol, or both.  Not getting enough exercise or physical activity.  Being overweight.  Having too much fat, sugar, calories, or salt (sodium) in your diet.  Drinking too much alcohol. Some risk factors for high blood pressure may be difficult or impossible to change. Some of these factors include:  Having chronic kidney disease.  Having a family history of high blood pressure.  Age. Risk increases with age.  Race. You may be at higher risk if you are African American.  Gender. Men are at higher risk than women before age 40. After age 2, women are at higher risk than  men.  Having obstructive sleep apnea.  Stress. What are the signs or symptoms? High blood pressure may not cause symptoms. Very high blood pressure (hypertensive crisis) may cause:  Headache.  Anxiety.  Shortness of breath.  Nosebleed.  Nausea and vomiting.  Vision changes.  Severe chest pain.  Seizures. How is this diagnosed? This condition is diagnosed by measuring your blood pressure while you are seated, with your arm resting on a flat surface, your legs uncrossed, and your feet flat on the floor. The cuff of the blood pressure monitor will be placed directly against the skin of your upper arm at the level of your heart. It should be measured at least twice using the same arm. Certain conditions can cause a difference in blood pressure between your right and left arms. Certain factors can cause blood pressure readings to be lower or higher than normal for a short period of time:  When your blood pressure is higher when you are in a health care provider's office than when you are at home, this is called white coat hypertension. Most people with this condition do not need medicines.  When your blood pressure is higher at home than when you are in a health care provider's office, this is called masked hypertension. Most people with this condition may need medicines to control blood pressure. If you have a high blood pressure reading during one visit or you have normal blood pressure with other risk factors, you may be asked to:  Return on a different day to have your blood pressure  checked again.  Monitor your blood pressure at home for 1 week or longer. If you are diagnosed with hypertension, you may have other blood or imaging tests to help your health care provider understand your overall risk for other conditions. How is this treated? This condition is treated by making healthy lifestyle changes, such as eating healthy foods, exercising more, and reducing your alcohol  intake. Your health care provider may prescribe medicine if lifestyle changes are not enough to get your blood pressure under control, and if:  Your systolic blood pressure is above 130.  Your diastolic blood pressure is above 80. Your personal target blood pressure may vary depending on your medical conditions, your age, and other factors. Follow these instructions at home: Eating and drinking   Eat a diet that is high in fiber and potassium, and low in sodium, added sugar, and fat. An example eating plan is called the DASH (Dietary Approaches to Stop Hypertension) diet. To eat this way: ? Eat plenty of fresh fruits and vegetables. Try to fill one half of your plate at each meal with fruits and vegetables. ? Eat whole grains, such as whole-wheat pasta, brown rice, or whole-grain bread. Fill about one fourth of your plate with whole grains. ? Eat or drink low-fat dairy products, such as skim milk or low-fat yogurt. ? Avoid fatty cuts of meat, processed or cured meats, and poultry with skin. Fill about one fourth of your plate with lean proteins, such as fish, chicken without skin, beans, eggs, or tofu. ? Avoid pre-made and processed foods. These tend to be higher in sodium, added sugar, and fat.  Reduce your daily sodium intake. Most people with hypertension should eat less than 1,500 mg of sodium a day.  Do not drink alcohol if: ? Your health care provider tells you not to drink. ? You are pregnant, may be pregnant, or are planning to become pregnant.  If you drink alcohol: ? Limit how much you use to:  0-1 drink a day for women.  0-2 drinks a day for men. ? Be aware of how much alcohol is in your drink. In the [place] one drink equals one 12 oz bottle of beer (355 mL), one 5 oz glass of wine (148 mL), or one 1 oz glass of hard liquor (44 mL). Lifestyle   Work with your health care provider to maintain a healthy body weight or to lose weight. Ask what an ideal weight is for  you.  Get at least 30 minutes of exercise most days of the week. Activities may include walking, swimming, or biking.  Include exercise to strengthen your muscles (resistance exercise), such as Pilates or lifting weights, as part of your weekly exercise routine. Try to do these types of exercises for 30 minutes at least 3 days a week.  Do not use any products that contain nicotine or tobacco, such as cigarettes, e-cigarettes, and chewing tobacco. If you need help quitting, ask your health care provider.  Monitor your blood pressure at home as told by your health care provider.  Keep all follow-up visits as told by your health care provider. This is important. Medicines  Take over-the-counter and prescription medicines only as told by your health care provider. Follow directions carefully. Blood pressure medicines must be taken as prescribed.  Do not skip doses of blood pressure medicine. Doing this puts you at risk for problems and can make the medicine less effective.  Ask your health care provider about side effects or  reactions to medicines that you should watch for. Contact a health care provider if you:  Think you are having a reaction to a medicine you are taking.  Have headaches that keep coming back (recurring).  Feel dizzy.  Have swelling in your ankles.  Have trouble with your vision. Get help right away if you:  Develop a severe headache or confusion.  Have unusual weakness or numbness.  Feel faint.  Have severe pain in your chest or abdomen.  Vomit repeatedly.  Have trouble breathing. Summary  Hypertension is when the force of blood pumping through your arteries is too strong. If this condition is not controlled, it may put you at risk for serious complications.  Your personal target blood pressure may vary depending on your medical conditions, your age, and other factors. For most people, a normal blood pressure is less than 120/80.  Hypertension is  treated with lifestyle changes, medicines, or a combination of both. Lifestyle changes include losing weight, eating a healthy, low-sodium diet, exercising more, and limiting alcohol. This information is not intended to replace advice given to you by your health care provider. Make sure you discuss any questions you have with your health care provider. Document Revised: 10/05/2017 Document Reviewed: 10/05/2017 Elsevier Patient Education  2020 [employer].

## 2020-01-17 NOTE — Assessment & Plan Note (Signed)
Encouraged heart healthy diet, increase exercise, avoid trans fats, consider a krill oil cap daily 

## 2020-01-21 DIAGNOSIS — M25552 Pain in left hip: Secondary | ICD-10-CM

## 2020-01-21 HISTORY — DX: Pain in left hip: M25.552

## 2020-01-21 NOTE — Progress Notes (Signed)
Subjective:    Patient ID: Casey Rowe, female    DOB: 1950-10-16, 69 y.o.   MRN: [id number]  No chief complaint on file.   HPI Patient is in today for follow up on chronic medical concerns. No recent febrile illness or hospitalizations. No polyuria or polydipsia. No acute concerns. Is struggling with left hip and left leg pain. It does limit her activity to some degree. Denies CP/palp/SOB/HA/congestion/fevers/GI or GU c/o. Taking meds as prescribed  Past Medical History:  Diagnosis Date  . Allergy   . Anxiety   . Asthma   . Broken jaw ([place]) 1980s  . Depression   . Depression with anxiety   . Hypertension   . Preventative health care 03/16/2015  . Sarcoidosis   . Tubal pregnancy 1981    Past Surgical History:  Procedure Laterality Date  . ABDOMINAL HYSTERECTOMY    . CESAREAN SECTION    . ECTOPIC PREGNANCY SURGERY  1981  . FRACTURE SURGERY  1980s   broken jaw    Family History  Problem Relation Age of Onset  . Hypertension Mother   . Dementia Mother   . Diabetes Mother   . Diabetes Father   . Heart attack Father   . Heart disease Father   . Psoriasis Father   . Cancer Sister        pancreatic  . Hepatitis B Brother   . Rashes / Skin problems Son   . Alcohol abuse Sister        substance  . Gout Brother   . Hypertension Brother   . Other Son        bad allergies, aspirin, dermatitis  . Rashes / Skin problems Son   . Cancer Maternal Aunt        breast    Social History   Socioeconomic History  . Marital status: Married    Spouse name: Not on file  . Number of children: Not on file  . Years of education: Not on file  . Highest education level: Not on file  Occupational History  . Not on file  Tobacco Use  . Smoking status: Never Smoker  . Smokeless tobacco: Never Used  Substance and Sexual Activity  . Alcohol use: Yes  . Drug use: No  . 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 Not on file  Social History Narrative   Works as a Pharmacist, 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   Married   Grown children- 3         Social Determinants of Radio broadcast assistant Strain: Not on file  Food Insecurity: Not on file  Transportation Needs: Not on file  Physical Activity: Not on file  Stress: Not on file  Social Connections: Not on file  Intimate Partner Violence: Not on file    Outpatient Medications Prior to Visit  Medication Sig Dispense Refill  . albuterol (VENTOLIN HFA) 108 (90 Base) MCG/ACT inhaler INHALE 2 PUFFS INTO THE LUNGS EVERY 6 HOURS AS NEEDED FOR WHEEZING 18 g 5  . ALPRAZolam (XANAX) 0.25 MG tablet Take 0.25 mg by mouth daily.     Marland Kitchen azelastine (ASTELIN) 0.1 % nasal spray Place 2 sprays into both nostrils 2 (two) times daily. Use in each nostril as directed 30 mL 12  . bisoprolol (ZEBETA) 5 MG tablet TAKE 1 TABLET BY MOUTH DAILY 90 tablet 2  .  buPROPion (WELLBUTRIN XL) 300 MG 24 hr tablet Take 300 mg by mouth daily.    . busPIRone (BUSPAR) 5 MG tablet Take 5 mg by mouth 3 (three) times daily.     Marland Kitchen estradiol (ESTRACE) 1 MG tablet Take 1 tablet (1 mg total) by mouth daily. 90 tablet 1  . losartan (COZAAR) 50 MG tablet Take 1 tablet (50 mg total) by mouth daily. 90 tablet 0  . metoprolol succinate (TOPROL-XL) 50 MG 24 hr tablet TAKE 1 TABLET BY MOUTH DAILY. TAKE WITH OR IMMEDIATELY FOLLOWING A MEAL. 90 tablet 0  . progesterone (PROMETRIUM) 100 MG capsule Take 1 capsule (100 mg total) by mouth daily. 90 capsule 1  . EST ESTROGENS-METHYLTEST HS 0.625-1.25 MG per tablet     . Fluticasone-Salmeterol (ADVAIR) 100-50 MCG/DOSE AEPB INHALE 1 PUFF INTO THE LUNGS 2 (TWO) TIMES DAILY. 60 each 5  . cephALEXin (KEFLEX) 500 MG capsule Take 1 capsule (500 mg total) by mouth 3 (three) times daily. (Patient not taking: No sig reported) 15 capsule 0  . cyclobenzaprine (FLEXERIL) 10 MG tablet Take 1 tablet (10 mg total) by mouth at bedtime as needed for muscle  spasms. 20 tablet 0  . fluticasone (FLONASE) 50 MCG/ACT nasal spray Place 1 spray into both nostrils daily. (Patient not taking: Reported on 01/17/2020) 16 g 6  . lidocaine (LMX) 4 % cream Apply 1 application topically 3 (three) times daily as needed. (Patient not taking: No sig reported) 133 g 0  . nitroGLYCERIN (NITROSTAT) 0.4 MG SL tablet Place 1 tablet (0.4 mg total) under the tongue every 5 (five) minutes as needed for chest pain. (Patient not taking: No sig reported) 25 tablet 1   No facility-administered medications prior to visit.    Allergies  Allergen Reactions  . Demerol Nausea And Vomiting  . Ibuprofen Other (See Comments)    bruises  . Singulair [Montelukast]     Dry mouth/dehydration  . Augmentin [Amoxicillin-Pot Clavulanate] Diarrhea    Caused Diarrhea    Review of Systems  Constitutional: Negative for fever and malaise/fatigue.  HENT: Negative for congestion.   Eyes: Negative for blurred vision.  Respiratory: Negative for shortness of breath.   Cardiovascular: Negative for chest pain, palpitations and leg swelling.  Gastrointestinal: Negative for abdominal pain, blood in stool and nausea.  Genitourinary: Negative for dysuria and frequency.  Musculoskeletal: Positive for myalgias. Negative for falls.  Skin: Negative for rash.  Neurological: Negative for dizziness, loss of consciousness and headaches.  Endo/Heme/Allergies: Negative for environmental allergies.  Psychiatric/Behavioral: Negative for depression. The patient is not nervous/anxious.        Objective:    Physical Exam Vitals and nursing note reviewed.  Constitutional:      General: She is not in acute distress.    Appearance: She is well-developed and well-nourished.  HENT:     Head: Normocephalic and atraumatic.     Nose: Nose normal.  Eyes:     General:        Right eye: No discharge.        Left eye: No discharge.  Cardiovascular:     Rate and Rhythm: Normal rate and regular rhythm.      Heart sounds: No murmur heard.   Pulmonary:     Effort: Pulmonary effort is normal.     Breath sounds: Normal breath sounds.  Abdominal:     General: Bowel sounds are normal.     Palpations: Abdomen is soft.     Tenderness: There is no  abdominal tenderness.  Musculoskeletal:        General: No edema.     Cervical back: Normal range of motion and neck supple.  Skin:    General: Skin is warm and dry.  Neurological:     Mental Status: She is alert and oriented to person, place, and time.  Psychiatric:        Mood and Affect: Mood and affect normal.     BP 110/70   Pulse 62   Temp (!) 97 F (36.1 C) (Temporal)   Resp 16   Wt 139 lb 12.8 oz (63.4 kg)   LMP 02/08/1997   SpO2 99%   BMI 24.00 kg/m  Wt Readings from Last 3 Encounters:  01/17/20 139 lb 12.8 oz (63.4 kg)  01/17/20 139 lb (63 kg)  12/25/19 141 lb (64 kg)    Diabetic Foot Exam - Simple   No data filed    Lab Results  Component Value Date   WBC 5.2 03/24/2017   HGB 14.2 03/24/2017   HCT 42.4 03/24/2017   PLT 197.0 03/24/2017   GLUCOSE 84 03/24/2017   CHOL 136 03/24/2017   TRIG 98.0 03/24/2017   HDL 37.20 (L) 03/24/2017   LDLCALC 79 03/24/2017   ALT 15 03/24/2017   AST 16 03/24/2017   NA 139 03/24/2017   K 4.0 03/24/2017   CL 104 03/24/2017   CREATININE 0.77 03/24/2017   BUN 12 03/24/2017   CO2 31 03/24/2017   TSH 1.06 03/24/2017    Lab Results  Component Value Date   TSH 1.06 03/24/2017   Lab Results  Component Value Date   WBC 5.2 03/24/2017   HGB 14.2 03/24/2017   HCT 42.4 03/24/2017   MCV 101.2 (H) 03/24/2017   PLT 197.0 03/24/2017   Lab Results  Component Value Date   NA 139 03/24/2017   K 4.0 03/24/2017   CO2 31 03/24/2017   GLUCOSE 84 03/24/2017   BUN 12 03/24/2017   CREATININE 0.77 03/24/2017   BILITOT 0.6 03/24/2017   ALKPHOS 31 (L) 03/24/2017   AST 16 03/24/2017   ALT 15 03/24/2017   PROT 6.6 03/24/2017   ALBUMIN 4.0 03/24/2017   CALCIUM 9.3 03/24/2017   GFR 80.07  03/24/2017   Lab Results  Component Value Date   CHOL 136 03/24/2017   Lab Results  Component Value Date   HDL 37.20 (L) 03/24/2017   Lab Results  Component Value Date   LDLCALC 79 03/24/2017   Lab Results  Component Value Date   TRIG 98.0 03/24/2017   Lab Results  Component Value Date   CHOLHDL 4 03/24/2017   No results found for: HGBA1C     Assessment & Plan:   Problem List Items Addressed This Visit    Hypertension    Well controlled, no changes to meds. Encouraged heart healthy diet such as the DASH diet and exercise as tolerated.       Relevant Orders   CBC   Comprehensive metabolic panel   Lipid panel   TSH   Sarcoidosis   Palpitation   Relevant Orders   Lipid panel   Hyperlipidemia    Encouraged heart healthy diet, increase exercise, avoid trans fats, consider a krill oil cap daily      Left hip pain    She had surgery on it in June of 2020 at [facility]. She has had a lipoma and neuroma removed from that extremity but she is still in pain. She may try Lidocaine  gel and ice and follow up with orthopaedics.        Other Visit Diagnoses    Urinary tract infection without hematuria, site unspecified    -  Primary   Relevant Orders   Urinalysis   Urine Culture   Need for influenza vaccination       Relevant Orders   Flu Vaccine QUAD High Dose(Fluad) (Completed)      I have discontinued Kaci L. Bring's cyclobenzaprine and cephALEXin. I am also having her maintain her ALPRAZolam, busPIRone, buPROPion, azelastine, bisoprolol, albuterol, Fluticasone-Salmeterol, metoprolol succinate, losartan, estradiol, and progesterone.  No orders of the defined types were placed in this encounter.    Penni Homans, MD

## 2020-01-21 NOTE — Assessment & Plan Note (Signed)
Well controlled, no changes to meds. Encouraged heart healthy diet such as the DASH diet and exercise as tolerated.  °

## 2020-01-21 NOTE — Assessment & Plan Note (Signed)
She had surgery on it in June of 2020 at [facility]. She has had a lipoma and neuroma removed from that extremity but she is still in pain. She may try Lidocaine gel and ice and follow up with orthopaedics.

## 2020-01-24 ENCOUNTER — Other Ambulatory Visit: Payer: Self-pay

## 2020-01-24 ENCOUNTER — Other Ambulatory Visit (INDEPENDENT_AMBULATORY_CARE_PROVIDER_SITE_OTHER): Payer: BC Managed Care – PPO

## 2020-01-24 DIAGNOSIS — N390 Urinary tract infection, site not specified: Secondary | ICD-10-CM

## 2020-01-24 DIAGNOSIS — I10 Essential (primary) hypertension: Secondary | ICD-10-CM

## 2020-01-24 DIAGNOSIS — R002 Palpitations: Secondary | ICD-10-CM | POA: Diagnosis not present

## 2020-01-24 LAB — CBC
HCT: 44.4 % (ref 36.0–46.0)
Hemoglobin: 14.8 g/dL (ref 12.0–15.0)
MCHC: 33.4 g/dL (ref 30.0–36.0)
MCV: 100.6 fl — ABNORMAL HIGH (ref 78.0–100.0)
Platelets: 189 10*3/uL (ref 150.0–400.0)
RBC: 4.42 Mil/uL (ref 3.87–5.11)
RDW: 12.8 % (ref 11.5–15.5)
WBC: 4.1 10*3/uL (ref 4.0–10.5)

## 2020-01-24 LAB — URINALYSIS
Bilirubin Urine: NEGATIVE
Hgb urine dipstick: NEGATIVE
Ketones, ur: NEGATIVE
Leukocytes,Ua: NEGATIVE
Nitrite: NEGATIVE
Specific Gravity, Urine: 1.015 (ref 1.000–1.030)
Total Protein, Urine: NEGATIVE
Urine Glucose: NEGATIVE
Urobilinogen, UA: 0.2 (ref 0.0–1.0)
pH: 8 (ref 5.0–8.0)

## 2020-01-24 LAB — COMPREHENSIVE METABOLIC PANEL
ALT: 16 U/L (ref 0–35)
AST: 17 U/L (ref 0–37)
Albumin: 3.9 g/dL (ref 3.5–5.2)
Alkaline Phosphatase: 45 U/L (ref 39–117)
BUN: 9 mg/dL (ref 6–23)
CO2: 32 mEq/L (ref 19–32)
Calcium: 9.2 mg/dL (ref 8.4–10.5)
Chloride: 103 mEq/L (ref 96–112)
Creatinine, Ser: 0.9 mg/dL (ref 0.40–1.20)
GFR: 66.2 mL/min (ref >60.00)
Glucose, Bld: 80 mg/dL (ref 70–99)
Potassium: 4.3 mEq/L (ref 3.5–5.1)
Sodium: 138 mEq/L (ref 135–145)
Total Bilirubin: 0.4 mg/dL (ref 0.2–1.2)
Total Protein: 6.4 g/dL (ref 6.0–8.3)

## 2020-01-24 LAB — LIPID PANEL
Cholesterol: 172 mg/dL (ref 0–200)
HDL: 35.8 mg/dL — ABNORMAL LOW (ref >39.00)
LDL Cholesterol: 104 mg/dL — ABNORMAL HIGH (ref 0–99)
NonHDL: 136.42
Total CHOL/HDL Ratio: 5
Triglycerides: 161 mg/dL — ABNORMAL HIGH (ref 0.0–149.0)
VLDL: 32.2 mg/dL (ref 0.0–40.0)

## 2020-01-24 LAB — TSH: TSH: 1.22 u[IU]/mL (ref 0.35–4.50)

## 2020-01-26 LAB — URINE CULTURE
MICRO NUMBER:: 11326463
SPECIMEN QUALITY:: ADEQUATE

## 2020-01-27 ENCOUNTER — Other Ambulatory Visit: Payer: Self-pay | Admitting: Family Medicine

## 2020-01-27 MED ORDER — NITROFURANTOIN MONOHYD MACRO 100 MG PO CAPS: 100.0000 mg | ORAL_CAPSULE | Freq: Two times a day (BID) | ORAL | 0 refills | Status: DC

## 2020-02-28 ENCOUNTER — Other Ambulatory Visit: Payer: Self-pay | Admitting: Family Medicine

## 2020-03-17 ENCOUNTER — Encounter: Payer: Self-pay | Admitting: Family Medicine

## 2020-03-17 ENCOUNTER — Ambulatory Visit (HOSPITAL_BASED_OUTPATIENT_CLINIC_OR_DEPARTMENT_OTHER): Payer: BC Managed Care – PPO

## 2020-03-17 ENCOUNTER — Ambulatory Visit (HOSPITAL_BASED_OUTPATIENT_CLINIC_OR_DEPARTMENT_OTHER)
Admission: RE | Admit: 2020-03-17 | Discharge: 2020-03-17 | Disposition: A | Discharge status: Home / Self Care | Payer: BC Managed Care – PPO | Source: Ambulatory Visit | Attending: Family Medicine | Admitting: Family Medicine

## 2020-03-17 ENCOUNTER — Ambulatory Visit (INDEPENDENT_AMBULATORY_CARE_PROVIDER_SITE_OTHER): Payer: BC Managed Care – PPO | Admitting: Family Medicine

## 2020-03-17 ENCOUNTER — Encounter (HOSPITAL_BASED_OUTPATIENT_CLINIC_OR_DEPARTMENT_OTHER): Payer: Self-pay

## 2020-03-17 ENCOUNTER — Other Ambulatory Visit: Payer: Self-pay

## 2020-03-17 DIAGNOSIS — I10 Essential (primary) hypertension: Secondary | ICD-10-CM | POA: Diagnosis not present

## 2020-03-17 DIAGNOSIS — L719 Rosacea, unspecified: Secondary | ICD-10-CM | POA: Insufficient documentation

## 2020-03-17 DIAGNOSIS — N951 Menopausal and female climacteric states: Secondary | ICD-10-CM

## 2020-03-17 DIAGNOSIS — Z1231 Encounter for screening mammogram for malignant neoplasm of breast: Secondary | ICD-10-CM | POA: Diagnosis present

## 2020-03-17 DIAGNOSIS — R002 Palpitations: Secondary | ICD-10-CM | POA: Diagnosis not present

## 2020-03-17 DIAGNOSIS — Z01419 Encounter for gynecological examination (general) (routine) without abnormal findings: Secondary | ICD-10-CM

## 2020-03-17 HISTORY — DX: Menopausal and female climacteric states: N95.1

## 2020-03-17 HISTORY — DX: Rosacea, unspecified: L71.9

## 2020-03-17 MED ORDER — METRONIDAZOLE 0.75 % EX LOTN: 1.0000 | TOPICAL_LOTION | Freq: Every day | CUTANEOUS | 3 refills | Status: DC

## 2020-03-17 NOTE — Patient Instructions (Signed)
Rosacea Rosacea is a long-term (chronic) condition that affects the skin of the face, including the cheeks, nose, forehead, and chin. This condition can also affect the eyes. Rosacea causes blood vessels near the surface of the skin to enlarge, which results in redness. What are the causes? The cause of this condition is not known. Certain triggers can make rosacea worse, including:  Hot baths.  Exercise.  Sunlight.  Very hot or cold temperatures.  Hot or spicy foods and drinks.  Drinking alcohol.  Stress.  Taking blood pressure medicine.  Long-term use of topical steroids on the face. What increases the risk? You are more likely to develop this condition if you:  Are older than 68 years of age.  Are a woman.  Have light-colored skin (light complexion).  Have a family history of rosacea. What are the signs or symptoms? Symptoms of this condition include:  Redness of the face.  Red bumps or pimples on the face.  A red, enlarged nose.  Blushing easily.  Red lines on the skin.  Irritated, burning, or itchy feeling in the eyes.  Swollen eyelids.  Drainage from the eyes.  Feeling like there is something in your eye.   How is this diagnosed? This condition is diagnosed with a medical history and physical exam. How is this treated? There is no cure for this condition, but treatment can help to control your symptoms. Your health care provider may recommend that you see a skin specialist (dermatologist). Treatment may include:  Medicines that are applied to the skin or taken by mouth (orally). This can include antibiotic medicines.  Laser treatment to improve the appearance of the skin.  Surgery. This is rare. Your health care provider will also recommend the best way to take care of your skin. Even after your skin improves, you will likely need to continue treatment to prevent your rosacea from coming back. Follow these instructions at home: Skin care Take  care of your skin as told by your health care provider. You may be told to do these things:  Wash your skin gently two or more times each day.  Use mild soap.  Use a sunscreen or sunblock with SPF 30 or greater.  Use gentle cosmetics that are meant for sensitive skin.  Shave with an electric shaver instead of a blade. Lifestyle  Try to keep track of what foods trigger this condition. Avoid any triggers. These may include: ? Spicy foods. ? Seafood. ? Cheese. ? Hot liquids. ? Nuts. ? Chocolate. ? Iodized salt.  Do not drink alcohol.  Avoid extremely cold or hot temperatures.  Try to reduce your stress. If you need help, talk with your health care provider.  When you exercise, do these things to stay cool: ? Limit sun exposure to your face. ? Use a fan. ? Do shorter and more frequent intervals of exercise. General instructions  Take and apply over-the-counter and prescription medicines only as told by your health care provider.  If you were prescribed an antibiotic medicine, apply it or take it as told by your health care provider. Do not stop using the antibiotic even if your condition improves.  If your eyelids are affected, apply warm compresses to them. Do this as told by your health care provider.  Keep all follow-up visits as told by your health care provider. This is important. Contact a health care provider if:  Your symptoms get worse.  Your symptoms do not improve after 2 months of treatment.  You  have new symptoms.  You have any changes in vision or you have problems with your eyes, such as redness or itching.  You feel depressed.  You lose your appetite.  You have trouble concentrating. Summary  Rosacea is a long-term (chronic) condition that affects the skin of the face, including the cheeks, nose, forehead, and chin.  Take care of your skin as told by your health care provider.  Take and apply over-the-counter and prescription medicines only as  told by your health care provider.  Contact a health care provider if your symptoms get worse or if you have any changes in vision or other problems with your eyes, such as redness or itching.  Keep all follow-up visits as told by your health care provider. This is important. This information is not intended to replace advice given to you by your health care provider. Make sure you discuss any questions you have with your health care provider. Document Revised: 06/29/2017 Document Reviewed: 06/29/2017 Elsevier Patient Education  2021 [employer]  

## 2020-03-17 NOTE — Assessment & Plan Note (Signed)
No recent flare.  

## 2020-03-17 NOTE — Assessment & Plan Note (Signed)
Started on Metronidazole topically daily and see if that helps. Consider addition of Gabapentin

## 2020-03-17 NOTE — Assessment & Plan Note (Signed)
Well controlled, no changes to meds. Encouraged heart healthy diet such as the DASH diet and exercise as tolerated.  °

## 2020-03-17 NOTE — Progress Notes (Signed)
Subjective:    Patient ID: Casey Rowe, female    DOB: [birth year], 70 y.o.   MRN: [id number]  Chief Complaint  Patient presents with  . Dizziness    HPI Patient is in today for evaluation of some episodes of flushing. She is describing intermittent episodes of what she describes as a buzzing feeling that starts low and spreads up her body. Only lasts about 30-60 seconds and is followed by some nausea. She feels she has to sit down and close her eyes but she denies spinning, headache, visual or hearing changes, SOB, CP, palpitations, recent illness. She acknowledges a good deal of stress with work and also one of her sister's died recently of an overdose. She also notes that it started after she stopped her hormones after her GYN retired. When she went back on it stopped but has recently recurred despite the fact she is on them still. Denies CP/palp/SOB/HA/congestion/fevers/GI or GU c/o. Taking meds as prescribed  Past Medical History:  Diagnosis Date  . Allergy   . Anxiety   . Asthma   . Broken jaw ([place]) 1980s  . Depression   . Depression with anxiety   . Hypertension   . Preventative health care 03/16/2015  . Sarcoidosis   . Tubal pregnancy 1981    Past Surgical History:  Procedure Laterality Date  . ABDOMINAL HYSTERECTOMY    . CESAREAN SECTION    . ECTOPIC PREGNANCY SURGERY  1981  . FRACTURE SURGERY  1980s   broken jaw    Family History  Problem Relation Age of Onset  . Hypertension Mother   . Dementia Mother   . Diabetes Mother   . Diabetes Father   . Heart attack Father   . Heart disease Father   . Psoriasis Father   . Cancer Sister        pancreatic  . Hepatitis B Brother   . Rashes / Skin problems Son   . Alcohol abuse Sister        substance  . Gout Brother   . Hypertension Brother   . Other Son        bad allergies, aspirin, dermatitis  . Rashes / Skin problems Son   . Cancer Maternal Aunt        breast  . Breast cancer Maternal Aunt     Social  History   Socioeconomic History  . Marital status: Married    Spouse name: Not on file  . Number of children: Not on file  . Years of education: Not on file  . Highest education level: Not on file  Occupational History  . Not on file  Tobacco Use  . Smoking status: Never Smoker  . Smokeless tobacco: Never Used  Substance and Sexual Activity  . Alcohol use: Yes  . Drug use: No  . 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 albuterol (VENTOLIN HFA) 108 (90 Base) MCG/ACT inhaler INHALE 2 PUFFS INTO THE LUNGS EVERY 6 HOURS AS NEEDED FOR WHEEZING 18 g 5  . ALPRAZolam (XANAX) 0.25 MG tablet Take 0.25 mg by mouth daily.     Marland Kitchen azelastine (ASTELIN) 0.1 % nasal spray Place 2 sprays into both nostrils 2 (two) times daily. Use in each nostril as directed 30 mL 12  . buPROPion (WELLBUTRIN XL) 300 MG 24 hr tablet Take 300 mg by mouth daily.    . busPIRone (BUSPAR) 5 MG tablet Take 5 mg by mouth 3 (three) times daily.     Marland Kitchen estradiol (ESTRACE) 1 MG tablet Take 1 tablet (1 mg total) by mouth daily. 90 tablet 1  . Fluticasone-Salmeterol (ADVAIR) 100-50 MCG/DOSE AEPB INHALE 1 PUFF INTO THE LUNGS 2 (TWO) TIMES DAILY. 60 each 5  . losartan (COZAAR) 50 MG tablet Take 1 tablet (50 mg total) by mouth daily. 90 tablet 0  . metoprolol succinate (TOPROL-XL) 50 MG 24 hr tablet Take 1 tablet (50 mg total) by mouth daily. Take with or immediately following a meal  90 tablet 1  . progesterone (PROMETRIUM) 100 MG capsule Take 1 capsule (100 mg total) by mouth daily. 90 capsule 1  . bisoprolol (ZEBETA) 5 MG tablet TAKE 1 TABLET BY MOUTH DAILY 90 tablet 2  . nitrofurantoin, macrocrystal-monohydrate, (MACROBID) 100 MG capsule Take 1 capsule (100 mg total) by mouth 2 (two) times daily. 14 capsule 0   No facility-administered medications prior to visit.    Allergies  Allergen Reactions  . Demerol Nausea And Vomiting  . Ibuprofen Other (See Comments)    bruises  . Singulair [Montelukast]     Dry mouth/dehydration  . Augmentin [Amoxicillin-Pot Clavulanate] Diarrhea    Caused Diarrhea    Review of Systems  Constitutional: Positive for malaise/fatigue. Negative for chills and fever.  HENT: Negative for congestion and hearing loss.   Eyes: Negative for discharge.  Respiratory: Negative for cough, sputum production and shortness of breath.   Cardiovascular: Negative for chest pain, palpitations and leg swelling.  Gastrointestinal: Negative for abdominal pain, blood in stool, constipation, diarrhea, heartburn, nausea and vomiting.  Genitourinary: Negative for dysuria, frequency, hematuria and urgency.  Musculoskeletal: Negative for back pain, falls and myalgias.  Skin: Negative for rash.  Neurological: Negative for dizziness, sensory change, loss of consciousness, weakness and headaches.  Endo/Heme/Allergies: Negative for environmental allergies. Does not bruise/bleed easily.  Psychiatric/Behavioral: Negative for depression and suicidal ideas. The patient is not nervous/anxious and does not have insomnia.        Objective:    Physical Exam Vitals and nursing note reviewed.  Constitutional:      General: She is not in acute distress.    Appearance: She is well-developed and well-nourished.  HENT:     Head: Normocephalic and atraumatic.     Nose: Nose normal.  Eyes:     General:        Right eye: No discharge.        Left eye: No discharge.   Cardiovascular:     Rate and Rhythm: Normal rate and regular rhythm.     Heart sounds: No murmur heard.   Pulmonary:     Effort: Pulmonary effort is normal.     Breath sounds: Normal breath sounds.  Abdominal:     General: Bowel sounds are normal.     Palpations: Abdomen is soft.     Tenderness: There is  no abdominal tenderness.  Musculoskeletal:        General: No edema.     Cervical back: Normal range of motion and neck supple.  Skin:    General: Skin is warm and dry.     Findings: Erythema and rash present.     Comments: On face and neck  Neurological:     Mental Status: She is alert and oriented to person, place, and time.  Psychiatric:        Mood and Affect: Mood and affect normal.     BP 120/66   Pulse 75   Temp 97.9 F (36.6 C)   Resp 16   Wt 137 lb 9.6 oz (62.4 kg)   LMP 02/08/1997   SpO2 98%   BMI 23.62 kg/m  Wt Readings from Last 3 Encounters:  03/17/20 137 lb 9.6 oz (62.4 kg)  01/17/20 139 lb 12.8 oz (63.4 kg)  01/17/20 139 lb (63 kg)    Diabetic Foot Exam - Simple   No data filed    Lab Results  Component Value Date   WBC 4.1 01/24/2020   HGB 14.8 01/24/2020   HCT 44.4 01/24/2020   PLT 189.0 01/24/2020   GLUCOSE 80 01/24/2020   CHOL 172 01/24/2020   TRIG 161.0 (H) 01/24/2020   HDL 35.80 (L) 01/24/2020   LDLCALC 104 (H) 01/24/2020   ALT 16 01/24/2020   AST 17 01/24/2020   NA 138 01/24/2020   K 4.3 01/24/2020   CL 103 01/24/2020   CREATININE 0.90 01/24/2020   BUN 9 01/24/2020   CO2 32 01/24/2020   TSH 1.22 01/24/2020    Lab Results  Component Value Date   TSH 1.22 01/24/2020   Lab Results  Component Value Date   WBC 4.1 01/24/2020   HGB 14.8 01/24/2020   HCT 44.4 01/24/2020   MCV 100.6 (H) 01/24/2020   PLT 189.0 01/24/2020   Lab Results  Component Value Date   NA 138 01/24/2020   K 4.3 01/24/2020   CO2 32 01/24/2020   GLUCOSE 80 01/24/2020   BUN 9 01/24/2020   CREATININE 0.90 01/24/2020   BILITOT 0.4 01/24/2020    ALKPHOS 45 01/24/2020   AST 17 01/24/2020   ALT 16 01/24/2020   PROT 6.4 01/24/2020   ALBUMIN 3.9 01/24/2020   CALCIUM 9.2 01/24/2020   GFR 66.20 01/24/2020   Lab Results  Component Value Date   CHOL 172 01/24/2020   Lab Results  Component Value Date   HDL 35.80 (L) 01/24/2020   Lab Results  Component Value Date   LDLCALC 104 (H) 01/24/2020   Lab Results  Component Value Date   TRIG 161.0 (H) 01/24/2020   Lab Results  Component Value Date   CHOLHDL 5 01/24/2020   No results found for: HGBA1C     Assessment & Plan:   Problem List Items Addressed This Visit    Hypertension    Well controlled, no changes to meds. Encouraged heart healthy diet such as the DASH diet and exercise as tolerated.       Palpitation    No recent flare.      Rosacea    Started on Metronidazole topically daily and see if that helps. Consider addition of Gabapentin      Flushing, menopausal    She is describing intermittent episodes of what she describes as a buzzing feeling that starts low and spreads up her body. Only lasts about 30-60 seconds and is followed by some nausea. She  feels she has to sit down and close her eyes but she denies spinning, headache, visual or hearing changes, SOB, CP, palpitations, recent illness. She acknowledges a good deal of stress with work and also one of her sister's died recently of an overdose. She also notes that it started after she stopped her hormones after her GYN retired. When she went back on it stopped but has recently recurred despite the fact she is on them still. She is encouraged to increase hydration and protein intake. Try to minimize stress, monitor BP and pulse if further episodes occur and consider referral to ENT and/or cardiology if symptoms persist or worsen.          I have discontinued Eiza L. Simmers's bisoprolol and nitrofurantoin (macrocrystal-monohydrate). I am also having her start on METRONIDAZOLE (TOPICAL). Additionally, I am  having her maintain her ALPRAZolam, busPIRone, buPROPion, azelastine, albuterol, Fluticasone-Salmeterol, losartan, estradiol, progesterone, and metoprolol succinate.  Meds ordered this encounter  Medications  . METRONIDAZOLE, TOPICAL, 0.75 % LOTN    Sig: Apply 1 Dose topically daily.    Dispense:  59 mL    Refill:  3     Penni Homans, MD

## 2020-03-17 NOTE — Assessment & Plan Note (Signed)
She is describing intermittent episodes of what she describes as a buzzing feeling that starts low and spreads up her body. Only lasts about 30-60 seconds and is followed by some nausea. She feels she has to sit down and close her eyes but she denies spinning, headache, visual or hearing changes, SOB, CP, palpitations, recent illness. She acknowledges a good deal of stress with work and also one of her sister's died recently of an overdose. She also notes that it started after she stopped her hormones after her GYN retired. When she went back on it stopped but has recently recurred despite the fact she is on them still. She is encouraged to increase hydration and protein intake. Try to minimize stress, monitor BP and pulse if further episodes occur and consider referral to ENT and/or cardiology if symptoms persist or worsen.

## 2020-03-20 ENCOUNTER — Ambulatory Visit (INDEPENDENT_AMBULATORY_CARE_PROVIDER_SITE_OTHER): Payer: BC Managed Care – PPO | Admitting: Psychology

## 2020-03-20 DIAGNOSIS — F331 Major depressive disorder, recurrent, moderate: Secondary | ICD-10-CM

## 2020-03-21 ENCOUNTER — Telehealth: Payer: Self-pay | Admitting: General Practice

## 2020-03-21 NOTE — Telephone Encounter (Signed)
Called patient to schedule 3 month follow up with Dr. Nehemiah Settle.  Pt stated that she feel that she does not need to follow up at this time.  She recently had visit with PCP.  Patient advised to call to schedule if symptoms occur.

## 2020-03-31 ENCOUNTER — Encounter: Payer: Self-pay | Admitting: Family Medicine

## 2020-03-31 ENCOUNTER — Other Ambulatory Visit: Payer: Self-pay | Admitting: Family Medicine

## 2020-03-31 DIAGNOSIS — M545 Low back pain, unspecified: Secondary | ICD-10-CM

## 2020-04-01 ENCOUNTER — Ambulatory Visit (HOSPITAL_BASED_OUTPATIENT_CLINIC_OR_DEPARTMENT_OTHER)
Admission: RE | Admit: 2020-04-01 | Discharge: 2020-04-01 | Disposition: A | Discharge status: Home / Self Care | Payer: BC Managed Care – PPO | Source: Ambulatory Visit | Attending: Family Medicine | Admitting: Family Medicine

## 2020-04-01 ENCOUNTER — Other Ambulatory Visit: Payer: Self-pay

## 2020-04-01 DIAGNOSIS — M545 Low back pain, unspecified: Secondary | ICD-10-CM | POA: Insufficient documentation

## 2020-04-02 ENCOUNTER — Encounter: Payer: Self-pay | Admitting: Family Medicine

## 2020-04-03 ENCOUNTER — Ambulatory Visit (INDEPENDENT_AMBULATORY_CARE_PROVIDER_SITE_OTHER): Payer: BC Managed Care – PPO | Admitting: Psychology

## 2020-04-03 DIAGNOSIS — F331 Major depressive disorder, recurrent, moderate: Secondary | ICD-10-CM | POA: Diagnosis not present

## 2020-04-07 ENCOUNTER — Other Ambulatory Visit: Payer: Self-pay

## 2020-04-07 ENCOUNTER — Ambulatory Visit: Payer: BC Managed Care – PPO | Admitting: Family Medicine

## 2020-04-07 VITALS — BP 126/82 | Ht 64.0 in | Wt 135.0 lb

## 2020-04-07 DIAGNOSIS — M533 Sacrococcygeal disorders, not elsewhere classified: Secondary | ICD-10-CM | POA: Diagnosis not present

## 2020-04-07 NOTE — Patient Instructions (Signed)
Nice to meet you Please try to obtain the off loading pillow  Please try rub on medicine  Please try physical therapy.   Please send me a message in MyChart with any questions or updates.  Please see me back in 4 weeks.   --Dr. Raeford Razor

## 2020-04-07 NOTE — Progress Notes (Signed)
Casey Rowe - 70 y.o. female MRN [id number]  Date of birth: [birth year]  SUBJECTIVE:  Including CC & ROS.  No chief complaint on file.   Casey Rowe is a 70 y.o. female that is presenting with coccyx pain.  The pain is been ongoing for a few weeks.  Has tried medication with limited improvement.  She initially felt the pain after she was doing inverted sit-ups.  Pain is localized to this area.  No history of similar pain.  No stress surgery..  Independent review of the sacrum x-ray from 2/23 shows no acute changes.   Review of Systems See HPI   HISTORY: Past Medical, Surgical, Social, and Family History Reviewed & Updated per EMR.   Pertinent Historical Findings include:  Past Medical History:  Diagnosis Date  . Allergy   . Anxiety   . Asthma   . Broken jaw ([place]) 1980s  . Depression   . Depression with anxiety   . Hypertension   . Preventative health care 03/16/2015  . Sarcoidosis   . Tubal pregnancy 1981    Past Surgical History:  Procedure Laterality Date  . ABDOMINAL HYSTERECTOMY    . CESAREAN SECTION    . ECTOPIC PREGNANCY SURGERY  1981  . FRACTURE SURGERY  1980s   broken jaw    Family History  Problem Relation Age of Onset  . Hypertension Mother   . Dementia Mother   . Diabetes Mother   . Diabetes Father   . Heart attack Father   . Heart disease Father   . Psoriasis Father   . Cancer Sister        pancreatic  . Hepatitis B Brother   . Rashes / Skin problems Son   . Alcohol abuse Sister        substance  . Gout Brother   . Hypertension Brother   . Other Son        bad allergies, aspirin, dermatitis  . Rashes / Skin problems Son   . Cancer Maternal Aunt        breast  . Breast cancer Maternal Aunt     Social History   Socioeconomic History  . Marital status: Married    Spouse name: Not on file  . Number of children: Not on file  . Years of education: Not on file  . Highest education level: Not on file  Occupational History  . Not on  file  Tobacco Use  . Smoking status: Never Smoker  . Smokeless tobacco: Never Used  Substance and Sexual Activity  . Alcohol use: Yes  . Drug use: No  . 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 Normal strength resistance. Tenderness palpation of the coccyx. Neurovascular intact     ASSESSMENT & PLAN:   Coccydynia Imaging was reassuring but clinical exam would suggest more coxodynia in nature. -Counseled on home exercise therapy and supportive care. -Pennsaid samples. -Referral to physical therapy. -Could consider further imaging versus injection.

## 2020-04-08 DIAGNOSIS — M533 Sacrococcygeal disorders, not elsewhere classified: Secondary | ICD-10-CM | POA: Insufficient documentation

## 2020-04-08 HISTORY — DX: Sacrococcygeal disorders, not elsewhere classified: M53.3

## 2020-04-08 NOTE — Assessment & Plan Note (Signed)
Imaging was reassuring but clinical exam would suggest more coxodynia in nature. -Counseled on home exercise therapy and supportive care. -Pennsaid samples. -Referral to physical therapy. -Could consider further imaging versus injection.

## 2020-04-14 ENCOUNTER — Other Ambulatory Visit: Payer: Self-pay | Admitting: Family Medicine

## 2020-04-14 ENCOUNTER — Encounter: Payer: Self-pay | Admitting: Family Medicine

## 2020-04-14 DIAGNOSIS — M533 Sacrococcygeal disorders, not elsewhere classified: Secondary | ICD-10-CM

## 2020-04-17 ENCOUNTER — Ambulatory Visit: Payer: BC Managed Care – PPO | Admitting: Psychology

## 2020-04-25 ENCOUNTER — Ambulatory Visit: Payer: Self-pay

## 2020-04-25 ENCOUNTER — Ambulatory Visit: Payer: BC Managed Care – PPO | Admitting: Family

## 2020-04-25 ENCOUNTER — Other Ambulatory Visit: Payer: Self-pay

## 2020-04-25 DIAGNOSIS — M533 Sacrococcygeal disorders, not elsewhere classified: Secondary | ICD-10-CM

## 2020-04-25 NOTE — Progress Notes (Signed)
Subjective: She is here for ultrasound-guided sacrum/coccyx injection.  She has had tailbone pain for about 6 months since doing sit ups on a bench and having her sacrum hip the metal part of the bench.  Objective: She is point tender to the right of midline at the distal sacrum.  Procedure: Ultrasound-guided injection: After sterile prep with Betadine, injected 3 cc 0.25% bupivacaine and 6 mg betamethasone using ultrasound to find the bony landmarks and guide the needle.  She had complete relief during the immediate anesthetic phase.  Consider MRI scan if she fails to improve.

## 2020-04-29 ENCOUNTER — Encounter: Payer: Self-pay | Admitting: Family

## 2020-04-29 NOTE — Progress Notes (Signed)
Office Visit Note   Patient: Casey Rowe           Date of Birth: January 31, 1951           MRN: [id number] Visit Date: 04/25/2020              Requested by: Mosie Lukes, MD [address] [place],  [place] [address] PCP: Mosie Lukes, MD  Chief Complaint  Patient presents with  . Lower Back - Pain      HPI: The patient is a 70 year old woman who presents today complaining of coccyx pain since last October or November.  She states she was doing inverted sit ups at the gym when she bumped her tailbone on the metal bar between the cushions.  She has been having pain difficulty sitting since this this is an aching throbbing shooting pain she denies any numbness tingling weakness in her lower extremities she has been using anti-inflammatories without improvement.  She did have x-rays of her pelvis in February of this year.  These were negative for fracture.  She has seen her primary care as well as sports medicine for this they have recommended conservative measures including physical therapy the patient has declined physical therapy at this time  Assessment & Plan: Visit Diagnoses:  1. Sacral pain     Plan: discussed conservative measures. NSAIDs. Offered PT. Patient prefers to proceed with injection. Dr. Junius Roads agrees to see for this. Will consider further imaging if continued pain following injection.  Follow-Up Instructions: No follow-ups on file.   Back Exam   Muscle Strength  The patient has normal back strength.  Other  Gait: normal       Patient is alert, oriented, no adenopathy, well-dressed, normal affect, normal respiratory effort. Patient is point tender at distal sacrum.   Imaging: No results found. No images are attached to the encounter.  Labs: Lab Results  Component Value Date   LABORGA ESCHERICHIA COLI 08/01/2013     Lab Results  Component Value Date   ALBUMIN 3.9 01/24/2020   ALBUMIN 4.0 03/24/2017   ALBUMIN 4.0 05/12/2016     No results found for: MG Lab Results  Component Value Date   VD25OH 35 02/11/2014   VD25OH 33 01/29/2013    No results found for: PREALBUMIN CBC EXTENDED Latest Ref Rng & Units 01/24/2020 03/24/2017 03/26/2015  WBC 4.0 - 10.5 K/uL 4.1 5.2 5.6  RBC 3.87 - 5.11 Mil/uL 4.42 4.19 4.08  HGB 12.0 - 15.0 g/dL 14.8 14.2 13.3  HCT 36.0 - 46.0 % 44.4 42.4 39.8  PLT 150.0 - 400.0 K/uL 189.0 197.0 210.0  NEUTROABS 1.7 - 7.7 K/uL - - -  LYMPHSABS 0.7 - 4.0 K/uL - - -     There is no height or weight on file to calculate BMI.  Orders:  Orders Placed This Encounter  Procedures  . US Guided Needle Placement   No orders of the defined types were placed in this encounter.    Procedures: No procedures performed  Clinical Data: No additional findings.  ROS:  All other systems negative, except as noted in the HPI. Review of Systems  Objective: Vital Signs: LMP 02/08/1997   Specialty Comments:  No specialty comments available.  PMFS History: Patient Active Problem List   Diagnosis Date Noted  . Coccydynia 04/08/2020  . Rosacea 03/17/2020  . Flushing, menopausal 03/17/2020  . Left hip pain 01/21/2020  . Hyperlipidemia 01/17/2020  . Acid reflux 02/11/2019  .  Lipoma 07/09/2018  . Morton's neuroma of both feet 03/27/2017  . Palpitation 03/24/2017  . Preventative health care 03/16/2015  . Acute nonsuppurative otitis media of left ear 12/22/2014  . Weight loss 11/07/2013  . Asthma exacerbation 02/22/2012  . PTSD (post-traumatic stress disorder) 10/19/2011  . Atypical chest pain 02/10/2011  . Depression with anxiety   . Hypertension   . Extrinsic asthma   . Sarcoidosis   . Allergy   . Sinusitis 01/18/2011   Past Medical History:  Diagnosis Date  . Allergy   . Anxiety   . Asthma   . Broken jaw ([place]) 1980s  . Depression   . Depression with anxiety   . Hypertension   . Preventative health care 03/16/2015  . Sarcoidosis   . Tubal pregnancy 1981    Family History   Problem Relation Age of Onset  . Hypertension Mother   . Dementia Mother   . Diabetes Mother   . Diabetes Father   . Heart attack Father   . Heart disease Father   . Psoriasis Father   . Cancer Sister        pancreatic  . Hepatitis B Brother   . Rashes / Skin problems Son   . Alcohol abuse Sister        substance  . Gout Brother   . Hypertension Brother   . Other Son        bad allergies, aspirin, dermatitis  . Rashes / Skin problems Son   . Cancer Maternal Aunt        breast  . Breast cancer Maternal Aunt     Past Surgical History:  Procedure Laterality Date  . ABDOMINAL HYSTERECTOMY    . CESAREAN SECTION    . ECTOPIC PREGNANCY SURGERY  1981  . FRACTURE SURGERY  1980s   broken jaw   Social History   Occupational History  . Not on file  Tobacco Use  . Smoking status: Never Smoker  . Smokeless tobacco: Never Used  Substance and Sexual Activity  . Alcohol use: Yes  . Drug use: No  . 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

## 2020-05-01 ENCOUNTER — Ambulatory Visit (INDEPENDENT_AMBULATORY_CARE_PROVIDER_SITE_OTHER): Payer: BC Managed Care – PPO | Admitting: Psychology

## 2020-05-01 DIAGNOSIS — F331 Major depressive disorder, recurrent, moderate: Secondary | ICD-10-CM

## 2020-05-20 ENCOUNTER — Ambulatory Visit: Payer: BC Managed Care – PPO | Attending: Internal Medicine

## 2020-05-20 DIAGNOSIS — Z23 Encounter for immunization: Secondary | ICD-10-CM

## 2020-05-20 NOTE — Progress Notes (Signed)
   Covid-19 Vaccination Clinic  Name:  [name]    MRN: [id number] DOB: [birth year]  05/20/2020  [name] was observed post Covid-19 immunization for 15 minutes without incident. She was provided with Vaccine Information Sheet and instruction to access the V-Safe system.   [name] was instructed to call 911 with any severe reactions post vaccine: Marland Kitchen Difficulty breathing  . Swelling of face and throat  . A fast heartbeat  . A bad rash all over body  . Dizziness and weakness   Immunizations Administered    Name Date Dose VIS Date Route   PFIZER Comrnaty(Gray TOP) Covid-19 Vaccine 05/20/2020 11:41 AM 0.3 mL 01/17/2020 Intramuscular   Manufacturer: [place]   Lot: [id number]   NDC: [phone]

## 2020-05-26 ENCOUNTER — Other Ambulatory Visit (HOSPITAL_BASED_OUTPATIENT_CLINIC_OR_DEPARTMENT_OTHER): Payer: Self-pay

## 2020-05-26 MED ORDER — PFIZER-BIONT COVID-19 VAC-TRIS 30 MCG/0.3ML IM SUSP
INTRAMUSCULAR | 0 refills | Status: DC
  Filled 2020-05-26: qty 0.3, 1d supply, fill #0

## 2020-05-27 ENCOUNTER — Encounter: Payer: Self-pay | Admitting: Family Medicine

## 2020-06-30 ENCOUNTER — Encounter: Payer: Self-pay | Admitting: Family Medicine

## 2020-06-30 NOTE — Telephone Encounter (Signed)
Are you ok with her needed to see someone where she is now?

## 2020-07-04 ENCOUNTER — Telehealth: Payer: Self-pay

## 2020-07-04 NOTE — Telephone Encounter (Signed)
Called pt to set up for Tuesday evening

## 2020-07-06 ENCOUNTER — Encounter: Payer: Self-pay | Admitting: Family Medicine

## 2020-07-08 NOTE — Telephone Encounter (Signed)
Patient called in and declined appt and hung up . Stating she only wanted labs.

## 2020-07-14 ENCOUNTER — Other Ambulatory Visit: Payer: Self-pay | Admitting: Family Medicine

## 2020-07-29 ENCOUNTER — Other Ambulatory Visit: Payer: BC Managed Care – PPO

## 2020-08-03 ENCOUNTER — Encounter: Payer: Self-pay | Admitting: Family Medicine

## 2020-08-05 ENCOUNTER — Telehealth (INDEPENDENT_AMBULATORY_CARE_PROVIDER_SITE_OTHER): Payer: BC Managed Care – PPO | Admitting: Family Medicine

## 2020-08-05 ENCOUNTER — Other Ambulatory Visit (INDEPENDENT_AMBULATORY_CARE_PROVIDER_SITE_OTHER): Payer: BC Managed Care – PPO

## 2020-08-05 VITALS — BP 146/78 | HR 63 | Temp 97.6°F | Wt 126.2 lb

## 2020-08-05 DIAGNOSIS — I10 Essential (primary) hypertension: Secondary | ICD-10-CM

## 2020-08-05 DIAGNOSIS — R197 Diarrhea, unspecified: Secondary | ICD-10-CM

## 2020-08-05 DIAGNOSIS — R002 Palpitations: Secondary | ICD-10-CM | POA: Diagnosis not present

## 2020-08-05 DIAGNOSIS — R109 Unspecified abdominal pain: Secondary | ICD-10-CM

## 2020-08-05 DIAGNOSIS — K219 Gastro-esophageal reflux disease without esophagitis: Secondary | ICD-10-CM

## 2020-08-05 MED ORDER — METOPROLOL SUCCINATE ER 50 MG PO TB24: 50.0000 mg | ORAL_TABLET | Freq: Every day | ORAL | 1 refills | Status: DC

## 2020-08-05 NOTE — Patient Instructions (Signed)

## 2020-08-05 NOTE — Assessment & Plan Note (Addendum)
Well controlled, no changes to meds. Encouraged heart healthy diet such as the DASH diet and exercise as tolerated. Metoprolol XR 50 mg presciption given

## 2020-08-06 ENCOUNTER — Other Ambulatory Visit: Payer: BC Managed Care – PPO

## 2020-08-06 ENCOUNTER — Other Ambulatory Visit: Payer: Self-pay

## 2020-08-06 ENCOUNTER — Ambulatory Visit: Payer: BC Managed Care – PPO | Admitting: Family

## 2020-08-06 DIAGNOSIS — R109 Unspecified abdominal pain: Secondary | ICD-10-CM

## 2020-08-06 LAB — CBC WITH DIFFERENTIAL/PLATELET
Basophils Absolute: 0.1 10*3/uL (ref 0.0–0.1)
Basophils Relative: 1.3 % (ref 0.0–3.0)
Eosinophils Absolute: 0.2 10*3/uL (ref 0.0–0.7)
Eosinophils Relative: 4.7 % (ref 0.0–5.0)
HCT: 37.5 % (ref 36.0–46.0)
Hemoglobin: 12.9 g/dL (ref 12.0–15.0)
Lymphocytes Relative: 21.7 % (ref 12.0–46.0)
Lymphs Abs: 1 10*3/uL (ref 0.7–4.0)
MCHC: 34.3 g/dL (ref 30.0–36.0)
MCV: 100.5 fl — ABNORMAL HIGH (ref 78.0–100.0)
Monocytes Absolute: 0.5 10*3/uL (ref 0.1–1.0)
Monocytes Relative: 9.6 % (ref 3.0–12.0)
Neutro Abs: 3 10*3/uL (ref 1.4–7.7)
Neutrophils Relative %: 62.7 % (ref 43.0–77.0)
Platelets: 171 10*3/uL (ref 150.0–400.0)
RBC: 3.73 Mil/uL — ABNORMAL LOW (ref 3.87–5.11)
RDW: 12.4 % (ref 11.5–15.5)
WBC: 4.8 10*3/uL (ref 4.0–10.5)

## 2020-08-06 LAB — COMPREHENSIVE METABOLIC PANEL
ALT: 18 U/L (ref 0–35)
AST: 27 U/L (ref 0–37)
Albumin: 3.9 g/dL (ref 3.5–5.2)
Alkaline Phosphatase: 40 U/L (ref 39–117)
BUN: 11 mg/dL (ref 6–23)
CO2: 28 mEq/L (ref 19–32)
Calcium: 9.3 mg/dL (ref 8.4–10.5)
Chloride: 102 mEq/L (ref 96–112)
Creatinine, Ser: 0.7 mg/dL (ref 0.40–1.20)
GFR: 89.17 mL/min (ref >60.00)
Glucose, Bld: 96 mg/dL (ref 70–99)
Potassium: 4.1 mEq/L (ref 3.5–5.1)
Sodium: 137 mEq/L (ref 135–145)
Total Bilirubin: 0.6 mg/dL (ref 0.2–1.2)
Total Protein: 6.2 g/dL (ref 6.0–8.3)

## 2020-08-06 LAB — AMYLASE: Amylase: 16 U/L — ABNORMAL LOW (ref 27–131)

## 2020-08-06 LAB — LIPASE: Lipase: 23 U/L (ref 11.0–59.0)

## 2020-08-08 DIAGNOSIS — R109 Unspecified abdominal pain: Secondary | ICD-10-CM | POA: Insufficient documentation

## 2020-08-08 HISTORY — DX: Unspecified abdominal pain: R10.9

## 2020-08-08 LAB — FECAL LACTOFERRIN, QUANT
Fecal Lactoferrin: NEGATIVE
MICRO NUMBER:: 12064969
SPECIMEN QUALITY:: ADEQUATE

## 2020-08-08 LAB — CLOSTRIDIUM DIFFICILE BY PCR: Toxigenic C. Difficile by PCR: NEGATIVE

## 2020-08-08 NOTE — Assessment & Plan Note (Signed)
Has alternated between constipation and diarrhea with intermittent abdominal pain. No fevers or chills or nausea or vomiting. Maintain  A bland BRAT diet and check lebs

## 2020-08-08 NOTE — Progress Notes (Signed)
MyChart Video Visit    Virtual Visit via Video Note   This visit type was conducted due to national recommendations for restrictions regarding the COVID-19 Pandemic (e.g. social distancing) in an effort to limit this patient's exposure and mitigate transmission in our community. This patient is at least at moderate risk for complications without adequate follow up. This format is felt to be most appropriate for this patient at this time. Physical exam was limited by quality of the video and audio technology used for the visit. S Chism, CMA was able to get the patient set up on a video visit.  Patient location: home, Patient and provider in visit Provider location: Office  I discussed the limitations of evaluation and management by telemedicine and the availability of in person appointments. The patient expressed understanding and agreed to proceed.  Visit Date: 08/05/2020  Today's healthcare provider: Penni Homans, MD     Subjective:    Patient ID: Casey Rowe, female    DOB: [birth year], 70 y.o.   MRN: [id number]  Chief Complaint  Patient presents with   Diarrhea    Pt states that she been had this problem since April 18 th.     HPI Patient is in today for reevaluation of diarrhea. She has been struggling with diarrhea for quite some time and is noting numerous loose stool daily at times followed by episodes of constipation lasting days. No bloody or tarry stool. No fevers or chills, no nausea or vomiting. She fills up quickly and does not get notably hungry Denies CP/palp/SOB/HA/congestion/fevers or GU c/o. Taking meds as prescribed   Past Medical History:  Diagnosis Date   Allergy    Anxiety    Asthma    Broken jaw ([place]) 1980s   Depression    Depression with anxiety    Hypertension    Preventative health care 03/16/2015   Sarcoidosis    Tubal pregnancy 1981    Past Surgical History:  Procedure Laterality Date   ABDOMINAL HYSTERECTOMY     [place]   broken jaw    Family History  Problem Relation Age of Onset   Hypertension Mother    Dementia Mother    Diabetes Mother    Diabetes Father    Heart attack Father    Heart disease Father    Psoriasis Father    Cancer Sister        pancreatic   Hepatitis B Brother    Rashes / Skin problems Son    Alcohol abuse Sister        substance   Gout Brother    Hypertension Brother    Other Son        bad allergies, aspirin, dermatitis   Rashes / Skin problems Son    Cancer Maternal Aunt        breast   Breast cancer Maternal Aunt     Social History   Socioeconomic History   Marital status: Married    Spouse name: Not on file   Number of children: Not on file   Years of education: Not on file   Highest education level: Not on file  Occupational History   Not on file  Tobacco Use   Smoking status: Never   Smokeless tobacco: Never  Substance and Sexual Activity   Alcohol use: Yes   Drug use: No   Sexual activity: Yes  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[employer], lives with husband, no dietary, minimal red meat  Other Topics Concern   Not on file  Social History Narrative   Works as a Pharmacist, 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   Married   Grown children- 3         Social Determinants of Radio broadcast assistant Strain: Not on file  Food Insecurity: Not on file  Transportation Needs: Not on file  Physical Activity: Not on file  Stress: Not on file  Social Connections: Not on file  Intimate Partner Violence: Not on file    Outpatient Medications Prior to Visit  Medication Sig Dispense Refill   albuterol (VENTOLIN HFA) 108 (90 Base) MCG/ACT inhaler INHALE 2 PUFFS INTO THE LUNGS EVERY 6 HOURS AS NEEDED FOR WHEEZING 18 g 5   ALPRAZolam (XANAX) 0.25 MG tablet Take 0.25 mg by mouth daily.      azelastine (ASTELIN) 0.1 % nasal spray Place 2 sprays into both nostrils 2 (two) times daily. Use in each nostril as directed 30 mL 12    buPROPion (WELLBUTRIN XL) 300 MG 24 hr tablet Take 300 mg by mouth daily.     busPIRone (BUSPAR) 5 MG tablet Take 5 mg by mouth 3 (three) times daily.      estradiol (ESTRACE) 1 MG tablet TAKE 1 TABLET BY MOUTH DAILY 90 tablet 1   Fluticasone-Salmeterol (ADVAIR) 100-50 MCG/DOSE AEPB INHALE 1 PUFF INTO THE LUNGS 2 (TWO) TIMES DAILY. 60 each 5   losartan (COZAAR) 50 MG tablet TAKE ONE TABLET BY MOUTH DAILY 90 tablet 1   METRONIDAZOLE, TOPICAL, 0.75 % LOTN Apply 1 Dose topically daily. 59 mL 3   progesterone (PROMETRIUM) 100 MG capsule TAKE 1 CAPSULE BY MOUTH DAILY 90 capsule 1   metoprolol succinate (TOPROL-XL) 50 MG 24 hr tablet Take 1 tablet (50 mg total) by mouth daily. Take with or immediately following a meal 90 tablet 1   COVID-19 mRNA Vac-TriS, Pfizer, (PFIZER-BIONT COVID-19 VAC-TRIS) SUSP injection Inject into the muscle. 0.3 mL 0   No facility-administered medications prior to visit.    Allergies  Allergen Reactions   Demerol Nausea And Vomiting   Ibuprofen Other (See Comments)    bruises   Singulair [Montelukast]     Dry mouth/dehydration   Augmentin [Amoxicillin-Pot Clavulanate] Diarrhea    Caused Diarrhea    Review of Systems  Constitutional:  Positive for malaise/fatigue. Negative for fever.  HENT:  Negative for congestion.   Eyes:  Negative for blurred vision.  Respiratory:  Negative for shortness of breath.   Cardiovascular:  Negative for chest pain, palpitations and leg swelling.  Gastrointestinal:  Positive for abdominal pain, constipation and diarrhea. Negative for blood in stool, nausea and vomiting.  Genitourinary:  Negative for dysuria and frequency.  Musculoskeletal:  Negative for falls.  Skin:  Negative for rash.  Neurological:  Negative for dizziness, loss of consciousness and headaches.  Endo/Heme/Allergies:  Negative for environmental allergies.  Psychiatric/Behavioral:  Negative for depression. The patient is not nervous/anxious.       Objective:     Physical Exam Constitutional:      General: She is not in acute distress.    Appearance: Normal appearance. She is not ill-appearing or toxic-appearing.  HENT:     Head: Normocephalic and atraumatic.     Right Ear: External ear normal.     Left Ear: External ear normal.     Nose: Nose normal.  Eyes:     General:  Right eye: No discharge.        Left eye: No discharge.  Pulmonary:     Effort: Pulmonary effort is normal.  Skin:    Findings: No rash.  Neurological:     Mental Status: She is alert and oriented to person, place, and time.  Psychiatric:        Behavior: Behavior normal.    BP (!) 146/78   Pulse 63   Temp 97.6 F (36.4 C)   Wt 126 lb 3.2 oz (57.2 kg)   LMP 02/08/1997   BMI 21.66 kg/m  Wt Readings from Last 3 Encounters:  08/05/20 126 lb 3.2 oz (57.2 kg)  04/07/20 135 lb (61.2 kg)  03/17/20 137 lb 9.6 oz (62.4 kg)    Diabetic Foot Exam - Simple   No data filed    Lab Results  Component Value Date   WBC 4.8 08/05/2020   HGB 12.9 08/05/2020   HCT 37.5 08/05/2020   PLT 171.0 08/05/2020   GLUCOSE 96 08/05/2020   CHOL 172 01/24/2020   TRIG 161.0 (H) 01/24/2020   HDL 35.80 (L) 01/24/2020   LDLCALC 104 (H) 01/24/2020   ALT 18 08/05/2020   AST 27 08/05/2020   NA 137 08/05/2020   K 4.1 08/05/2020   CL 102 08/05/2020   CREATININE 0.70 08/05/2020   BUN 11 08/05/2020   CO2 28 08/05/2020   TSH 1.22 01/24/2020    Lab Results  Component Value Date   TSH 1.22 01/24/2020   Lab Results  Component Value Date   WBC 4.8 08/05/2020   HGB 12.9 08/05/2020   HCT 37.5 08/05/2020   MCV 100.5 (H) 08/05/2020   PLT 171.0 08/05/2020   Lab Results  Component Value Date   NA 137 08/05/2020   K 4.1 08/05/2020   CO2 28 08/05/2020   GLUCOSE 96 08/05/2020   BUN 11 08/05/2020   CREATININE 0.70 08/05/2020   BILITOT 0.6 08/05/2020   ALKPHOS 40 08/05/2020   AST 27 08/05/2020   ALT 18 08/05/2020   PROT 6.2 08/05/2020   ALBUMIN 3.9 08/05/2020   CALCIUM  9.3 08/05/2020   GFR 89.17 08/05/2020   Lab Results  Component Value Date   CHOL 172 01/24/2020   Lab Results  Component Value Date   HDL 35.80 (L) 01/24/2020   Lab Results  Component Value Date   LDLCALC 104 (H) 01/24/2020   Lab Results  Component Value Date   TRIG 161.0 (H) 01/24/2020   Lab Results  Component Value Date   CHOLHDL 5 01/24/2020   No results found for: HGBA1C     Assessment & Plan:   Problem List Items Addressed This Visit     Diarrhea - Primary   Relevant Orders   Amylase (Completed)   CBC with Differential/Platelet (Completed)   Comprehensive metabolic panel (Completed)   Lipase (Completed)   Hypertension    Well controlled, no changes to meds. Encouraged heart healthy diet such as the DASH diet and exercise as tolerated. Metoprolol XR 50 mg presciption given       Relevant Medications   metoprolol succinate (TOPROL-XL) 50 MG 24 hr tablet   Palpitation    Improved on beta blockers.        Acid reflux    Avoid offending foods, start probiotics. Do not eat large meals in late evening and consider raising head of bed.        Abdominal pain    Has alternated between constipation and diarrhea with  intermittent abdominal pain. No fevers or chills or nausea or vomiting. Maintain  A bland BRAT diet and check lebs       Relevant Orders   Amylase (Completed)   Clostridium Difficile by PCR([facility]) (Completed)   Lipase (Completed)   Ova and parasite examination   Stool Culture (Completed)   Stool, WBC/Lactoferrin (Completed)    I am having Stefanee L. Weyer maintain her ALPRAZolam, busPIRone, buPROPion, azelastine, albuterol, Fluticasone-Salmeterol, METRONIDAZOLE (TOPICAL), losartan, Pfizer-BioNT COVID-19 Vac-TriS, progesterone, estradiol, and metoprolol succinate.  Meds ordered this encounter  Medications   metoprolol succinate (TOPROL-XL) 50 MG 24 hr tablet    Sig: Take 1 tablet (50 mg total) by mouth daily. Take with or  immediately following a meal    Dispense:  90 tablet    Refill:  1    I discussed the assessment and treatment plan with the patient. The patient was provided an opportunity to ask questions and all were answered. The patient agreed with the plan and demonstrated an understanding of the instructions.   The patient was advised to call back or seek an in-person evaluation if the symptoms worsen or if the condition fails to improve as anticipated.  I provided 30 minutes of face-to-face time during this encounter.   Penni Homans, MD [facility] [phone] (phone) [phone] (fax)  [place]

## 2020-08-08 NOTE — Assessment & Plan Note (Signed)
Avoid offending foods, start probiotics. Do not eat large meals in late evening and consider raising head of bed.  

## 2020-08-08 NOTE — Assessment & Plan Note (Signed)
Improved on beta blockers.

## 2020-08-10 LAB — STOOL CULTURE: E coli, Shiga toxin Assay: NEGATIVE

## 2020-08-13 LAB — OVA AND PARASITE EXAMINATION
CONCENTRATE RESULT:: NONE SEEN
MICRO NUMBER:: 12064738
SPECIMEN QUALITY:: ADEQUATE
TRICHROME RESULT:: NONE SEEN

## 2020-08-14 ENCOUNTER — Other Ambulatory Visit (HOSPITAL_BASED_OUTPATIENT_CLINIC_OR_DEPARTMENT_OTHER): Payer: Self-pay

## 2020-08-14 ENCOUNTER — Ambulatory Visit (INDEPENDENT_AMBULATORY_CARE_PROVIDER_SITE_OTHER): Payer: BC Managed Care – PPO | Admitting: Family Medicine

## 2020-08-14 ENCOUNTER — Other Ambulatory Visit: Payer: Self-pay

## 2020-08-14 ENCOUNTER — Encounter: Payer: Self-pay | Admitting: Family Medicine

## 2020-08-14 VITALS — BP 112/74 | HR 72 | Temp 98.5°F | Resp 16 | Ht 64.0 in | Wt 125.0 lb

## 2020-08-14 DIAGNOSIS — E785 Hyperlipidemia, unspecified: Secondary | ICD-10-CM

## 2020-08-14 DIAGNOSIS — R197 Diarrhea, unspecified: Secondary | ICD-10-CM

## 2020-08-14 DIAGNOSIS — Z780 Asymptomatic menopausal state: Secondary | ICD-10-CM

## 2020-08-14 DIAGNOSIS — M25552 Pain in left hip: Secondary | ICD-10-CM

## 2020-08-14 DIAGNOSIS — Z0000 Encounter for general adult medical examination without abnormal findings: Secondary | ICD-10-CM | POA: Diagnosis not present

## 2020-08-14 DIAGNOSIS — F418 Other specified anxiety disorders: Secondary | ICD-10-CM

## 2020-08-14 DIAGNOSIS — K219 Gastro-esophageal reflux disease without esophagitis: Secondary | ICD-10-CM | POA: Diagnosis not present

## 2020-08-14 DIAGNOSIS — Z23 Encounter for immunization: Secondary | ICD-10-CM

## 2020-08-14 DIAGNOSIS — I10 Essential (primary) hypertension: Secondary | ICD-10-CM

## 2020-08-14 DIAGNOSIS — E2839 Other primary ovarian failure: Secondary | ICD-10-CM

## 2020-08-14 DIAGNOSIS — R634 Abnormal weight loss: Secondary | ICD-10-CM

## 2020-08-14 MED ORDER — TETANUS-DIPHTH-ACELL PERTUSSIS 5-2.5-18.5 LF-MCG/0.5 IM SUSY
PREFILLED_SYRINGE | INTRAMUSCULAR | 0 refills | Status: DC
  Filled 2020-08-14: qty 0.5, 1d supply, fill #0

## 2020-08-14 MED ORDER — ZOSTER VAC RECOMB ADJUVANTED 50 MCG/0.5ML IM SUSR
INTRAMUSCULAR | 0 refills | Status: DC
  Filled 2020-08-14: qty 1, 1d supply, fill #0

## 2020-08-14 NOTE — Patient Instructions (Signed)
Shingrix is the new shingles shot, 2 shots over 2-6 months, confirm coverage with insurance and document, then can return here for shots with nurse appt or at pharmacy can take in 2 weeks   Preventive Care 68 Years and Older, Female Preventive care refers to lifestyle choices and visits with your health care provider that can promote health and wellness. This includes: A yearly physical exam. This is also called an annual wellness visit. Regular dental and eye exams. Immunizations. Screening for certain conditions. Healthy lifestyle choices, such as: Eating a healthy diet. Getting regular exercise. Not using drugs or products that contain nicotine and tobacco. Limiting alcohol use. What can I expect for my preventive care visit? Physical exam Your health care provider will check your: Height and weight. These may be used to calculate your BMI (body mass index). BMI is a measurement that tells if you are at a healthy weight. Heart rate and blood pressure. Body temperature. Skin for abnormal spots. Counseling Your health care provider may ask you questions about your: Past medical problems. Family's medical history. Alcohol, tobacco, and drug use. Emotional well-being. Home life and relationship well-being. Sexual activity. Diet, exercise, and sleep habits. History of falls. Memory and ability to understand (cognition). Work and work Statistician. Pregnancy and menstrual history. Access to firearms. What immunizations do I need?  Vaccines are usually given at various ages, according to a schedule. Your health care provider will recommend vaccines for you based on your age, medicalhistory, and lifestyle or other factors, such as travel or where you work. What tests do I need? Blood tests Lipid and cholesterol levels. These may be checked every 5 years, or more often depending on your overall health. Hepatitis C test. Hepatitis B test. Screening Lung cancer screening. You may  have this screening every year starting at age 68 if you have a 30-pack-year history of smoking and currently smoke or have quit within the past 15 years. Colorectal cancer screening. All adults should have this screening starting at age 68 and continuing until age 68. Your health care provider may recommend screening at age 24 if you are at increased risk. You will have tests every 1-10 years, depending on your results and the type of screening test. Diabetes screening. This is done by checking your blood sugar (glucose) after you have not eaten for a while (fasting). You may have this done every 1-3 years. Mammogram. This may be done every 1-2 years. Talk with your health care provider about how often you should have regular mammograms. Abdominal aortic aneurysm (AAA) screening. You may need this if you are a current or former smoker. BRCA-related cancer screening. This may be done if you have a family history of breast, ovarian, tubal, or peritoneal cancers. Other tests STD (sexually transmitted disease) testing, if you are at risk. Bone density scan. This is done to screen for osteoporosis. You may have this done starting at age 68. Talk with your health care provider about your test results, treatment options,and if necessary, the need for more tests. Follow these instructions at home: Eating and drinking  Eat a diet that includes fresh fruits and vegetables, whole grains, lean protein, and low-fat dairy products. Limit your intake of foods with high amounts of sugar, saturated fats, and salt. Take vitamin and mineral supplements as recommended by your health care provider. Do not drink alcohol if your health care provider tells you not to drink. If you drink alcohol: Limit how much you have to 0-1 drink a  day. Be aware of how much alcohol is in your drink. In the [place] one drink equals one 12 oz bottle of beer (355 mL), one 5 oz glass of wine (148 mL), or one 1 oz glass of hard liquor  (44 mL).  Lifestyle Take daily care of your teeth and gums. Brush your teeth every morning and night with fluoride toothpaste. Floss one time each day. Stay active. Exercise for at least 30 minutes 5 or more days each week. Do not use any products that contain nicotine or tobacco, such as cigarettes, e-cigarettes, and chewing tobacco. If you need help quitting, ask your health care provider. Do not use drugs. If you are sexually active, practice safe sex. Use a condom or other form of protection in order to prevent STIs (sexually transmitted infections). Talk with your health care provider about taking a low-dose aspirin or statin. Find healthy ways to cope with stress, such as: Meditation, yoga, or listening to music. Journaling. Talking to a trusted person. Spending time with friends and family. Safety Always wear your seat belt while driving or riding in a vehicle. Do not drive: If you have been drinking alcohol. Do not ride with someone who has been drinking. When you are tired or distracted. While texting. Wear a helmet and other protective equipment during sports activities. If you have firearms in your house, make sure you follow all gun safety procedures. What's next? Visit your health care provider once a year for an annual wellness visit. Ask your health care provider how often you should have your eyes and teeth checked. Stay up to date on all vaccines. This information is not intended to replace advice given to you by your health care provider. Make sure you discuss any questions you have with your healthcare provider. Document Revised: 01/16/2020 Document Reviewed: 01/19/2018 Elsevier Patient Education  2022 [employer].

## 2020-08-14 NOTE — Progress Notes (Signed)
Patient ID: Casey Rowe, female    DOB: 1950/05/28  Age: 70 y.o. MRN: [id number]    Subjective:  Subjective  HPI Casey Rowe presents for office visit today for comprehensive physical exam today and follow up on management of chronic concerns. She reports that her diarrhea has started on 05/26/20. She states that the past week has been steady, but she still experiences cramping. She describes her stool as little palates that are orange in color. She states that her diarrhea comes and goes. Has about 4 BM's of diarrhea. She experiences bloating and pressure sensations when having BM's. She tolerates eggs, orange juice, rice krispies, and beyond burgers. She denies having diet drinks and states that she tolerates salads without dressings. She has an appointment with a gastroenterologist on the 12th of July. She endorses taking Florastor daily probiotic. She reports that her weight at home is around 123.6 lbs. She only has rice krispies in the morning. She has not had a colonoscopy and her last cologuard was in 2017.  She denies having any fevers, chills, or vomiting, but still feeling nausea from last week. She still has "rushes" then starts feeling sick that occurs once everyday that started a couple of days ago.  She denies CP/palp/SOB/HA/congestion/fevers or GU c/o. She states that she does not take her Buspar 5 MG and Wellbutrin 300 MG as regularly as she should.  Review of Systems  Constitutional:  Negative for chills, fatigue and fever.  HENT:  Negative for congestion, rhinorrhea, sinus pressure, sinus pain, sore throat and trouble swallowing.   Eyes:  Negative for pain.  Respiratory:  Negative for cough and shortness of breath.   Cardiovascular:  Negative for chest pain, palpitations and leg swelling.  Gastrointestinal:  Positive for diarrhea. Negative for abdominal pain, blood in stool, nausea and vomiting.  Genitourinary:  Negative for decreased urine volume, difficulty urinating,  dysuria, flank pain, frequency, urgency, vaginal bleeding and vaginal discharge.  Musculoskeletal:  Negative for back pain.  Skin: Negative.   Neurological:  Negative for headaches.   History Past Medical History:  Diagnosis Date   Allergy    Anxiety    Asthma    Broken jaw ([place]) 1980s   Depression    Depression with anxiety    Hypertension    Preventative health care 03/16/2015   Sarcoidosis    Tubal pregnancy 1981    She has a past surgical history that includes Fracture surgery (1980s); Ectopic pregnancy surgery (1981); Cesarean section; and Abdominal hysterectomy.   Her family history includes Alcohol abuse in her sister; Breast cancer in her maternal aunt; Cancer in her maternal aunt and sister; Dementia in her mother; Diabetes in her father and mother; Gout in her brother; Heart attack in her father; Heart disease in her father; Hepatitis B in her brother; Hypertension in her brother and mother; Other in her son; Psoriasis in her father; Rashes / Skin problems in her son and son.She reports that she has never smoked. She has never used smokeless tobacco. She reports current alcohol use. She reports that she does not use drugs.  Current Outpatient Medications on File Prior to Visit  Medication Sig Dispense Refill   albuterol (VENTOLIN HFA) 108 (90 Base) MCG/ACT inhaler INHALE 2 PUFFS INTO THE LUNGS EVERY 6 HOURS AS NEEDED FOR WHEEZING 18 g 5   ALPRAZolam (XANAX) 0.25 MG tablet Take 0.25 mg by mouth daily.      azelastine (ASTELIN) 0.1 % nasal spray Place 2 sprays into both  nostrils 2 (two) times daily. Use in each nostril as directed 30 mL 12   buPROPion (WELLBUTRIN XL) 300 MG 24 hr tablet Take 300 mg by mouth daily.     busPIRone (BUSPAR) 5 MG tablet Take 5 mg by mouth 3 (three) times daily.      estradiol (ESTRACE) 1 MG tablet TAKE 1 TABLET BY MOUTH DAILY 90 tablet 1   Fluticasone-Salmeterol (ADVAIR) 100-50 MCG/DOSE AEPB INHALE 1 PUFF INTO THE LUNGS 2 (TWO) TIMES DAILY. 60 each 5    losartan (COZAAR) 50 MG tablet TAKE ONE TABLET BY MOUTH DAILY 90 tablet 1   metoprolol succinate (TOPROL-XL) 50 MG 24 hr tablet Take 1 tablet (50 mg total) by mouth daily. Take with or immediately following a meal 90 tablet 1   METRONIDAZOLE, TOPICAL, 0.75 % LOTN Apply 1 Dose topically daily. 59 mL 3   progesterone (PROMETRIUM) 100 MG capsule TAKE 1 CAPSULE BY MOUTH DAILY 90 capsule 1   saccharomyces boulardii (FLORASTOR) 250 MG capsule Take 250 mg by mouth daily.     No current facility-administered medications on file prior to visit.     Objective:  Objective  Physical Exam Constitutional:      General: She is not in acute distress.    Appearance: Normal appearance. She is not ill-appearing or toxic-appearing.  HENT:     Head: Normocephalic and atraumatic.     Right Ear: Tympanic membrane, ear canal and external ear normal.     Left Ear: Tympanic membrane, ear canal and external ear normal.     Nose: No congestion or rhinorrhea.  Eyes:     General: No visual field deficit.    Extraocular Movements: Extraocular movements intact.     Right eye: No nystagmus.     Left eye: No nystagmus.     Pupils: Pupils are equal, round, and reactive to light.  Cardiovascular:     Rate and Rhythm: Normal rate and regular rhythm.     Pulses: Normal pulses.     Heart sounds: Normal heart sounds. No murmur heard. Pulmonary:     Effort: Pulmonary effort is normal. No respiratory distress.     Breath sounds: Normal breath sounds. No wheezing, rhonchi or rales.  Abdominal:     General: Bowel sounds are normal.     Palpations: Abdomen is soft. There is no mass.     Tenderness: no abdominal tenderness There is no guarding.     Hernia: No hernia is present.  Musculoskeletal:        General: Normal range of motion.     Cervical back: Normal range of motion and neck supple.  Skin:    General: Skin is warm and dry.  Neurological:     Mental Status: She is alert and oriented to person, place, and  time.     Cranial Nerves: No facial asymmetry.     Motor: Motor function is intact. No weakness or tremor.  Psychiatric:        Behavior: Behavior normal.   BP 112/74   Pulse 72   Temp 98.5 F (36.9 C)   Resp 16   Ht 5\' 4"  (1.626 m)   Wt 125 lb (56.7 kg)   LMP 02/08/1997   SpO2 99%   BMI 21.46 kg/m  Wt Readings from Last 3 Encounters:  08/14/20 125 lb (56.7 kg)  08/05/20 126 lb 3.2 oz (57.2 kg)  04/07/20 135 lb (61.2 kg)     Lab Results  Component Value Date  WBC 3.9 (L) 08/14/2020   HGB 12.7 08/14/2020   HCT 38.6 08/14/2020   PLT 167.0 08/14/2020   GLUCOSE 91 08/14/2020   CHOL 158 08/14/2020   TRIG 351.0 (H) 08/14/2020   HDL 46.60 08/14/2020   LDLDIRECT 57.0 08/14/2020   LDLCALC 104 (H) 01/24/2020   ALT 14 08/14/2020   AST 20 08/14/2020   NA 140 08/14/2020   K 4.1 08/14/2020   CL 104 08/14/2020   CREATININE 0.71 08/14/2020   BUN 11 08/14/2020   CO2 26 08/14/2020   TSH 1.15 08/14/2020    DG Sacrum/Coccyx  Result Date: 04/02/2020 CLINICAL DATA:  Tailbone pain after fall in November. EXAM: SACRUM AND COCCYX - 2+ VIEW COMPARISON:  Sacrum and coccyx radiographs 01/01/2017 FINDINGS: Cortical margins of the sacrum and coccyx are intact. There is no evidence of fracture or other focal bone lesions. The sacral ala are maintained. Sacroiliac joints are congruent. Again seen enlarged right transverse processes of lower most lumbar vertebra with pseudoarticulation with the sacrum. IMPRESSION: No acute fracture of the sacrum or coccyx. Electronically Signed   By: Keith Rake M.D.   On: 04/02/2020 14:46     Assessment & Plan:  Plan    No orders of the defined types were placed in this encounter.   Problem List Items Addressed This Visit     Diarrhea    Recent testing negative for any infectious cause. She has an appointment with gastroenterology next week and she agrees to keep it. Avoid offending foods and hydrate well        Relevant Orders    Sedimentation rate (Completed)   Amylase (Completed)   Lipase (Completed)   Depression with anxiety    She follows with psychiatry, Dr Pauline Good        Hypertension     Denies CP/palp/SOB/HA/congestion/fevers/GI or GU c/o. Taking meds as prescribed           Relevant Orders   CBC (Completed)   Comprehensive metabolic panel (Completed)   Lipid panel (Completed)   TSH (Completed)   Weight loss    Encouraged to increase protein intake and monitor       Preventative health care    Patient encouraged to maintain heart healthy diet, regular exercise, adequate sleep. Consider daily probiotics. Take medications as prescribed   Dexa scan ordered. Tdap given. Labs ordered and reviewed. She sees gastroenterology next week.       Relevant Orders   CBC (Completed)   Acid reflux     Avoid offending foods, start probiotics. Do not eat large meals in late evening and consider raising head of bed.            Relevant Medications   saccharomyces boulardii (FLORASTOR) 250 MG capsule   Hyperlipidemia    Encourage heart healthy diet such as MIND or DASH diet, increase exercise, avoid trans fats, simple carbohydrates and processed foods, consider a krill or fish or flaxseed oil cap daily.        Left hip pain   Other Visit Diagnoses     Post-menopausal    -  Primary   Relevant Orders   DG Bone Density   Estrogen deficiency       Relevant Orders   DG Bone Density   Need for Tdap vaccination       Relevant Orders   Tdap vaccine greater than or equal to 7yo IM (Completed)       Follow-up: Return in about 3 months (around  11/14/2020).  I, Suezanne Jacquet, acting as a scribe for Penni Homans, MD, have documented all relevent documentation on behalf of Penni Homans, MD, as directed by Penni Homans, MD while in the presence of Penni Homans, MD.  I, Mosie Lukes, MD personally performed the services described in this documentation. All medical record entries made by the scribe were at  my direction and in my presence. I have reviewed the chart and agree that the record reflects my personal performance and is accurate and complete

## 2020-08-15 ENCOUNTER — Telehealth: Payer: Self-pay | Admitting: *Deleted

## 2020-08-15 ENCOUNTER — Other Ambulatory Visit (HOSPITAL_BASED_OUTPATIENT_CLINIC_OR_DEPARTMENT_OTHER): Payer: Self-pay

## 2020-08-15 ENCOUNTER — Other Ambulatory Visit (INDEPENDENT_AMBULATORY_CARE_PROVIDER_SITE_OTHER): Payer: BC Managed Care – PPO

## 2020-08-15 DIAGNOSIS — D7589 Other specified diseases of blood and blood-forming organs: Secondary | ICD-10-CM

## 2020-08-15 LAB — LDL CHOLESTEROL, DIRECT: Direct LDL: 57 mg/dL

## 2020-08-15 LAB — AMYLASE: Amylase: 19 U/L — ABNORMAL LOW (ref 27–131)

## 2020-08-15 LAB — CBC
HCT: 38.6 % (ref 36.0–46.0)
Hemoglobin: 12.7 g/dL (ref 12.0–15.0)
MCHC: 32.8 g/dL (ref 30.0–36.0)
MCV: 101.7 fl — ABNORMAL HIGH (ref 78.0–100.0)
Platelets: 167 10*3/uL (ref 150.0–400.0)
RBC: 3.79 Mil/uL — ABNORMAL LOW (ref 3.87–5.11)
RDW: 12.5 % (ref 11.5–15.5)
WBC: 3.9 10*3/uL — ABNORMAL LOW (ref 4.0–10.5)

## 2020-08-15 LAB — LIPID PANEL
Cholesterol: 158 mg/dL (ref 0–200)
HDL: 46.6 mg/dL (ref >39.00)
NonHDL: 111.05
Total CHOL/HDL Ratio: 3
Triglycerides: 351 mg/dL — ABNORMAL HIGH (ref 0.0–149.0)
VLDL: 70.2 mg/dL — ABNORMAL HIGH (ref 0.0–40.0)

## 2020-08-15 LAB — COMPREHENSIVE METABOLIC PANEL
ALT: 14 U/L (ref 0–35)
AST: 20 U/L (ref 0–37)
Albumin: 4 g/dL (ref 3.5–5.2)
Alkaline Phosphatase: 36 U/L — ABNORMAL LOW (ref 39–117)
BUN: 11 mg/dL (ref 6–23)
CO2: 26 mEq/L (ref 19–32)
Calcium: 9.5 mg/dL (ref 8.4–10.5)
Chloride: 104 mEq/L (ref 96–112)
Creatinine, Ser: 0.71 mg/dL (ref 0.40–1.20)
GFR: 87.65 mL/min (ref >60.00)
Glucose, Bld: 91 mg/dL (ref 70–99)
Potassium: 4.1 mEq/L (ref 3.5–5.1)
Sodium: 140 mEq/L (ref 135–145)
Total Bilirubin: 0.8 mg/dL (ref 0.2–1.2)
Total Protein: 6.5 g/dL (ref 6.0–8.3)

## 2020-08-15 LAB — SEDIMENTATION RATE: Sed Rate: 2 mm/hr (ref 0–30)

## 2020-08-15 LAB — VITAMIN B12: Vitamin B-12: 220 pg/mL (ref 211–911)

## 2020-08-15 LAB — TSH: TSH: 1.15 u[IU]/mL (ref 0.35–5.50)

## 2020-08-15 LAB — LIPASE: Lipase: 27 U/L (ref 11.0–59.0)

## 2020-08-15 NOTE — Telephone Encounter (Signed)
Called patient about lab results and she stated that she received a TDAP in our office and a went to pharmacy downstairs and was given another TDAP with her Shingles vaccines.  She is fine now, but is there any thing she should look out for?

## 2020-08-17 NOTE — Assessment & Plan Note (Signed)
She follows with psychiatry, Dr Pauline Good

## 2020-08-17 NOTE — Assessment & Plan Note (Signed)
Patient encouraged to maintain heart healthy diet, regular exercise, adequate sleep. Consider daily probiotics. Take medications as prescribed   Dexa scan ordered. Tdap given. Labs ordered and reviewed. She sees gastroenterology next week.

## 2020-08-17 NOTE — Assessment & Plan Note (Signed)
Encouraged to increase protein intake and monitor

## 2020-08-17 NOTE — Assessment & Plan Note (Signed)
Encourage heart healthy diet such as MIND or DASH diet, increase exercise, avoid trans fats, simple carbohydrates and processed foods, consider a krill or fish or flaxseed oil cap daily.  °

## 2020-08-17 NOTE — Assessment & Plan Note (Signed)
Recent testing negative for any infectious cause. She has an appointment with gastroenterology next week and she agrees to keep it. Avoid offending foods and hydrate well

## 2020-08-17 NOTE — Assessment & Plan Note (Signed)
Denies CP/palp/SOB/HA/congestion/fevers/GI or GU c/o. Taking meds as prescribed 

## 2020-08-17 NOTE — Assessment & Plan Note (Signed)
Avoid offending foods, start probiotics. Do not eat large meals in late evening and consider raising head of bed.  

## 2020-08-18 NOTE — Telephone Encounter (Signed)
Left detailed message on machine and to call back if needed.

## 2020-08-20 ENCOUNTER — Other Ambulatory Visit: Payer: Self-pay | Admitting: *Deleted

## 2020-08-20 ENCOUNTER — Encounter: Payer: Self-pay | Admitting: Family Medicine

## 2020-08-20 DIAGNOSIS — D7589 Other specified diseases of blood and blood-forming organs: Secondary | ICD-10-CM

## 2020-08-20 LAB — HM COLONOSCOPY

## 2020-08-31 ENCOUNTER — Encounter: Payer: Self-pay | Admitting: Family Medicine

## 2020-09-02 ENCOUNTER — Telehealth: Payer: Self-pay

## 2020-09-02 NOTE — Telephone Encounter (Signed)
Called to scheduled

## 2020-09-02 NOTE — Telephone Encounter (Signed)
Called pt to set up f/u for 4-8 weeks.

## 2020-09-17 ENCOUNTER — Encounter: Payer: Self-pay | Admitting: Family Medicine

## 2020-09-23 ENCOUNTER — Encounter: Payer: Self-pay | Admitting: Family Medicine

## 2020-10-09 ENCOUNTER — Encounter: Payer: Self-pay | Admitting: Family Medicine

## 2020-10-09 DIAGNOSIS — R194 Change in bowel habit: Secondary | ICD-10-CM

## 2020-10-09 DIAGNOSIS — R197 Diarrhea, unspecified: Secondary | ICD-10-CM

## 2020-10-20 ENCOUNTER — Encounter: Payer: Self-pay | Admitting: Family Medicine

## 2020-10-20 MED ORDER — LOSARTAN POTASSIUM 50 MG PO TABS: 50.0000 mg | ORAL_TABLET | Freq: Every day | ORAL | 1 refills | Status: DC

## 2020-11-06 ENCOUNTER — Other Ambulatory Visit: Payer: Self-pay

## 2020-11-06 DIAGNOSIS — I10 Essential (primary) hypertension: Secondary | ICD-10-CM

## 2020-11-06 DIAGNOSIS — E785 Hyperlipidemia, unspecified: Secondary | ICD-10-CM

## 2020-11-06 NOTE — Telephone Encounter (Signed)
Pt scheduled and orders placed 

## 2020-11-14 ENCOUNTER — Other Ambulatory Visit: Payer: Self-pay

## 2020-11-14 ENCOUNTER — Other Ambulatory Visit (HOSPITAL_BASED_OUTPATIENT_CLINIC_OR_DEPARTMENT_OTHER): Payer: Self-pay

## 2020-11-14 ENCOUNTER — Other Ambulatory Visit (INDEPENDENT_AMBULATORY_CARE_PROVIDER_SITE_OTHER): Payer: Medicare Other

## 2020-11-14 DIAGNOSIS — E785 Hyperlipidemia, unspecified: Secondary | ICD-10-CM

## 2020-11-14 DIAGNOSIS — D7589 Other specified diseases of blood and blood-forming organs: Secondary | ICD-10-CM

## 2020-11-14 DIAGNOSIS — I10 Essential (primary) hypertension: Secondary | ICD-10-CM

## 2020-11-14 LAB — COMPREHENSIVE METABOLIC PANEL
ALT: 14 U/L (ref 0–35)
AST: 18 U/L (ref 0–37)
Albumin: 3.8 g/dL (ref 3.5–5.2)
Alkaline Phosphatase: 39 U/L (ref 39–117)
BUN: 13 mg/dL (ref 6–23)
CO2: 28 mEq/L (ref 19–32)
Calcium: 9.2 mg/dL (ref 8.4–10.5)
Chloride: 104 mEq/L (ref 96–112)
Creatinine, Ser: 0.82 mg/dL (ref 0.40–1.20)
GFR: 73.61 mL/min (ref >60.00)
Glucose, Bld: 94 mg/dL (ref 70–99)
Potassium: 4.1 mEq/L (ref 3.5–5.1)
Sodium: 138 mEq/L (ref 135–145)
Total Bilirubin: 0.4 mg/dL (ref 0.2–1.2)
Total Protein: 6.2 g/dL (ref 6.0–8.3)

## 2020-11-14 LAB — CBC WITH DIFFERENTIAL/PLATELET
Basophils Absolute: 0.1 10*3/uL (ref 0.0–0.1)
Basophils Relative: 2.3 % (ref 0.0–3.0)
Eosinophils Absolute: 0.4 10*3/uL (ref 0.0–0.7)
Eosinophils Relative: 8.2 % — ABNORMAL HIGH (ref 0.0–5.0)
HCT: 37.1 % (ref 36.0–46.0)
Hemoglobin: 12.3 g/dL (ref 12.0–15.0)
Lymphocytes Relative: 26.2 % (ref 12.0–46.0)
Lymphs Abs: 1.3 10*3/uL (ref 0.7–4.0)
MCHC: 33.2 g/dL (ref 30.0–36.0)
MCV: 102.6 fl — ABNORMAL HIGH (ref 78.0–100.0)
Monocytes Absolute: 0.4 10*3/uL (ref 0.1–1.0)
Monocytes Relative: 8.5 % (ref 3.0–12.0)
Neutro Abs: 2.7 10*3/uL (ref 1.4–7.7)
Neutrophils Relative %: 54.8 % (ref 43.0–77.0)
Platelets: 191 10*3/uL (ref 150.0–400.0)
RBC: 3.62 Mil/uL — ABNORMAL LOW (ref 3.87–5.11)
RDW: 12.6 % (ref 11.5–15.5)
WBC: 4.9 10*3/uL (ref 4.0–10.5)

## 2020-11-14 LAB — TSH: TSH: 2.15 u[IU]/mL (ref 0.35–5.50)

## 2020-11-14 LAB — LIPID PANEL
Cholesterol: 157 mg/dL (ref 0–200)
HDL: 41.3 mg/dL (ref >39.00)
Total CHOL/HDL Ratio: 4
Triglycerides: 559 mg/dL — ABNORMAL HIGH (ref 0.0–149.0)

## 2020-11-14 LAB — VITAMIN B12: Vitamin B-12: 440 pg/mL (ref 211–911)

## 2020-11-14 LAB — SEDIMENTATION RATE: Sed Rate: 1 mm/hr (ref 0–30)

## 2020-11-14 LAB — LDL CHOLESTEROL, DIRECT: Direct LDL: 50 mg/dL

## 2020-11-14 MED ORDER — ZOSTER VAC RECOMB ADJUVANTED 50 MCG/0.5ML IM SUSR
INTRAMUSCULAR | 0 refills | Status: DC
  Filled 2020-11-14: qty 1, 1d supply, fill #0

## 2020-11-14 MED ORDER — INFLUENZA VAC A&B SA ADJ QUAD 0.5 ML IM PRSY
PREFILLED_SYRINGE | INTRAMUSCULAR | 0 refills | Status: DC
  Filled 2020-11-14: qty 0.5, 1d supply, fill #0

## 2020-11-15 ENCOUNTER — Encounter: Payer: Self-pay | Admitting: Family Medicine

## 2020-11-15 LAB — IRON,TIBC AND FERRITIN PANEL
%SAT: 50 % (calc) — ABNORMAL HIGH (ref 16–45)
Ferritin: 362 ng/mL — ABNORMAL HIGH (ref 16–288)
Iron: 123 ug/dL (ref 45–160)
TIBC: 246 mcg/dL (calc) — ABNORMAL LOW (ref 250–450)

## 2020-11-17 ENCOUNTER — Other Ambulatory Visit: Payer: Self-pay

## 2020-11-17 DIAGNOSIS — E782 Mixed hyperlipidemia: Secondary | ICD-10-CM

## 2020-11-17 DIAGNOSIS — Z0000 Encounter for general adult medical examination without abnormal findings: Secondary | ICD-10-CM

## 2020-11-17 MED ORDER — ALBUTEROL SULFATE HFA 108 (90 BASE) MCG/ACT IN AERS: INHALATION_SPRAY | RESPIRATORY_TRACT | 5 refills | Status: DC

## 2020-11-17 MED ORDER — FENOFIBRATE 134 MG PO CAPS: 134.0000 mg | ORAL_CAPSULE | Freq: Every day | ORAL | 11 refills | Status: DC

## 2020-11-17 NOTE — Telephone Encounter (Signed)
Spoke with pt medication sent in and checking on GI referral

## 2020-11-20 ENCOUNTER — Ambulatory Visit (HOSPITAL_BASED_OUTPATIENT_CLINIC_OR_DEPARTMENT_OTHER)
Admission: RE | Admit: 2020-11-20 | Discharge: 2020-11-20 | Disposition: A | Discharge status: Home / Self Care | Payer: Medicare Other | Source: Ambulatory Visit | Attending: Family Medicine | Admitting: Family Medicine

## 2020-11-20 ENCOUNTER — Other Ambulatory Visit: Payer: Self-pay

## 2020-11-20 ENCOUNTER — Encounter: Payer: Self-pay | Admitting: Family Medicine

## 2020-11-20 DIAGNOSIS — E2839 Other primary ovarian failure: Secondary | ICD-10-CM | POA: Insufficient documentation

## 2020-11-20 DIAGNOSIS — Z780 Asymptomatic menopausal state: Secondary | ICD-10-CM | POA: Diagnosis present

## 2020-11-21 ENCOUNTER — Other Ambulatory Visit: Payer: Self-pay | Admitting: Family Medicine

## 2020-11-21 MED ORDER — BENZONATATE 100 MG PO CAPS: 100.0000 mg | ORAL_CAPSULE | Freq: Three times a day (TID) | ORAL | 1 refills | Status: DC | PRN

## 2020-11-25 ENCOUNTER — Encounter: Payer: Self-pay | Admitting: Family Medicine

## 2020-11-26 NOTE — Telephone Encounter (Signed)
Sent over request

## 2020-11-27 NOTE — Telephone Encounter (Signed)
Pt. Called in and stated that [facility] called and stated they would not see her. She doesn't know what to do from here and really needed to see someone.

## 2020-12-08 MED ORDER — BENZONATATE 100 MG PO CAPS: 100.0000 mg | ORAL_CAPSULE | Freq: Three times a day (TID) | ORAL | 1 refills | Status: DC | PRN

## 2020-12-12 ENCOUNTER — Encounter: Payer: Self-pay | Admitting: Family Medicine

## 2020-12-15 ENCOUNTER — Other Ambulatory Visit: Payer: Self-pay

## 2020-12-15 ENCOUNTER — Encounter: Payer: Self-pay | Admitting: Family Medicine

## 2020-12-15 ENCOUNTER — Other Ambulatory Visit: Payer: Self-pay | Admitting: Family Medicine

## 2020-12-15 MED ORDER — METHYLPREDNISOLONE 4 MG PO TABS: ORAL_TABLET | ORAL | 0 refills | Status: DC

## 2020-12-15 NOTE — Progress Notes (Unsigned)
.  ed

## 2020-12-15 NOTE — Telephone Encounter (Signed)
PCP prescribed medrol. Pt made aware.   [facility] Night - ClientTELEPHONE ADVICE RECORDAccessNurse PatientName:Casey Rowe ReturnPhoneNumber:[phone](Primary)Who Is Calling Patient / Member / Family / Caregiver Caller states she is coughing, feels pressure on her lungs, and is wheezing. Translation No Nurse Assessment Nurse: Ricard Dillon, RN, [name] Date/Time (Eastern Time): 12/14/2020 10:07:42 AM Confirm and document reason for call. If symptomatic, describe symptoms. ---Caller states she is coughing, feels pressure on her lungs, and is wheezing. Does the patient have any new or worsening symptoms? ---Yes Will a triage be completed? ---Yes Related visit to physician within the last 2 weeks? ---No Does the PT have any chronic conditions? (i.e. diabetes, asthma, this includes High risk factors for pregnancy, etc.) ---Yes List chronic conditions. ---hypertension Is this a behavioral health or substance abuse call? ---No Guidelines Guideline Title Affirmed Question Affirmed Notes Nurse Date/Time (Eastern Time) COVID-19 - Diagnosed or Suspected MODERATE difficulty breathing (e.g., speaks in phrases, SOB even at rest, pulse 100-120) Ricard Dillon, RN, [facility] 12/14/2020 10:10:07 AM Disp. Time Eilene Ghazi Time) Disposition Final User 12/14/2020 10:05:29 AM Send to Urgent Queue [name] 12/14/2020 10:15:56 AM Go to ED Now Yes Ricard Dillon, RN, [name] PLEASE NOTE: All timestamps contained within this report are represented as [place] Standard Time. CONFIDENTIALTY NOTICE: This fax transmission is intended only for the addressee. It contains information that is legally privileged, confidential or otherwise protected from use or disclosure. If you are not the intended recipient, you are strictly prohibited from reviewing, disclosing, copying using or disseminating any of this information or taking any action in reliance on or regarding this information. If you have received  this fax in error, please notify us immediately by telephone so that we can arrange for its return to [place]. Phone: [phone], Toll-Free: [phone], Fax: [phone] Page: 2 of 2 Call Id: [id number] Caller Disagree/Comply Disagree Caller Understands Yes PreDisposition Call Doctor Care Advice Given Per Guideline GO TO ED NOW: * Go to the ED at ___________ [place] now. Drive carefully. CALL EMS 911 IF: * Severe difficulty breathing occurs * Lips or face turns blue * Confusion occurs. CARE ADVICE given per COVID-19 - DIAGNOSED OR SUSPECTED (Adult) guideline. ANOTHER ADULT SHOULD DRIVE: * It is better and safer if another adult drives instead of you. Referrals REFERRED TO PCP OFFIC

## 2020-12-16 ENCOUNTER — Encounter: Payer: Self-pay | Admitting: Family Medicine

## 2020-12-29 ENCOUNTER — Encounter: Payer: Self-pay | Admitting: Family Medicine

## 2020-12-30 ENCOUNTER — Other Ambulatory Visit: Payer: Self-pay | Admitting: Family Medicine

## 2020-12-30 DIAGNOSIS — R197 Diarrhea, unspecified: Secondary | ICD-10-CM

## 2020-12-30 DIAGNOSIS — R109 Unspecified abdominal pain: Secondary | ICD-10-CM

## 2020-12-31 ENCOUNTER — Encounter: Payer: Self-pay | Admitting: Family Medicine

## 2020-12-31 NOTE — Telephone Encounter (Signed)
Patient scheduled for lab and she will see how much her part after scheduling for CT.

## 2021-01-05 ENCOUNTER — Other Ambulatory Visit (INDEPENDENT_AMBULATORY_CARE_PROVIDER_SITE_OTHER): Payer: Medicare Other

## 2021-01-05 ENCOUNTER — Other Ambulatory Visit: Payer: Self-pay

## 2021-01-05 ENCOUNTER — Telehealth: Payer: Self-pay | Admitting: Family Medicine

## 2021-01-05 DIAGNOSIS — E782 Mixed hyperlipidemia: Secondary | ICD-10-CM | POA: Diagnosis not present

## 2021-01-05 LAB — LIPID PANEL
Cholesterol: 156 mg/dL (ref 0–200)
HDL: 54.4 mg/dL (ref >39.00)
LDL Cholesterol: 67 mg/dL (ref 0–99)
NonHDL: 101.3
Total CHOL/HDL Ratio: 3
Triglycerides: 173 mg/dL — ABNORMAL HIGH (ref 0.0–149.0)
VLDL: 34.6 mg/dL (ref 0.0–40.0)

## 2021-01-05 NOTE — Telephone Encounter (Signed)
Casey Rowe from pre-service stated [place] state requires authorization for ct scan. Her number is listed in contacts and extension is [phone]. Please advise.

## 2021-01-06 ENCOUNTER — Other Ambulatory Visit (INDEPENDENT_AMBULATORY_CARE_PROVIDER_SITE_OTHER): Payer: Medicare Other

## 2021-01-06 DIAGNOSIS — R109 Unspecified abdominal pain: Secondary | ICD-10-CM

## 2021-01-06 DIAGNOSIS — R197 Diarrhea, unspecified: Secondary | ICD-10-CM

## 2021-01-06 LAB — COMPREHENSIVE METABOLIC PANEL
ALT: 9 U/L (ref 0–35)
AST: 15 U/L (ref 0–37)
Albumin: 4.1 g/dL (ref 3.5–5.2)
Alkaline Phosphatase: 32 U/L — ABNORMAL LOW (ref 39–117)
BUN: 17 mg/dL (ref 6–23)
CO2: 24 mEq/L (ref 19–32)
Calcium: 9.8 mg/dL (ref 8.4–10.5)
Chloride: 106 mEq/L (ref 96–112)
Creatinine, Ser: 0.94 mg/dL (ref 0.40–1.20)
GFR: 62.42 mL/min (ref >60.00)
Glucose, Bld: 83 mg/dL (ref 70–99)
Potassium: 4.3 mEq/L (ref 3.5–5.1)
Sodium: 140 mEq/L (ref 135–145)
Total Bilirubin: 0.3 mg/dL (ref 0.2–1.2)
Total Protein: 6.3 g/dL (ref 6.0–8.3)

## 2021-01-08 ENCOUNTER — Ambulatory Visit (HOSPITAL_COMMUNITY): Payer: BC Managed Care – PPO

## 2021-01-14 ENCOUNTER — Encounter: Payer: Self-pay | Admitting: Family Medicine

## 2021-01-14 DIAGNOSIS — K582 Mixed irritable bowel syndrome: Secondary | ICD-10-CM

## 2021-01-14 DIAGNOSIS — R630 Anorexia: Secondary | ICD-10-CM | POA: Insufficient documentation

## 2021-01-14 HISTORY — DX: Mixed irritable bowel syndrome: K58.2

## 2021-01-14 HISTORY — DX: Anorexia: R63.0

## 2021-01-16 ENCOUNTER — Encounter: Payer: Self-pay | Admitting: Family Medicine

## 2021-01-28 ENCOUNTER — Other Ambulatory Visit: Payer: Self-pay

## 2021-01-28 DIAGNOSIS — N951 Menopausal and female climacteric states: Secondary | ICD-10-CM

## 2021-01-28 MED ORDER — ESTRADIOL 1 MG PO TABS
1.0000 mg | ORAL_TABLET | Freq: Every day | ORAL | 1 refills | Status: DC
Start: 2021-01-28 — End: 2021-07-31

## 2021-01-28 MED ORDER — PROGESTERONE MICRONIZED 100 MG PO CAPS: 100.0000 mg | ORAL_CAPSULE | Freq: Every day | ORAL | 1 refills | Status: DC

## 2021-01-28 NOTE — Telephone Encounter (Signed)
Pt called requesting refill on Prometrium, and Estrace. Rx sent to [facility]

## 2021-01-29 ENCOUNTER — Ambulatory Visit (HOSPITAL_COMMUNITY)
Admission: RE | Admit: 2021-01-29 | Discharge: 2021-01-29 | Disposition: A | Discharge status: Home / Self Care | Payer: Medicare Other | Source: Ambulatory Visit | Attending: Family Medicine | Admitting: Family Medicine

## 2021-01-29 ENCOUNTER — Other Ambulatory Visit: Payer: Self-pay

## 2021-01-29 DIAGNOSIS — R197 Diarrhea, unspecified: Secondary | ICD-10-CM | POA: Insufficient documentation

## 2021-01-29 DIAGNOSIS — R109 Unspecified abdominal pain: Secondary | ICD-10-CM | POA: Diagnosis present

## 2021-01-29 MED ORDER — SODIUM CHLORIDE (PF) 0.9 % IJ SOLN
INTRAMUSCULAR | Status: AC
  Filled 2021-01-29: qty 50

## 2021-01-29 MED ORDER — IOHEXOL 350 MG/ML SOLN
75.0000 mL | Freq: Once | INTRAVENOUS | Status: AC | PRN
  Administered 2021-01-29: 15:00:00 75 mL via INTRAVENOUS

## 2021-01-30 ENCOUNTER — Encounter: Payer: Self-pay | Admitting: Family Medicine

## 2021-01-30 MED ORDER — FLUTICASONE-SALMETEROL 100-50 MCG/ACT IN AEPB: 1.0000 | INHALATION_SPRAY | Freq: Two times a day (BID) | RESPIRATORY_TRACT | 5 refills | Status: DC

## 2021-02-04 ENCOUNTER — Telehealth: Payer: Self-pay | Admitting: *Deleted

## 2021-02-04 NOTE — Telephone Encounter (Signed)
Pt has lab appointment on 02/11/21.  There are no future orders in Epic.  Chart review shows labs ordered on 12/30/20 for Comp, CBCD and food allergy panel that have not been completed yet.  Is this what pt is supposed to have done at next lab appointment?  If so, she had a comp done on 01/06/21; does she still need that repeated?

## 2021-02-05 NOTE — Telephone Encounter (Signed)
Ok, thank you

## 2021-02-10 ENCOUNTER — Encounter: Payer: Self-pay | Admitting: Family Medicine

## 2021-02-11 ENCOUNTER — Other Ambulatory Visit: Payer: BC Managed Care – PPO

## 2021-02-13 ENCOUNTER — Encounter: Payer: Self-pay | Admitting: Family Medicine

## 2021-02-13 ENCOUNTER — Other Ambulatory Visit: Payer: Self-pay | Admitting: Family Medicine

## 2021-02-13 MED ORDER — MOLNUPIRAVIR EUA 200MG CAPSULE
4.0000 | ORAL_CAPSULE | Freq: Two times a day (BID) | ORAL | 0 refills | Status: AC
  Filled 2021-02-16: qty 40, 5d supply, fill #0

## 2021-02-15 ENCOUNTER — Encounter: Payer: Self-pay | Admitting: Family Medicine

## 2021-02-16 ENCOUNTER — Other Ambulatory Visit (HOSPITAL_BASED_OUTPATIENT_CLINIC_OR_DEPARTMENT_OTHER): Payer: Self-pay

## 2021-02-16 NOTE — Telephone Encounter (Signed)
Spoke with pt and she didn't have a chance to pick up medication, she said she was feeling pretty terrible and couldn't. She states that she is still testing positive and believe she has the new strand of covid.

## 2021-02-16 NOTE — Telephone Encounter (Signed)
Spoke with see message before this one

## 2021-02-18 ENCOUNTER — Telehealth: Payer: Self-pay

## 2021-02-18 NOTE — Telephone Encounter (Signed)
Called pt to see how she was feeling and husband answered stated that she was feeling much better

## 2021-02-21 ENCOUNTER — Other Ambulatory Visit: Payer: Self-pay | Admitting: Family Medicine

## 2021-02-23 ENCOUNTER — Other Ambulatory Visit (INDEPENDENT_AMBULATORY_CARE_PROVIDER_SITE_OTHER): Payer: Medicare PPO

## 2021-02-23 DIAGNOSIS — R197 Diarrhea, unspecified: Secondary | ICD-10-CM | POA: Diagnosis not present

## 2021-02-23 DIAGNOSIS — R109 Unspecified abdominal pain: Secondary | ICD-10-CM

## 2021-02-23 LAB — CBC WITH DIFFERENTIAL/PLATELET
Basophils Absolute: 0.1 10*3/uL (ref 0.0–0.1)
Basophils Relative: 1.4 % (ref 0.0–3.0)
Eosinophils Absolute: 0.1 10*3/uL (ref 0.0–0.7)
Eosinophils Relative: 1.7 % (ref 0.0–5.0)
HCT: 37 % (ref 36.0–46.0)
Hemoglobin: 12 g/dL (ref 12.0–15.0)
Lymphocytes Relative: 32.6 % (ref 12.0–46.0)
Lymphs Abs: 1.7 10*3/uL (ref 0.7–4.0)
MCHC: 32.5 g/dL (ref 30.0–36.0)
MCV: 100.6 fl — ABNORMAL HIGH (ref 78.0–100.0)
Monocytes Absolute: 0.5 10*3/uL (ref 0.1–1.0)
Monocytes Relative: 8.4 % (ref 3.0–12.0)
Neutro Abs: 3 10*3/uL (ref 1.4–7.7)
Neutrophils Relative %: 55.9 % (ref 43.0–77.0)
Platelets: 244 10*3/uL (ref 150.0–400.0)
RBC: 3.68 Mil/uL — ABNORMAL LOW (ref 3.87–5.11)
RDW: 12 % (ref 11.5–15.5)
WBC: 5.4 10*3/uL (ref 4.0–10.5)

## 2021-02-23 LAB — COMPREHENSIVE METABOLIC PANEL
ALT: 12 U/L (ref 0–35)
AST: 15 U/L (ref 0–37)
Albumin: 3.9 g/dL (ref 3.5–5.2)
Alkaline Phosphatase: 32 U/L — ABNORMAL LOW (ref 39–117)
BUN: 14 mg/dL (ref 6–23)
CO2: 28 mEq/L (ref 19–32)
Calcium: 9.2 mg/dL (ref 8.4–10.5)
Chloride: 105 mEq/L (ref 96–112)
Creatinine, Ser: 0.87 mg/dL (ref 0.40–1.20)
GFR: 68.43 mL/min (ref >60.00)
Glucose, Bld: 80 mg/dL (ref 70–99)
Potassium: 4 mEq/L (ref 3.5–5.1)
Sodium: 139 mEq/L (ref 135–145)
Total Bilirubin: 0.4 mg/dL (ref 0.2–1.2)
Total Protein: 6.2 g/dL (ref 6.0–8.3)

## 2021-02-24 ENCOUNTER — Other Ambulatory Visit: Payer: Self-pay

## 2021-02-24 LAB — FOOD ALLERGY PROFILE
Allergen, Salmon, f41: <0.1 kU/L
Almonds: <0.1 kU/L
CLASS: 0
CLASS: 0
CLASS: 0
CLASS: 0
CLASS: 0
CLASS: 0
CLASS: 0
CLASS: 0
CLASS: 0
CLASS: 0
CLASS: 0
Cashew IgE: <0.1 kU/L
Class: 0
Class: 0
Class: 0
Egg White IgE: <0.1 kU/L
Fish Cod: <0.1 kU/L
Hazelnut: <0.1 kU/L
Milk IgE: 0.11 kU/L — ABNORMAL HIGH
Peanut IgE: <0.1 kU/L
Scallop IgE: <0.1 kU/L
Sesame Seed f10: <0.1 kU/L
Shrimp IgE: <0.1 kU/L
Soybean IgE: <0.1 kU/L
Tuna IgE: <0.1 kU/L
Walnut: <0.1 kU/L
Wheat IgE: <0.1 kU/L

## 2021-02-24 LAB — INTERPRETATION:

## 2021-02-24 MED ORDER — METOPROLOL SUCCINATE ER 50 MG PO TB24: ORAL_TABLET | ORAL | 1 refills | Status: DC

## 2021-02-25 ENCOUNTER — Ambulatory Visit: Payer: Medicare Other | Admitting: Family Medicine

## 2021-03-05 ENCOUNTER — Other Ambulatory Visit (HOSPITAL_BASED_OUTPATIENT_CLINIC_OR_DEPARTMENT_OTHER): Payer: Self-pay | Admitting: Family Medicine

## 2021-03-05 DIAGNOSIS — Z1231 Encounter for screening mammogram for malignant neoplasm of breast: Secondary | ICD-10-CM

## 2021-03-06 ENCOUNTER — Other Ambulatory Visit: Payer: Self-pay | Admitting: Family Medicine

## 2021-03-16 ENCOUNTER — Ambulatory Visit (INDEPENDENT_AMBULATORY_CARE_PROVIDER_SITE_OTHER): Payer: Medicare PPO | Admitting: Obstetrics & Gynecology

## 2021-03-16 ENCOUNTER — Encounter: Payer: Self-pay | Admitting: Obstetrics & Gynecology

## 2021-03-16 ENCOUNTER — Other Ambulatory Visit: Payer: Self-pay

## 2021-03-16 VITALS — BP 124/67 | HR 58 | Ht 64.0 in | Wt 119.0 lb

## 2021-03-16 DIAGNOSIS — Z01419 Encounter for gynecological examination (general) (routine) without abnormal findings: Secondary | ICD-10-CM

## 2021-03-16 DIAGNOSIS — Z79890 Hormone replacement therapy (postmenopausal): Secondary | ICD-10-CM | POA: Diagnosis not present

## 2021-03-16 DIAGNOSIS — N951 Menopausal and female climacteric states: Secondary | ICD-10-CM

## 2021-03-16 NOTE — Progress Notes (Addendum)
GYNECOLOGY ANNUAL PREVENTATIVE CARE ENCOUNTER NOTE  History:     SHALISSA Rowe is a 71 y.o. [phone] female  s/p TAH for benign indications in 1990s here for a routine annual gynecologic exam.  Current complaints: none. On HRT for vasomotor symptoms, no complaints   Denies abnormal vaginal bleeding, discharge, pelvic pain, problems with intercourse or other gynecologic concerns.    Gynecologic History Patient's last menstrual period was 02/08/1997. Last Mammogram: 2/7/20222.  Result was normal. Scheduled on 03/23/2021. Last Colonoscopy: 08/20/2020.  Result was normal. Done at [facility]  Obstetric History OB History  Gravida Para Term Preterm AB Living  6 3 2 1 3 3   SAB IAB Ectopic Multiple Live Births  2 1     3     # Outcome Date GA Lbr Len/2nd Weight Sex Delivery Anes PTL Lv  6 Term 1990 [redacted]w[redacted]d   M CS-LTranv Spinal N LIV  5 Preterm 1983 [id number]   F CS-LTranv EPI N LIV  4 Term 1973 [redacted]w[redacted]d   M Vag-Spont EPI N LIV  3 SAB           2 SAB           1 IAB             Past Medical History:  Diagnosis Date   Allergy    Anxiety    Asthma    Broken jaw ([place]) 1980s   Depression    Depression with anxiety    Hypertension    Preventative health care 03/16/2015   Sarcoidosis    Tubal pregnancy 1981    Past Surgical History:  Procedure Laterality Date   ABDOMINAL HYSTERECTOMY     CESAREAN SECTION     ECTOPIC PREGNANCY SURGERY  1981   FRACTURE SURGERY  1980s   broken jaw    Current Outpatient Medications on File Prior to Visit  Medication Sig Dispense Refill   albuterol (VENTOLIN HFA) 108 (90 Base) MCG/ACT inhaler INHALE 2 PUFFS INTO THE LUNGS EVERY 6 HOURS AS NEEDED FOR WHEEZING 18 g 5   ALPRAZolam (XANAX) 0.25 MG tablet Take 0.25 mg by mouth daily.      azelastine (ASTELIN) 0.1 % nasal spray Place 2 sprays into both nostrils 2 (two) times daily. Use in each nostril as directed 30 mL 12   benzonatate (TESSALON) 100 MG capsule Take 1-2 capsules (100-200 mg total) by mouth 3 (three)  times daily as needed for cough. 40 capsule 1   buPROPion (WELLBUTRIN XL) 300 MG 24 hr tablet Take 300 mg by mouth daily.     busPIRone (BUSPAR) 5 MG tablet Take 5 mg by mouth 3 (three) times daily.      estradiol (ESTRACE) 1 MG tablet Take 1 tablet (1 mg total) by mouth daily. 90 tablet 1   fenofibrate micronized (LOFIBRA) 134 MG capsule Take 1 capsule (134 mg total) by mouth daily before breakfast. 30 capsule 11   fluticasone-salmeterol (ADVAIR) 100-50 MCG/ACT AEPB Inhale 1 puff into the lungs 2 (two) times daily. 60 each 5   losartan (COZAAR) 50 MG tablet TAKE ONE TABLET BY MOUTH DAILY 90 tablet 1   methylPREDNISolone (MEDROL) 4 MG tablet 5 tabs po x 1 day then 4 tabs po x 1 day then 3 tabs po x 1 day then 2 tabs po x 1 day then 1 tab po x 1 day and stop 15 tablet 0   metoprolol succinate (TOPROL-XL) 50 MG 24 hr tablet TAKE ONE TABLET BY MOUTH ONE  TIME DAILY WITH A MEAL 90 tablet 1   progesterone (PROMETRIUM) 100 MG capsule Take 1 capsule (100 mg total) by mouth daily. 90 capsule 1   saccharomyces boulardii (FLORASTOR) 250 MG capsule Take 250 mg by mouth daily.     influenza vaccine adjuvanted (FLUAD) 0.5 ML injection Inject into the muscle. (Patient not taking: Reported on 03/16/2021) 0.5 mL 0   METRONIDAZOLE, TOPICAL, 0.75 % LOTN Apply 1 Dose topically daily. (Patient not taking: Reported on 03/16/2021) 59 mL 3   Tdap (BOOSTRIX) 5-2.5-18.5 LF-MCG/0.5 injection Inject into the muscle. (Patient not taking: Reported on 03/16/2021) 0.5 mL 0   Zoster Vaccine Adjuvanted [facility]) injection Inject into the muscle. (Patient not taking: Reported on 03/16/2021) 1 each 0   Zoster Vaccine Adjuvanted [facility]) injection Inject into the muscle. (Patient not taking: Reported on 03/16/2021) 1 each 0   No current facility-administered medications on file prior to visit.    Allergies  Allergen Reactions   Demerol Nausea And Vomiting   Ibuprofen Other (See Comments)    bruises   Singulair [Montelukast]     Dry  mouth/dehydration   Augmentin [Amoxicillin-Pot Clavulanate] Diarrhea    Caused Diarrhea    Social History:  reports that she has never smoked. She has never used smokeless tobacco. She reports current alcohol use. She reports that she does not use drugs.  Family History  Problem Relation Age of Onset   Hypertension Mother    Dementia Mother    Diabetes Mother    Diabetes Father    Heart attack Father    Heart disease Father    Psoriasis Father    Cancer Sister        pancreatic   Alcohol abuse Sister        substance   Hepatitis B Brother    Gout Brother    Hypertension Brother    Rashes / Skin problems Son    Other Son        bad allergies, aspirin, dermatitis   Rashes / Skin problems Son    Cancer Maternal Aunt        breast   Breast cancer Maternal Aunt     The following portions of the patient's history were reviewed and updated as appropriate: allergies, current medications, past family history, past medical history, past social history, past surgical history and problem list.  Review of Systems Pertinent items noted in HPI and remainder of comprehensive ROS otherwise negative.  Physical Exam:  BP 124/67    Pulse (!) 58    Ht 5\' 4"  (1.626 m)    Wt 119 lb (54 kg)    LMP 02/08/1997    BMI 20.43 kg/m  CONSTITUTIONAL: Well-developed, well-nourished female in no acute distress.  HENT:  Normocephalic, atraumatic, External right and left ear normal.  EYES: Conjunctivae and EOM are normal. Pupils are equal, round, and reactive to light. No scleral icterus.  NECK: Normal range of motion, supple, no masses.  Normal thyroid.  SKIN: Skin is warm and dry. No rash noted. Not diaphoretic. No erythema. No pallor. MUSCULOSKELETAL: Normal range of motion. No tenderness.  No cyanosis, clubbing, or edema. NEUROLOGIC: Alert and oriented to person, place, and time. Normal reflexes, muscle tone coordination.  PSYCHIATRIC: Normal mood and affect. Normal behavior. Normal judgment and  thought content. CARDIOVASCULAR: Normal heart rate noted, regular rhythm RESPIRATORY: Clear to auscultation bilaterally. Effort and breath sounds normal, no problems with respiration noted. BREASTS: Symmetric in size. No masses, tenderness, skin changes, nipple drainage,  or lymphadenopathy bilaterally. Performed in the presence of a chaperone. ABDOMEN: Soft, no distention noted.  No tenderness, rebound or guarding.  PELVIC: Normal appearing external genitalia and urethral meatus with mild atrophy; atrophic vaginal mucosa and cuff  No abnormal vaginal discharge noted.   No other palpable masses, no tenderness on bimanual exam.  Performed in the presence of a chaperone.   Assessment and Plan:    1. Menopausal syndrome on hormone replacement therapy Discussed cutting down or discontinuing HRT due to risks associated with HRT in the postmenopausal period, patient declined for now.  She will consider this and let us know.  Continue current regimen of Estradiol 1 mg and Prometrium 100 mg daily.   2. Well woman exam with routine gynecological exam Normal age-appropriate examination findings, s/p total hysterectomy. No pap smears indicated.  Mammogram  and colon cancer screening are up to date. Routine preventative health maintenance measures emphasized. Please refer to After Visit Summary for other counseling recommendations.      Verita Schneiders, MD, [place] for [employer], [place]

## 2021-03-19 ENCOUNTER — Encounter: Payer: Self-pay | Admitting: Family Medicine

## 2021-03-19 MED ORDER — BENZONATATE 100 MG PO CAPS: 100.0000 mg | ORAL_CAPSULE | Freq: Three times a day (TID) | ORAL | 1 refills | Status: DC | PRN

## 2021-03-23 ENCOUNTER — Ambulatory Visit (HOSPITAL_BASED_OUTPATIENT_CLINIC_OR_DEPARTMENT_OTHER)
Admission: RE | Admit: 2021-03-23 | Discharge: 2021-03-23 | Disposition: A | Discharge status: Home / Self Care | Payer: Medicare PPO | Source: Ambulatory Visit | Attending: Family Medicine | Admitting: Family Medicine

## 2021-03-23 ENCOUNTER — Encounter: Payer: Self-pay | Admitting: Family Medicine

## 2021-03-23 ENCOUNTER — Encounter (HOSPITAL_BASED_OUTPATIENT_CLINIC_OR_DEPARTMENT_OTHER): Payer: Self-pay

## 2021-03-23 ENCOUNTER — Other Ambulatory Visit: Payer: Self-pay

## 2021-03-23 DIAGNOSIS — Z1231 Encounter for screening mammogram for malignant neoplasm of breast: Secondary | ICD-10-CM | POA: Diagnosis present

## 2021-03-26 ENCOUNTER — Emergency Department (HOSPITAL_BASED_OUTPATIENT_CLINIC_OR_DEPARTMENT_OTHER): Payer: Medicare PPO

## 2021-03-26 ENCOUNTER — Emergency Department (HOSPITAL_BASED_OUTPATIENT_CLINIC_OR_DEPARTMENT_OTHER)
Admission: EM | Admit: 2021-03-26 | Discharge: 2021-03-27 | Disposition: A | Discharge status: Home / Self Care | Payer: Medicare PPO | Attending: Emergency Medicine | Admitting: Emergency Medicine

## 2021-03-26 ENCOUNTER — Encounter (HOSPITAL_BASED_OUTPATIENT_CLINIC_OR_DEPARTMENT_OTHER): Payer: Self-pay | Admitting: Emergency Medicine

## 2021-03-26 ENCOUNTER — Other Ambulatory Visit: Payer: Self-pay

## 2021-03-26 DIAGNOSIS — U071 COVID-19: Secondary | ICD-10-CM | POA: Diagnosis not present

## 2021-03-26 DIAGNOSIS — J45901 Unspecified asthma with (acute) exacerbation: Secondary | ICD-10-CM | POA: Diagnosis not present

## 2021-03-26 DIAGNOSIS — R0602 Shortness of breath: Secondary | ICD-10-CM | POA: Diagnosis present

## 2021-03-26 DIAGNOSIS — Z7951 Long term (current) use of inhaled steroids: Secondary | ICD-10-CM | POA: Diagnosis not present

## 2021-03-26 LAB — CBC WITH DIFFERENTIAL/PLATELET
Abs Immature Granulocytes: 0.01 10*3/uL (ref 0.00–0.07)
Basophils Absolute: 0.1 10*3/uL (ref 0.0–0.1)
Basophils Relative: 2 %
Eosinophils Absolute: 0.7 10*3/uL — ABNORMAL HIGH (ref 0.0–0.5)
Eosinophils Relative: 11 %
HCT: 40.1 % (ref 36.0–46.0)
Hemoglobin: 13.6 g/dL (ref 12.0–15.0)
Immature Granulocytes: 0 %
Lymphocytes Relative: 32 %
Lymphs Abs: 2 10*3/uL (ref 0.7–4.0)
MCH: 33.6 pg (ref 26.0–34.0)
MCHC: 33.9 g/dL (ref 30.0–36.0)
MCV: 99 fL (ref 80.0–100.0)
Monocytes Absolute: 0.6 10*3/uL (ref 0.1–1.0)
Monocytes Relative: 10 %
Neutro Abs: 2.8 10*3/uL (ref 1.7–7.7)
Neutrophils Relative %: 45 %
Platelets: 260 10*3/uL (ref 150–400)
RBC: 4.05 MIL/uL (ref 3.87–5.11)
RDW: 12 % (ref 11.5–15.5)
WBC: 6.2 10*3/uL (ref 4.0–10.5)
nRBC: 0 % (ref 0.0–0.2)

## 2021-03-26 LAB — BRAIN NATRIURETIC PEPTIDE: B Natriuretic Peptide: 423.3 pg/mL — ABNORMAL HIGH (ref 0.0–100.0)

## 2021-03-26 LAB — TROPONIN I (HIGH SENSITIVITY): Troponin I (High Sensitivity): 5 ng/L (ref <18)

## 2021-03-26 MED ORDER — GUAIFENESIN 100 MG/5ML PO LIQD
10.0000 mL | Freq: Once | ORAL | Status: AC
  Administered 2021-03-26: 10 mL via ORAL
  Filled 2021-03-26: qty 10

## 2021-03-26 MED ORDER — ALBUTEROL SULFATE HFA 108 (90 BASE) MCG/ACT IN AERS: 1.0000 | INHALATION_SPRAY | RESPIRATORY_TRACT | 0 refills | Status: DC | PRN

## 2021-03-26 MED ORDER — IPRATROPIUM-ALBUTEROL 0.5-2.5 (3) MG/3ML IN SOLN
3.0000 mL | RESPIRATORY_TRACT | Status: DC | PRN
  Administered 2021-03-26: 3 mL via RESPIRATORY_TRACT
  Filled 2021-03-26: qty 3

## 2021-03-26 MED ORDER — PREDNISONE 20 MG PO TABS: 20.0000 mg | ORAL_TABLET | Freq: Every day | ORAL | 0 refills | Status: AC

## 2021-03-26 MED ORDER — METHYLPREDNISOLONE SODIUM SUCC 125 MG IJ SOLR
125.0000 mg | Freq: Once | INTRAMUSCULAR | Status: AC
  Administered 2021-03-26: 125 mg via INTRAVENOUS
  Filled 2021-03-26: qty 2

## 2021-03-26 NOTE — ED Provider Notes (Signed)
[facility] Provider Note   CSN: [id number] Arrival date & time: 03/26/21  2120     History  Chief Complaint  Patient presents with   Shortness of Breath    Casey Rowe is a 71 y.o. female.  Pt is a 71 yo female with pmh of seasonal allergies and asthma presenting for sob and cough. Patient admits to difficulty breathing and wheezing that started tonight. Admits to one month hx of dry cough. Denies fevers, chills, nasal congestion, sore throat, or rhinorrhea.   The history is provided by the patient. No language interpreter was used.  Shortness of Breath Associated symptoms: wheezing   Associated symptoms: no abdominal pain, no chest pain, no cough, no ear pain, no fever, no rash, no sore throat and no vomiting       Home Medications Prior to Admission medications   Medication Sig Start Date End Date Taking? Authorizing Provider  albuterol (VENTOLIN HFA) 108 (90 Base) MCG/ACT inhaler INHALE 2 PUFFS INTO THE LUNGS EVERY 6 HOURS AS NEEDED FOR WHEEZING 11/17/20   Mosie Lukes, MD  ALPRAZolam Duanne Moron) 0.25 MG tablet Take 0.25 mg by mouth daily.     [provider]  azelastine (ASTELIN) 0.1 % nasal spray Place 2 sprays into both nostrils 2 (two) times daily. Use in each nostril as directed 03/16/16   Mosie Lukes, MD  benzonatate (TESSALON) 100 MG capsule Take 1-2 capsules (100-200 mg total) by mouth 3 (three) times daily as needed for cough. 03/19/21   Mosie Lukes, MD  buPROPion (WELLBUTRIN XL) 300 MG 24 hr tablet Take 300 mg by mouth daily.    [provider]  busPIRone (BUSPAR) 5 MG tablet Take 5 mg by mouth 3 (three) times daily.     [provider]  estradiol (ESTRACE) 1 MG tablet Take 1 tablet (1 mg total) by mouth daily. 01/28/21   Truett Mainland, DO  fenofibrate micronized (LOFIBRA) 134 MG capsule Take 1 capsule (134 mg total) by mouth daily before breakfast. 11/17/20 11/12/21  Mosie Lukes, MD   fluticasone-salmeterol (ADVAIR) 100-50 MCG/ACT AEPB Inhale 1 puff into the lungs 2 (two) times daily. 01/30/21   Mosie Lukes, MD  influenza vaccine adjuvanted (FLUAD) 0.5 ML injection Inject into the muscle. Patient not taking: Reported on 03/16/2021 11/14/20   Carlyle Basques, MD  losartan (COZAAR) 50 MG tablet TAKE ONE TABLET BY MOUTH DAILY 03/06/21   Mosie Lukes, MD  methylPREDNISolone (MEDROL) 4 MG tablet 5 tabs po x 1 day then 4 tabs po x 1 day then 3 tabs po x 1 day then 2 tabs po x 1 day then 1 tab po x 1 day and stop 12/15/20   Mosie Lukes, MD  metoprolol succinate (TOPROL-XL) 50 MG 24 hr tablet TAKE ONE TABLET BY MOUTH ONE TIME DAILY WITH A MEAL 02/24/21   Mosie Lukes, MD  METRONIDAZOLE, TOPICAL, 0.75 % LOTN Apply 1 Dose topically daily. Patient not taking: Reported on 03/16/2021 03/17/20   Mosie Lukes, MD  progesterone (PROMETRIUM) 100 MG capsule Take 1 capsule (100 mg total) by mouth daily. 01/28/21   Truett Mainland, DO  saccharomyces boulardii (FLORASTOR) 250 MG capsule Take 250 mg by mouth daily.    [provider]  Tdap Durwin Reges) 5-2.5-18.5 LF-MCG/0.5 injection Inject into the muscle. Patient not taking: Reported on 03/16/2021 08/14/20   Carlyle Basques, MD  Zoster Vaccine Adjuvanted [facility]) injection Inject into the muscle. Patient not  taking: Reported on 03/16/2021 08/14/20   Carlyle Basques, MD  Zoster Vaccine Adjuvanted [facility]) injection Inject into the muscle. Patient not taking: Reported on 03/16/2021 11/14/20   Carlyle Basques, MD      Allergies    Demerol, Ibuprofen, Singulair [montelukast], and Augmentin [amoxicillin-pot clavulanate]    Review of Systems   Review of Systems  Constitutional:  Negative for chills and fever.  HENT:  Negative for ear pain and sore throat.   Eyes:  Negative for pain and visual disturbance.  Respiratory:  Positive for shortness of breath and wheezing. Negative for cough.   Cardiovascular:  Negative for chest pain and  palpitations.  Gastrointestinal:  Negative for abdominal pain and vomiting.  Genitourinary:  Negative for dysuria and hematuria.  Musculoskeletal:  Negative for arthralgias and back pain.  Skin:  Negative for color change and rash.  Neurological:  Negative for seizures and syncope.  All other systems reviewed and are negative.  Physical Exam Updated Vital Signs BP (!) 147/91 (BP Location: Right Arm)    Pulse 93    Temp 98.4 F (36.9 C) (Oral)    Resp (!) 26    Ht 5\' 4"  (1.626 m)    Wt 54 kg    LMP 02/08/1997    SpO2 92%    BMI 20.43 kg/m  Physical Exam Vitals and nursing note reviewed.  Constitutional:      General: She is not in acute distress.    Appearance: She is well-developed.  HENT:     Head: Normocephalic and atraumatic.  Eyes:     Conjunctiva/sclera: Conjunctivae normal.  Cardiovascular:     Rate and Rhythm: Normal rate and regular rhythm.     Heart sounds: No murmur heard. Pulmonary:     Effort: Pulmonary effort is normal. No respiratory distress.     Breath sounds: Normal breath sounds.  Abdominal:     Palpations: Abdomen is soft.     Tenderness: There is no abdominal tenderness.  Musculoskeletal:        General: No swelling.     Cervical back: Neck supple.  Skin:    General: Skin is warm and dry.     Capillary Refill: Capillary refill takes less than 2 seconds.  Neurological:     Mental Status: She is alert.  Psychiatric:        Mood and Affect: Mood normal.    ED Results / Procedures / Treatments   Labs (all labs ordered are listed, but only abnormal results are displayed) Labs Reviewed  RESP PANEL BY RT-PCR (FLU A&B, COVID) ARPGX2    EKG None  Radiology No results found.  Procedures Procedures    Medications Ordered in ED Medications - No data to display  ED Course/ Medical Decision Making/ A&P                           Medical Decision Making Amount and/or Complexity of Data Reviewed Labs: ordered. Radiology: ordered.  Risk OTC  drugs. Prescription drug management.   12:11 AM 71 yo female with pmh of seasonal allergies and asthma presenting for sob and cough. Pt is Aox3, no acute distress, afebrile, with stable vitals. NO hypoxia.  Physical exam demonstrates wheezing in all lung fields. Duonebs given x 3, solumedrol given. Concern for asthma versus copd exacerbation. Doxycyline given.   Stable ECG with no ST segment elevation or depression. Troponin wnl. CXR demonstrates no acute process.   Patient in no distress  and overall condition improved here in the ED. Symptoms likely secondary to asthma exacerbation. Patient discharged home with prescription for albuterol and prednisone.  Detailed discussions were had with the patient regarding current findings, and need for close f/u with PCP or on call doctor. The patient has been instructed to return immediately if the symptoms worsen in any way for re-evaluation. Patient verbalized understanding and is in agreement with current care plan. All questions answered prior to discharge.         Final Clinical Impression(s) / ED Diagnoses Final diagnoses:  Exacerbation of asthma, unspecified asthma severity, unspecified whether persistent    Rx / DC Orders ED Discharge Orders     None         Lianne Cure, DO 49/49/44 0013

## 2021-03-26 NOTE — Progress Notes (Addendum)
Patient's BBS are clear but patient is still breathing shallow and feels SOB.

## 2021-03-26 NOTE — ED Triage Notes (Signed)
Pt has had cough for a month and tonight coughing is worse with shob.

## 2021-03-27 ENCOUNTER — Encounter: Payer: Self-pay | Admitting: Family Medicine

## 2021-03-27 ENCOUNTER — Other Ambulatory Visit: Payer: Self-pay

## 2021-03-27 DIAGNOSIS — U071 COVID-19: Secondary | ICD-10-CM

## 2021-03-27 LAB — RESP PANEL BY RT-PCR (FLU A&B, COVID) ARPGX2
Influenza A by PCR: NEGATIVE
Influenza B by PCR: NEGATIVE
SARS Coronavirus 2 by RT PCR: POSITIVE — AB

## 2021-03-27 NOTE — Telephone Encounter (Signed)
Referral placed.

## 2021-04-01 ENCOUNTER — Encounter: Payer: Self-pay | Admitting: Family Medicine

## 2021-04-10 ENCOUNTER — Encounter: Payer: Self-pay | Admitting: *Deleted

## 2021-05-01 ENCOUNTER — Institutional Professional Consult (permissible substitution): Payer: Medicare PPO | Admitting: Student

## 2021-05-12 ENCOUNTER — Ambulatory Visit: Payer: Medicare PPO | Admitting: Pulmonary Disease

## 2021-05-12 ENCOUNTER — Encounter: Payer: Self-pay | Admitting: Pulmonary Disease

## 2021-05-12 VITALS — BP 112/72 | HR 63 | Temp 98.3°F | Ht 64.5 in | Wt 118.2 lb

## 2021-05-12 DIAGNOSIS — R627 Adult failure to thrive: Secondary | ICD-10-CM

## 2021-05-12 DIAGNOSIS — D860 Sarcoidosis of lung: Secondary | ICD-10-CM

## 2021-05-12 DIAGNOSIS — R197 Diarrhea, unspecified: Secondary | ICD-10-CM | POA: Diagnosis not present

## 2021-05-12 DIAGNOSIS — R634 Abnormal weight loss: Secondary | ICD-10-CM

## 2021-05-12 DIAGNOSIS — Z0000 Encounter for general adult medical examination without abnormal findings: Secondary | ICD-10-CM | POA: Diagnosis not present

## 2021-05-12 DIAGNOSIS — J45901 Unspecified asthma with (acute) exacerbation: Secondary | ICD-10-CM | POA: Diagnosis not present

## 2021-05-12 MED ORDER — PREDNISONE 10 MG PO TABS: ORAL_TABLET | ORAL | 0 refills | Status: AC

## 2021-05-12 MED ORDER — FLUTICASONE-SALMETEROL 100-50 MCG/ACT IN AEPB: 1.0000 | INHALATION_SPRAY | Freq: Two times a day (BID) | RESPIRATORY_TRACT | 5 refills | Status: DC

## 2021-05-12 MED ORDER — ALBUTEROL SULFATE HFA 108 (90 BASE) MCG/ACT IN AERS: INHALATION_SPRAY | RESPIRATORY_TRACT | 5 refills | Status: DC

## 2021-05-12 NOTE — Patient Instructions (Addendum)
Asthma exacerbation ?History of COVID  ?START prednisone taper ?START Advair 100-50 mcg ONE puff TWICE a day ?START Albuterol AS NEEDED for shortness of breath or wheezing ?Unable to tolerate Singulair ? ?Weight loss, diarrhea, FTT ?Consider PET/CT in the future if refractory to above treatment ? ?Follow-up with me in 1 month ?

## 2021-05-12 NOTE — Progress Notes (Signed)
? ? ?Subjective:  ? ?PATIENT ID: Casey Rowe GENDER: female DOB: [birth year], MRN: [id number] ? ? ?HPI ? ?Chief Complaint  ?Patient presents with  ? Consult  ?  Sarcoid  ?Lot of mucus and coughing  ? ? ?Reason for Visit: New consult for sarcoid, asthma ? ?Casey Rowe is a 71 year old female never smoker with sarcoidosis, asthma, HTN, anxiety, morton's neuroma who presents for consultation for cough. ? ?She was previously seen by Pulmonary and diagnosed with sarcoidosis via lung biopsy in the 1990s. On PRN low-dose Advair for her asthma. ? ?She reports chronic cough that begin in October 2022. Has worsened in the last 2.5 months. She was diagnosed with COVID, Kraken variant, in Jan 2023. She had had persistent productive cough. Sputum production has improved Tried tessalon perles. She was seen in [place] ED for wheezing, cough, and shortness of breath for asthma exacerbation in Feb 2023.  ? ?Heat, laying on her back will worsen her cough. Robitussin improves her cough. Denies chest tightness. Works out 3-4 times a week on cardio machine. In the last year she reports 20 lb of unintentional weight loss. Thyroid levels normal. Previously evaluated for diarrhea. ? 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 ?I have personally reviewed patient's past medical/family/social history, allergies, current medications. ? ?Past Medical History:  ?Diagnosis Date  ? Allergy   ? Anxiety   ? Asthma   ? Broken jaw ([place]) 1980s  ? Depression   ? Depression with anxiety   ? Hypertension   ? Preventative health care 03/16/2015  ? Sarcoidosis   ? Tubal pregnancy 1981  ?  ? ?Family History  ?Problem Relation Age of Onset  ? Hypertension Mother   ? Dementia Mother   ? Diabetes Mother   ? Diabetes Father   ? Heart attack Father   ? Heart disease Father   ? Psoriasis Father   ? Cancer Sister   ?     pancreatic  ? Alcohol abuse Sister   ?     substance  ? Hepatitis B Brother   ? Gout Brother   ?  Hypertension Brother   ? Rashes / Skin problems Son   ? Other Son   ?     bad allergies, aspirin, dermatitis  ? Rashes / Skin problems Son   ? Cancer Maternal Aunt   ?     breast  ? Breast cancer Maternal Aunt   ?  ? ?Social History  ? ?Occupational History  ? Not on file  ?Tobacco Use  ? Smoking status: Never  ? Smokeless tobacco: Never  ?Substance and Sexual Activity  ? Alcohol use: Yes  ? Drug use: No  ? Sexual activity: Yes  ?  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 ? ?Allergies  ?Allergen Reactions  ? Demerol Nausea And Vomiting  ? Ibuprofen Other (See Comments)  ?  bruises  ? Singulair [Montelukast]   ?  Dry mouth/dehydration  ? Augmentin [Amoxicillin-Pot Clavulanate] Diarrhea  ?  Caused Diarrhea  ?  ? ?Outpatient Medications Prior to Visit  ?Medication Sig Dispense Refill  ? albuterol (VENTOLIN HFA) 108 (90 Base) MCG/ACT inhaler INHALE 2 PUFFS INTO THE LUNGS EVERY 6 HOURS AS NEEDED FOR WHEEZING 18 g 5  ? ALPRAZolam (XANAX) 0.25 MG tablet Take 0.25 mg by mouth daily.     ? buPROPion (WELLBUTRIN XL) 300 MG 24 hr tablet Take 300 mg  by mouth daily.    ? busPIRone (BUSPAR) 5 MG tablet Take 5 mg by mouth 3 (three) times daily.     ? estradiol (ESTRACE) 1 MG tablet Take 1 tablet (1 mg total) by mouth daily. 90 tablet 1  ? fenofibrate micronized (LOFIBRA) 134 MG capsule Take 1 capsule (134 mg total) by mouth daily before breakfast. 30 capsule 11  ? losartan (COZAAR) 50 MG tablet TAKE ONE TABLET BY MOUTH DAILY 90 tablet 1  ? methylPREDNISolone (MEDROL) 4 MG tablet 5 tabs po x 1 day then 4 tabs po x 1 day then 3 tabs po x 1 day then 2 tabs po x 1 day then 1 tab po x 1 day and stop 15 tablet 0  ? metoprolol succinate (TOPROL-XL) 50 MG 24 hr tablet TAKE ONE TABLET BY MOUTH ONE TIME DAILY WITH A MEAL 90 tablet 1  ? progesterone (PROMETRIUM) 100 MG capsule Take 1 capsule (100 mg total) by mouth daily. 90 capsule 1  ? albuterol (VENTOLIN HFA) 108 (90 Base) MCG/ACT inhaler  Inhale 1-2 puffs into the lungs every 4 (four) hours as needed for wheezing or shortness of breath. 1 each 0  ? azelastine (ASTELIN) 0.1 % nasal spray Place 2 sprays into both nostrils 2 (two) times daily. Use in each nostril as directed 30 mL 12  ? benzonatate (TESSALON) 100 MG capsule Take 1-2 capsules (100-200 mg total) by mouth 3 (three) times daily as needed for cough. (Patient not taking: Reported on 05/12/2021) 40 capsule 1  ? fluticasone-salmeterol (ADVAIR) 100-50 MCG/ACT AEPB Inhale 1 puff into the lungs 2 (two) times daily. (Patient not taking: Reported on 05/12/2021) 60 each 5  ? influenza vaccine adjuvanted (FLUAD) 0.5 ML injection Inject into the muscle. (Patient not taking: Reported on 03/16/2021) 0.5 mL 0  ? METRONIDAZOLE, TOPICAL, 0.75 % LOTN Apply 1 Dose topically daily. (Patient not taking: Reported on 03/16/2021) 59 mL 3  ? saccharomyces boulardii (FLORASTOR) 250 MG capsule Take 250 mg by mouth daily. (Patient not taking: Reported on 05/12/2021)    ? Tdap (BOOSTRIX) 5-2.5-18.5 LF-MCG/0.5 injection Inject into the muscle. (Patient not taking: Reported on 03/16/2021) 0.5 mL 0  ? Zoster Vaccine Adjuvanted [facility]) injection Inject into the muscle. (Patient not taking: Reported on 03/16/2021) 1 each 0  ? Zoster Vaccine Adjuvanted [facility]) injection Inject into the muscle. (Patient not taking: Reported on 03/16/2021) 1 each 0  ? ?No facility-administered medications prior to visit.  ? ? ?Review of Systems  ?Constitutional:  Positive for malaise/fatigue and weight loss. Negative for chills, diaphoresis and fever.  ?HENT:  Negative for congestion.   ?Respiratory:  Positive for cough and sputum production. Negative for hemoptysis, shortness of breath and wheezing.   ?Cardiovascular:  Negative for chest pain, palpitations and leg swelling.  ? ? ?Objective:  ? ?Vitals:  ? 05/12/21 1411  ?BP: 112/72  ?Pulse: 63  ?Temp: 98.3 ?F (36.8 ?C)  ?TempSrc: Oral  ?SpO2: 100%  ?Weight: 118 lb 3.2 oz (53.6 kg)  ?Height: 5' 4.5"  (1.638 m)  ? ?SpO2: 100 % ?O2 Device: None (Room air) ? ?Physical Exam: ?General: Well-appearing, no acute distress ?HENT: [place], AT ?Eyes: EOMI, no scleral icterus ?Respiratory: Clear to auscultation bilaterally.  No crackles, wheezing or rales ?Cardiovascular: RRR, -M/R/G, no JVD ?Extremities:-Edema,-tenderness ?Neuro: AAO x4, CNII-XII grossly intact ?Psych: Normal mood, normal affect ? ?Data Reviewed: ? ?Imaging: ?CXR 03/26/21 - No active issues. No hilar adenopathy ? ?PFT: ?None on file ? ?Labs: ?CBC ?   ?  Component Value Date/Time  ? WBC 6.2 03/26/2021 2202  ? RBC 4.05 03/26/2021 2202  ? HGB 13.6 03/26/2021 2202  ? HCT 40.1 03/26/2021 2202  ? PLT 260 03/26/2021 2202  ? MCV 99.0 03/26/2021 2202  ? MCH 33.6 03/26/2021 2202  ? MCHC 33.9 03/26/2021 2202  ? RDW 12.0 03/26/2021 2202  ? LYMPHSABS 2.0 03/26/2021 2202  ? MONOABS 0.6 03/26/2021 2202  ? EOSABS 0.7 (H) 03/26/2021 2202  ? BASOSABS 0.1 03/26/2021 2202  ? ?Absolute eos 03/26/21 -700 ?   ?Assessment & Plan:  ? ?Discussion: ?71 year old female never smoker with sarcoidosis, asthma, HTN, anxiety, morton's neuroma who presents for management of cough. Discussed symptoms of uncontrolled asthma vs sarcoidosis. Her sarcoid has been reported quiescent since 16s. She is not on maintenance inhalers for her asthma. After discussion will start BID Advair and treat for asthma exacerbation. If symptoms not improved by next visit, will consider PET/CT for sarcoid +/- occult malignancy. ? ?Asthma exacerbation ?START prednisone taper ?START Advair 100-50 mcg ONE puff TWICE a day ?START Albuterol AS NEEDED for shortness of breath or wheezing ?Unable to tolerate Singulair ? ?Weight loss, diarrhea, FTT ?Consider PET/CT in the future if refractory to above treatment ? 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 ?Sarcoid Monitoring ?--Recent chest imaging reviewed.  ?--Annual PFTs.  Due ?--Annual ophthalmology exam.  Discuss at next visit ?--EKG and TTE due ? ?Health  Maintenance ? ?Immunization History  ?Administered Date(s) Administered  ? DTaP 08/09/2010  ? Fluad Quad(high Dose 65+) 01/17/2020, 11/14/2020  ? Influenza Split 02/22/2011  ? Influenza, High Dose Seasonal PF 04/22/2018

## 2021-06-03 NOTE — Progress Notes (Deleted)
? ?Subjective:  ? ? Patient ID: Casey Rowe, female    DOB: 11/05/1950, 71 y.o.   MRN: [id number] ? ?No chief complaint on file. ? ? ?HPI ?Patient is in today for a follow up. ? ?Past Medical History:  ?Diagnosis Date  ? Allergy   ? Anxiety   ? Asthma   ? Broken jaw ([place]) 1980s  ? Depression   ? Depression with anxiety   ? Hypertension   ? Preventative health care 03/16/2015  ? Sarcoidosis   ? Tubal pregnancy 1981  ? ? ?Past Surgical History:  ?Procedure Laterality Date  ? ABDOMINAL HYSTERECTOMY    ? CESAREAN SECTION    ? [place]  ? FRACTURE SURGERY  1980s  ? broken jaw  ? ? ?Family History  ?Problem Relation Age of Onset  ? Hypertension Mother   ? Dementia Mother   ? Diabetes Mother   ? Diabetes Father   ? Heart attack Father   ? Heart disease Father   ? Psoriasis Father   ? Cancer Sister   ?     pancreatic  ? Alcohol abuse Sister   ?     substance  ? Hepatitis B Brother   ? Gout Brother   ? Hypertension Brother   ? Rashes / Skin problems Son   ? Other Son   ?     bad allergies, aspirin, dermatitis  ? Rashes / Skin problems Son   ? Cancer Maternal Aunt   ?     breast  ? Breast cancer Maternal Aunt   ? ? ?Social History  ? ?Socioeconomic History  ? Marital status: Married  ?  Spouse name: Not on file  ? Number of children: Not on file  ? Years of education: Not on file  ? Highest education level: Not on file  ?Occupational History  ? Not on file  ?Tobacco Use  ? Smoking status: Never  ? Smokeless tobacco: Never  ?Substance and Sexual Activity  ? Alcohol use: Yes  ? Drug use: No  ? Sexual activity: Yes  ?  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 Not on file  ?Social History Narrative  ? Works as a Pharmacist, 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  ? Married  ? Grown children- 3  ?   ?   ? ?Social Determinants of Health  ? ?Financial Resource Strain: Not on file  ?Food Insecurity: Not on file  ?Transportation Needs: Not on file  ?Physical  Activity: Not on file  ?Stress: Not on file  ?Social Connections: Not on file  ?Intimate Partner Violence: Not on file  ? ? ?Outpatient Medications Prior to Visit  ?Medication Sig Dispense Refill  ? albuterol (VENTOLIN HFA) 108 (90 Base) MCG/ACT inhaler Inhale 1-2 puffs into the lungs every 4 (four) hours as needed for wheezing or shortness of breath. 1 each 0  ? albuterol (VENTOLIN HFA) 108 (90 Base) MCG/ACT inhaler INHALE 2 PUFFS INTO THE LUNGS EVERY 6 HOURS AS NEEDED FOR WHEEZING 18 g 5  ? ALPRAZolam (XANAX) 0.25 MG tablet Take 0.25 mg by mouth daily.     ? azelastine (ASTELIN) 0.1 % nasal spray Place 2 sprays into both nostrils 2 (two) times daily. Use in each nostril as directed 30 mL 12  ? benzonatate (TESSALON) 100 MG capsule Take 1-2 capsules (100-200 mg total) by mouth 3 (three) times daily as needed for cough. (  Patient not taking: Reported on 05/12/2021) 40 capsule 1  ? buPROPion (WELLBUTRIN XL) 300 MG 24 hr tablet Take 300 mg by mouth daily.    ? busPIRone (BUSPAR) 5 MG tablet Take 5 mg by mouth 3 (three) times daily.     ? estradiol (ESTRACE) 1 MG tablet Take 1 tablet (1 mg total) by mouth daily. 90 tablet 1  ? fenofibrate micronized (LOFIBRA) 134 MG capsule Take 1 capsule (134 mg total) by mouth daily before breakfast. 30 capsule 11  ? fluticasone-salmeterol (ADVAIR) 100-50 MCG/ACT AEPB Inhale 1 puff into the lungs 2 (two) times daily. 60 each 5  ? influenza vaccine adjuvanted (FLUAD) 0.5 ML injection Inject into the muscle. (Patient not taking: Reported on 03/16/2021) 0.5 mL 0  ? losartan (COZAAR) 50 MG tablet TAKE ONE TABLET BY MOUTH DAILY 90 tablet 1  ? metoprolol succinate (TOPROL-XL) 50 MG 24 hr tablet TAKE ONE TABLET BY MOUTH ONE TIME DAILY WITH A MEAL 90 tablet 1  ? METRONIDAZOLE, TOPICAL, 0.75 % LOTN Apply 1 Dose topically daily. (Patient not taking: Reported on 03/16/2021) 59 mL 3  ? progesterone (PROMETRIUM) 100 MG capsule Take 1 capsule (100 mg total) by mouth daily. 90 capsule 1  ? saccharomyces  boulardii (FLORASTOR) 250 MG capsule Take 250 mg by mouth daily. (Patient not taking: Reported on 05/12/2021)    ? Tdap (BOOSTRIX) 5-2.5-18.5 LF-MCG/0.5 injection Inject into the muscle. (Patient not taking: Reported on 03/16/2021) 0.5 mL 0  ? Zoster Vaccine Adjuvanted [facility]) injection Inject into the muscle. (Patient not taking: Reported on 03/16/2021) 1 each 0  ? Zoster Vaccine Adjuvanted [facility]) injection Inject into the muscle. (Patient not taking: Reported on 03/16/2021) 1 each 0  ? ?No facility-administered medications prior to visit.  ? ? ?Allergies  ?Allergen Reactions  ? Demerol Nausea And Vomiting  ? Ibuprofen Other (See Comments)  ?  bruises  ? Singulair [Montelukast]   ?  Dry mouth/dehydration  ? Augmentin [Amoxicillin-Pot Clavulanate] Diarrhea  ?  Caused Diarrhea  ? ? ?ROS ? ?   ?Objective:  ?  ?Physical Exam ? ?LMP 02/08/1997  ?Wt Readings from Last 3 Encounters:  ?05/12/21 118 lb 3.2 oz (53.6 kg)  ?03/26/21 119 lb 0.8 oz (54 kg)  ?03/16/21 119 lb (54 kg)  ? ? ?Diabetic Foot Exam - Simple   ?No data filed ?  ? ?Lab Results  ?Component Value Date  ? WBC 6.2 03/26/2021  ? HGB 13.6 03/26/2021  ? HCT 40.1 03/26/2021  ? PLT 260 03/26/2021  ? GLUCOSE 80 02/23/2021  ? CHOL 156 01/05/2021  ? TRIG 173.0 (H) 01/05/2021  ? HDL 54.40 01/05/2021  ? LDLDIRECT 50.0 11/14/2020  ? [place] 67 01/05/2021  ? ALT 12 02/23/2021  ? AST 15 02/23/2021  ? NA 139 02/23/2021  ? K 4.0 02/23/2021  ? CL 105 02/23/2021  ? CREATININE 0.87 02/23/2021  ? BUN 14 02/23/2021  ? CO2 28 02/23/2021  ? TSH 2.15 11/14/2020  ? ? ?Lab Results  ?Component Value Date  ? TSH 2.15 11/14/2020  ? ?Lab Results  ?Component Value Date  ? WBC 6.2 03/26/2021  ? HGB 13.6 03/26/2021  ? HCT 40.1 03/26/2021  ? MCV 99.0 03/26/2021  ? PLT 260 03/26/2021  ? ?Lab Results  ?Component Value Date  ? NA 139 02/23/2021  ? K 4.0 02/23/2021  ? CO2 28 02/23/2021  ? GLUCOSE 80 02/23/2021  ? BUN 14 02/23/2021  ? CREATININE 0.87 02/23/2021  ? BILITOT 0.4 02/23/2021  ?  ALKPHOS  32 (L) 02/23/2021  ? AST 15 02/23/2021  ? ALT 12 02/23/2021  ? PROT 6.2 02/23/2021  ? ALBUMIN 3.9 02/23/2021  ? CALCIUM 9.2 02/23/2021  ? GFR 68.43 02/23/2021  ? ?Lab Results  ?Component Value Date  ? CHOL 156 01/05/2021  ? ?Lab Results  ?Component Value Date  ? HDL 54.40 01/05/2021  ? ?Lab Results  ?Component Value Date  ? [place] 67 01/05/2021  ? ?Lab Results  ?Component Value Date  ? TRIG 173.0 (H) 01/05/2021  ? ?Lab Results  ?Component Value Date  ? CHOLHDL 3 01/05/2021  ? ?No results found for: HGBA1C ? ?   ?Assessment & Plan:  ? ?Problem List Items Addressed This Visit   ?None ? ? ?I am having Casey Rowe maintain her ALPRAZolam, busPIRone, buPROPion, azelastine, METRONIDAZOLE (TOPICAL), saccharomyces boulardii, Tdap, Zoster Vaccine Adjuvanted, influenza vaccine adjuvanted, Zoster Vaccine Adjuvanted, fenofibrate micronized, progesterone, estradiol, metoprolol succinate, losartan, benzonatate, albuterol, fluticasone-salmeterol, and albuterol. ? ?No orders of the defined types were placed in this encounter. ? ? ?

## 2021-06-04 ENCOUNTER — Ambulatory Visit (INDEPENDENT_AMBULATORY_CARE_PROVIDER_SITE_OTHER): Payer: Medicare PPO | Admitting: Family Medicine

## 2021-06-04 ENCOUNTER — Encounter: Payer: Self-pay | Admitting: Family Medicine

## 2021-06-04 VITALS — BP 138/80 | HR 60 | Resp 20 | Ht 64.0 in | Wt 120.8 lb

## 2021-06-04 DIAGNOSIS — I10 Essential (primary) hypertension: Secondary | ICD-10-CM

## 2021-06-04 DIAGNOSIS — J45909 Unspecified asthma, uncomplicated: Secondary | ICD-10-CM

## 2021-06-04 DIAGNOSIS — E785 Hyperlipidemia, unspecified: Secondary | ICD-10-CM

## 2021-06-04 DIAGNOSIS — E2839 Other primary ovarian failure: Secondary | ICD-10-CM

## 2021-06-04 DIAGNOSIS — R634 Abnormal weight loss: Secondary | ICD-10-CM

## 2021-06-04 DIAGNOSIS — F418 Other specified anxiety disorders: Secondary | ICD-10-CM

## 2021-06-04 DIAGNOSIS — R197 Diarrhea, unspecified: Secondary | ICD-10-CM

## 2021-06-04 DIAGNOSIS — D7589 Other specified diseases of blood and blood-forming organs: Secondary | ICD-10-CM
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
## 2021-06-04 NOTE — Patient Instructions (Signed)
Diarrhea, Adult Diarrhea is frequent loose and watery bowel movements. Diarrhea can make you feel weak and cause you to become dehydrated. Dehydration can make you tired and thirsty, cause you to have a dry mouth, and decrease how often you urinate. Diarrhea typically lasts 2-3 days. However, it can last longer if it is a sign of something more serious. It is important to treat your diarrhea as told by your health care provider. Follow these instructions at home: Eating and drinking     Follow these recommendations as told by your health care provider: Take an oral rehydration solution (ORS). This is an over-the-counter medicine that helps return your body to its normal balance of nutrients and water. It is found at pharmacies and retail stores. Drink plenty of fluids, such as water, ice chips, diluted fruit juice, and low-calorie sports drinks. You can drink milk also, if desired. Avoid drinking fluids that contain a lot of sugar or caffeine, such as energy drinks, sports drinks, and soda. Eat bland, easy-to-digest foods in small amounts as you are able. These foods include bananas, applesauce, rice, lean meats, toast, and crackers. Avoid alcohol. Avoid spicy or fatty foods.  Medicines Take over-the-counter and prescription medicines only as told by your health care provider. If you were prescribed an antibiotic medicine, take it as told by your health care provider. Do not stop using the antibiotic even if you start to feel better. General instructions  Wash your hands often using soap and water. If soap and water are not available, use a hand sanitizer. Others in the household should wash their hands as well. Hands should be washed: After using the toilet or changing a diaper. Before preparing, cooking, or serving food. While caring for a sick person or while visiting someone in a hospital. Drink enough fluid to keep your urine pale yellow. Rest at home while you recover. Watch your  condition for any changes. Take a warm bath to relieve any burning or pain from frequent diarrhea episodes. Keep all follow-up visits as told by your health care provider. This is important. Contact a health care provider if: You have a fever. Your diarrhea gets worse. You have new symptoms. You cannot keep fluids down. You feel light-headed or dizzy. You have a headache. You have muscle cramps. Get help right away if: You have chest pain. You feel extremely weak or you faint. You have bloody or black stools or stools that look like tar. You have severe pain, cramping, or bloating in your abdomen. You have trouble breathing or you are breathing very quickly. Your heart is beating very quickly. Your skin feels cold and clammy. You feel confused. You have signs of dehydration, such as: Dark urine, very little urine, or no urine. Cracked lips. Dry mouth. Sunken eyes. Sleepiness. Weakness. Summary Diarrhea is frequent loose and sometimes watery bowel movements. Diarrhea can make you feel weak and cause you to become dehydrated. Drink enough fluids to keep your urine pale yellow. Make sure that you wash your hands after using the toilet. If soap and water are not available, use hand sanitizer. Contact a health care provider if your diarrhea gets worse or you have new symptoms. Get help right away if you have signs of dehydration. This information is not intended to replace advice given to you by your health care provider. Make sure you discuss any questions you have with your health care provider. Document Revised: 08/06/2020 Document Reviewed: 08/06/2020 Elsevier Patient Education  2023 Elsevier Inc.  

## 2021-06-07 NOTE — Assessment & Plan Note (Signed)
She has managed to stabilize her weight and gain a small amount, continue calorie dense foods with high levels of protein ?

## 2021-06-07 NOTE — Assessment & Plan Note (Addendum)
Cough is improving some but still present, sees pulmonology soon.  ?

## 2021-06-07 NOTE — Assessment & Plan Note (Signed)
Improved some and work up was unremarkable. Consider referral to [facility] if symptoms worsen again, avoid rich and offending foods.  ?

## 2021-06-07 NOTE — Assessment & Plan Note (Signed)
Well controlled, no changes to meds. Encouraged heart healthy diet such as the DASH diet and exercise as tolerated.  °

## 2021-06-11 ENCOUNTER — Encounter: Payer: Self-pay | Admitting: Pulmonary Disease

## 2021-06-11 ENCOUNTER — Ambulatory Visit: Payer: Medicare PPO | Admitting: Pulmonary Disease

## 2021-06-11 VITALS — BP 124/62 | HR 67 | Temp 98.1°F | Ht 64.0 in | Wt 120.6 lb

## 2021-06-11 DIAGNOSIS — J4540 Moderate persistent asthma, uncomplicated: Secondary | ICD-10-CM

## 2021-06-11 DIAGNOSIS — J4541 Moderate persistent asthma with (acute) exacerbation: Secondary | ICD-10-CM | POA: Insufficient documentation

## 2021-06-11 HISTORY — DX: Moderate persistent asthma, uncomplicated: J45.40

## 2021-06-11 MED ORDER — FLUTICASONE-SALMETEROL 250-50 MCG/ACT IN AEPB
1.0000 | INHALATION_SPRAY | Freq: Two times a day (BID) | RESPIRATORY_TRACT | 5 refills | Status: DC
Start: 2021-06-11 — End: 2022-01-04

## 2021-06-11 MED ORDER — PREDNISONE 10 MG PO TABS: ORAL_TABLET | ORAL | 0 refills | Status: AC

## 2021-06-11 NOTE — Patient Instructions (Addendum)
Asthma  ?ORDER pulmonary function tests  ?CONTINUE Advair 250-50 mcg ONE puff TWICE a day ?CONTINUE Advair 100-50 ONE puff AS NEEDED for shortness of breath or wheezing ?CONTINUE Albuterol AS NEEDED for shortness of breath or wheezing ?Unable to tolerate Singulair ? ?Weight loss, diarrhea, FTT - weight stabilized for now ?Consider PET/CT in the future if refractory to above treatment ? ?Dizziness, diaphoresis ?Recommend discussing PCP and logging how frequent episodes ?I recommend sitting down when these episodes occur and drinking juice if available ? ?Follow-up with me in July or August with PFTs prior to visit ?

## 2021-06-11 NOTE — Progress Notes (Signed)
? ? ?Subjective:  ? ?PATIENT ID: Casey Rowe GENDER: female DOB: 13-Dec-1950, MRN: [id number] ? ? ?HPI ? ?Chief Complaint  ?Patient presents with  ? Follow-up  ?  Pred taper great.  Still productive cough but has gotten a bit lighter. Coughing increased with reclining.No wheezing or SOB  ? ? ?Reason for Visit: Follow-up ? ?Ms. Danea Manter is a 71 year old female never smoker with sarcoidosis, asthma, HTN, anxiety, morton's neuroma who for follow-up. ? ?Synopsis: ?She was previously seen by Pulmonary and diagnosed with sarcoidosis via lung biopsy in the 1990s. On PRN low-dose Advair for her asthma. ?She reports chronic cough that begin in October 2022. Has worsened in the last 2.5 months. She was diagnosed with COVID, [place] variant, in Jan 2023. She had had persistent productive cough. Sputum production has improved Tried tessalon perles. She was seen in [place] ED for wheezing, cough, and shortness of breath for asthma exacerbation in Feb 2023.  ?Heat, laying on her back will worsen her cough. Robitussin improves her cough. Denies chest tightness. Works out 3-4 times a week on cardio machine. In the last year she reports 20 lb of unintentional weight loss. Thyroid levels normal. Previously evaluated for diarrhea. ? ?06/11/21 ?Since our last visit she was treated for asthma exacerbation with prednisone and prescribed Advair. With the steroid, she reports productive coughing and wheezing resolved. Shortness of breath has improved. Has to use robitussin frequently. Weight has been stabilizing. She reports occasional episodes of diaphoresis a few weeks ago. State her diet could be better ? 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 ?Past Medical History:  ?Diagnosis Date  ? Allergy   ? Anxiety   ? Asthma   ? Broken jaw ([place]) 1980s  ? Depression   ? Depression with anxiety   ? Hypertension   ? Preventative health care 03/16/2015  ? Sarcoidosis   ? Tubal pregnancy 1981  ?  ? ?Family  History  ?Problem Relation Age of Onset  ? Hypertension Mother   ? Dementia Mother   ? Diabetes Mother   ? Diabetes Father   ? Heart attack Father   ? Heart disease Father   ? Psoriasis Father   ? Cancer Sister   ?     pancreatic  ? Alcohol abuse Sister   ?     substance  ? Hepatitis B Brother   ? Gout Brother   ? Hypertension Brother   ? Rashes / Skin problems Son   ? Other Son   ?     bad allergies, aspirin, dermatitis  ? Rashes / Skin problems Son   ? Cancer Maternal Aunt   ?     breast  ? Breast cancer Maternal Aunt   ?  ? ?Social History  ? ?Occupational History  ? Not on file  ?Tobacco Use  ? Smoking status: Never  ? Smokeless tobacco: Never  ?Substance and Sexual Activity  ? Alcohol use: Yes  ? Drug use: No  ? Sexual activity: Yes  ?  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 ? ?Allergies  ?Allergen Reactions  ? Demerol Nausea And Vomiting  ? Singulair [Montelukast]   ?  Dry mouth/dehydration  ? Augmentin [Amoxicillin-Pot Clavulanate] Diarrhea  ?  Caused Diarrhea  ?  ? ?Outpatient Medications Prior to Visit  ?Medication Sig Dispense Refill  ? albuterol (VENTOLIN HFA) 108 (90 Base) MCG/ACT inhaler Inhale 1-2 puffs into the  lungs every 4 (four) hours as needed for wheezing or shortness of breath. 1 each 0  ? albuterol (VENTOLIN HFA) 108 (90 Base) MCG/ACT inhaler INHALE 2 PUFFS INTO THE LUNGS EVERY 6 HOURS AS NEEDED FOR WHEEZING 18 g 5  ? ALPRAZolam (XANAX) 0.25 MG tablet Take 0.25 mg by mouth daily.     ? azelastine (ASTELIN) 0.1 % nasal spray Place 2 sprays into both nostrils 2 (two) times daily. Use in each nostril as directed 30 mL 12  ? buPROPion (WELLBUTRIN XL) 300 MG 24 hr tablet Take 300 mg by mouth daily.    ? busPIRone (BUSPAR) 5 MG tablet Take 5 mg by mouth 3 (three) times daily.     ? estradiol (ESTRACE) 1 MG tablet Take 1 tablet (1 mg total) by mouth daily. 90 tablet 1  ? fenofibrate micronized (LOFIBRA) 134 MG capsule Take 1 capsule (134 mg total) by  mouth daily before breakfast. 30 capsule 11  ? fluticasone-salmeterol (ADVAIR) 100-50 MCG/ACT AEPB Inhale 1 puff into the lungs 2 (two) times daily. 60 each 5  ? losartan (COZAAR) 50 MG tablet TAKE ONE TABLET BY MOUTH DAILY 90 tablet 1  ? metoprolol succinate (TOPROL-XL) 50 MG 24 hr tablet TAKE ONE TABLET BY MOUTH ONE TIME DAILY WITH A MEAL 90 tablet 1  ? progesterone (PROMETRIUM) 100 MG capsule Take 1 capsule (100 mg total) by mouth daily. 90 capsule 1  ? saccharomyces boulardii (FLORASTOR) 250 MG capsule Take 250 mg by mouth daily.    ? ?No facility-administered medications prior to visit.  ? ? ?Review of Systems  ?Constitutional:  Negative for chills, diaphoresis, fever, malaise/fatigue and weight loss.  ?HENT:  Negative for congestion.   ?Respiratory:  Positive for shortness of breath. Negative for cough, hemoptysis, sputum production and wheezing.   ?Cardiovascular:  Negative for chest pain, palpitations and leg swelling.  ? ? ?Objective:  ? ?Vitals:  ? 06/11/21 1333  ?BP: 124/62  ?Pulse: 67  ?Temp: 98.1 ?F (36.7 ?C)  ?TempSrc: Oral  ?SpO2: 99%  ?Weight: 120 lb 9.6 oz (54.7 kg)  ?Height: '5\' 4"'$  (1.626 m)  ? ?SpO2: 99 % (RA) ? ?Physical Exam: ?General: Well-appearing, no acute distress ?HENT: [place], AT ?Eyes: EOMI, no scleral icterus ?Respiratory: Clear to auscultation bilaterally.  No crackles, wheezing or rales ?Cardiovascular: RRR, -M/R/G, no JVD ?Extremities:-Edema,-tenderness ?Neuro: AAO x4, CNII-XII grossly intact ?Psych: Normal mood, normal affect ? ?Data Reviewed: ? ?Imaging: ?CXR 03/26/21 - No active issues. No hilar adenopathy ? ?PFT: ?None on file ? ?Labs: ?CBC ?   ?Component Value Date/Time  ? WBC 6.2 03/26/2021 2202  ? RBC 4.05 03/26/2021 2202  ? HGB 13.6 03/26/2021 2202  ? HCT 40.1 03/26/2021 2202  ? PLT 260 03/26/2021 2202  ? MCV 99.0 03/26/2021 2202  ? MCH 33.6 03/26/2021 2202  ? MCHC 33.9 03/26/2021 2202  ? RDW 12.0 03/26/2021 2202  ? LYMPHSABS 2.0 03/26/2021 2202  ? MONOABS 0.6 03/26/2021 2202  ?  EOSABS 0.7 (H) 03/26/2021 2202  ? BASOSABS 0.1 03/26/2021 2202  ? ?Absolute eos 03/26/21 -700 ?   ?Assessment & Plan:  ? ?Discussion: ?71 year old female never smoker with sarcoidosis, asthma, HTN, anxiety, morton's neuroma who presents for follow-up for cough. Her sarcoid has been reportedly quiescent since 67s. Symptoms have improved on low-dose ICS/LABA. Will step up therapy and if symptoms not resolved, we can consider PET/CT for sarcoid +/- occult malignancy. Will also plan to evaluate dyspnea with PFTs ? ?Moderate persistent asthma  ?  ORDER pulmonary function tests  ?CONTINUE Advair 250-50 mcg ONE puff TWICE a day ?CONTINUE Advair 100-50 ONE puff AS NEEDED for shortness of breath or wheezing ?CONTINUE Albuterol AS NEEDED for shortness of breath or wheezing ?Unable to tolerate Singulair ? ?Steroid pack as needed for your future trips ? ?Weight loss, diarrhea, FTT - weight stabilized for now ?Consider PET/CT in the future if refractory to above treatment ? ?Dizziness, diaphoresis ?Recommend discussing PCP and logging how frequent episodes ?I recommend sitting down when these episodes occur and drinking juice if available ? 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  ?--Annual PFTs.  Ordered ?--Annual ophthalmology exam. Patient to schedule  ?--EKG and TTE due ? ?Health Maintenance ? ?Immunization History  ?Administered Date(s) Administered  ? DTaP 08/09/2010  ? Fluad Quad(high Dose 65+) 01/17/2020, 11/14/2020  ? Influenza Split 02/22/2011  ? Influenza, High Dose Seasonal PF 04/22/2018, 11/04/2018  ? Influenza,inj,Quad PF,6+ Mos 01/30/2014, 03/11/2015, 03/16/2016  ? PFIZER Comirnaty(Gray Top)Covid-19 Tri-Sucrose Vaccine 05/20/2020  ? PFIZER(Purple Top)SARS-COV-2 Vaccination 03/15/2019, 04/05/2019, 12/05/2019, 10/21/2020  ? Pneumococcal Polysaccharide-23 11/04/2018  ? Tdap 11/04/2018, 08/14/2020, 08/14/2020  ? Zoster Recombinat (Shingrix) 08/14/2020,  11/14/2020  ? Zoster, Live 03/11/2015  ? ?No orders of the defined types were placed in this encounter. ? ?No orders of the defined types were placed in this encounter. ? ? ?No follow-ups on file. ? ?I have spent a

## 2021-07-28 ENCOUNTER — Other Ambulatory Visit: Payer: Self-pay

## 2021-07-28 ENCOUNTER — Encounter: Payer: Self-pay | Admitting: Family Medicine

## 2021-07-30 ENCOUNTER — Other Ambulatory Visit: Payer: Self-pay | Admitting: Family Medicine

## 2021-07-30 DIAGNOSIS — N951 Menopausal and female climacteric states: Secondary | ICD-10-CM

## 2021-07-31 ENCOUNTER — Other Ambulatory Visit: Payer: Self-pay

## 2021-07-31 DIAGNOSIS — N951 Menopausal and female climacteric states: Secondary | ICD-10-CM

## 2021-07-31 MED ORDER — PROGESTERONE MICRONIZED 100 MG PO CAPS: 100.0000 mg | ORAL_CAPSULE | Freq: Every day | ORAL | 1 refills | Status: DC

## 2021-07-31 MED ORDER — ESTRADIOL 1 MG PO TABS: 1.0000 mg | ORAL_TABLET | Freq: Every day | ORAL | 1 refills | Status: DC

## 2021-08-12 DIAGNOSIS — F331 Major depressive disorder, recurrent, moderate: Secondary | ICD-10-CM | POA: Diagnosis not present

## 2021-08-12 DIAGNOSIS — F411 Generalized anxiety disorder: Secondary | ICD-10-CM | POA: Diagnosis not present

## 2021-08-14 ENCOUNTER — Encounter: Payer: Self-pay | Admitting: Family Medicine

## 2021-08-19 ENCOUNTER — Ambulatory Visit (INDEPENDENT_AMBULATORY_CARE_PROVIDER_SITE_OTHER): Payer: Medicare PPO | Admitting: Pulmonary Disease

## 2021-08-19 ENCOUNTER — Encounter: Payer: Self-pay | Admitting: Pulmonary Disease

## 2021-08-19 ENCOUNTER — Ambulatory Visit: Payer: Medicare PPO | Attending: Internal Medicine

## 2021-08-19 ENCOUNTER — Ambulatory Visit: Payer: Medicare PPO | Admitting: Pulmonary Disease

## 2021-08-19 VITALS — BP 92/60 | HR 64 | Temp 97.5°F | Ht 64.0 in | Wt 119.0 lb

## 2021-08-19 DIAGNOSIS — J4540 Moderate persistent asthma, uncomplicated: Secondary | ICD-10-CM | POA: Diagnosis not present

## 2021-08-19 DIAGNOSIS — D869 Sarcoidosis, unspecified: Secondary | ICD-10-CM

## 2021-08-19 DIAGNOSIS — Z23 Encounter for immunization: Secondary | ICD-10-CM

## 2021-08-19 LAB — PULMONARY FUNCTION TEST
DL/VA % pred: 91 %
DL/VA: 3.82 ml/min/mmHg/L
DLCO cor % pred: 91 %
DLCO cor: 17.85 ml/min/mmHg
DLCO unc % pred: 91 %
DLCO unc: 17.96 ml/min/mmHg
FEF 25-75 Post: 2.27 L/sec
FEF 25-75 Pre: 2.28 L/sec
FEF2575-%Change-Post: 0 %
FEF2575-%Pred-Post: 113 %
FEF2575-%Pred-Pre: 114 %
FEV1-%Change-Post: 0 %
FEV1-%Pred-Post: 106 %
FEV1-%Pred-Pre: 105 %
FEV1-Post: 2.48 L
FEV1-Pre: 2.47 L
FEV1FVC-%Change-Post: -4 %
FEV1FVC-%Pred-Pre: 103 %
FEV6-%Change-Post: 5 %
FEV6-%Pred-Post: 111 %
FEV6-%Pred-Pre: 106 %
FEV6-Post: 3.28 L
FEV6-Pre: 3.13 L
FEV6FVC-%Change-Post: 0 %
FEV6FVC-%Pred-Post: 103 %
FEV6FVC-%Pred-Pre: 104 %
FVC-%Change-Post: 5 %
FVC-%Pred-Post: 107 %
FVC-%Pred-Pre: 102 %
FVC-Post: 3.31 L
FVC-Pre: 3.13 L
Post FEV1/FVC ratio: 75 %
Post FEV6/FVC ratio: 99 %
Pre FEV1/FVC ratio: 79 %
Pre FEV6/FVC Ratio: 100 %
RV % pred: 139 %
RV: 3 L
TLC % pred: 117 %
TLC: 5.94 L

## 2021-08-19 NOTE — Patient Instructions (Addendum)
Moderate persistent asthma - improved CONTINUE Advair 250-50 mcg ONE puff TWICE a day. Rinse and gargle mouth CONTINUE Albuterol AS NEEDED for shortness of breath or wheezing Unable to tolerate Singulair  Weight loss, diarrhea, FTT - weight stabilized for now If you have worsening symptoms including additional weight loss, I recommend PET/CT She plans to discuss with her PCP   Follow-up with me in 6 months

## 2021-08-19 NOTE — Progress Notes (Signed)
Subjective:   PATIENT ID: Casey Rowe GENDER: female DOB: 1950/03/21, MRN: [id number]   HPI  Chief Complaint  Patient presents with   Follow-up    Patient did breathing test today and would like to go over that. Patient feels like breathing is good. Advair makes her voice hoarse.     Reason for Visit: Follow-up  [name] is a 71 year old female never smoker with sarcoidosis, asthma, HTN, anxiety, morton's neuroma who for follow-up.  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 On PRN low-dose Advair for her asthma. She reports chronic cough that begin in October 2022. Has worsened in the last 2.5 months. She was diagnosed with COVID, [place] variant, in Jan 2023. She had had persistent productive cough. Sputum production has improved Tried tessalon perles. She was seen in [facility] for wheezing, cough, and shortness of breath for asthma exacerbation in Feb 2023.  Heat, laying on her back will worsen her cough. Robitussin improves her cough. Denies chest tightness. Works out 3-4 times a week on cardio machine. In the last year she reports 20 lb of unintentional weight loss. Thyroid levels normal. Previously evaluated for diarrhea.  06/11/21 Since our last visit she was treated for asthma exacerbation with prednisone and prescribed Advair. With the steroid, she reports productive coughing and wheezing resolved. Shortness of breath has improved. Has to use robitussin frequently. Weight has been stabilizing. She reports occasional episodes of diaphoresis a few weeks ago. State her diet could be better  08/19/21 Cough improves with daily Advair use. Does have some hoarseness. She rinses mouth out. Continues to have diarrhea. Weight is stable between 116-119 lbs. She eats two meals a day and her husband reminds her to eat. She has no appetite.  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  Past Medical History:  Diagnosis Date   Allergy    Anxiety    Asthma    Broken jaw ([place]) 1980s   Depression    Depression with anxiety    Hypertension    Preventative health care 03/16/2015   Sarcoidosis    Tubal pregnancy 1981     Family History  Problem Relation Age of Onset   Hypertension Mother    Dementia Mother    Diabetes Mother    Diabetes Father    Heart attack Father    Heart disease Father    Psoriasis Father    Cancer Sister        pancreatic   Alcohol abuse Sister        substance   Hepatitis B Brother    Gout Brother    Hypertension Brother    Rashes / Skin problems Son    Other Son        bad allergies, aspirin, dermatitis   Rashes / Skin problems Son    Cancer Maternal Aunt        breast   Breast cancer Maternal Aunt      Social History   Occupational History   Not on file  Tobacco Use   Smoking status: Never   Smokeless tobacco: Never  Vaping Use   Vaping Use: Never used  Substance and Sexual Activity   Alcohol use: Yes   Drug use: No   Sexual activity: Yes    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 1 each 0   albuterol (VENTOLIN HFA) 108 (90 Base) MCG/ACT inhaler INHALE 2 PUFFS INTO THE LUNGS EVERY 6 HOURS AS NEEDED FOR WHEEZING 18 g 5   ALPRAZolam (XANAX) 0.25 MG tablet Take 0.25 mg by mouth daily.      azelastine (ASTELIN) 0.1 % nasal spray Place 2 sprays into both nostrils 2 (two) times daily. Use in each nostril as directed 30 mL 12   buPROPion (WELLBUTRIN XL) 300  MG 24 hr tablet Take 300 mg by mouth daily.     busPIRone (BUSPAR) 5 MG tablet Take 5 mg by mouth 3 (three) times daily.      estradiol (ESTRACE) 1 MG tablet TAKE ONE TABLET BY MOUTH DAILY 90 tablet 1   estradiol (ESTRACE) 1 MG tablet Take 1 tablet (1 mg total) by mouth daily. 90 tablet 1   fenofibrate micronized (LOFIBRA) 134 MG capsule Take 1 capsule (134 mg total) by mouth daily before breakfast. 30 capsule 11   fluticasone-salmeterol (ADVAIR DISKUS) 250-50 MCG/ACT AEPB Inhale 1 puff into the lungs in the morning and at bedtime. 60 each 5   losartan (COZAAR) 50 MG tablet TAKE ONE TABLET BY MOUTH DAILY 90 tablet 1   metoprolol succinate (TOPROL-XL) 50 MG 24 hr tablet TAKE ONE TABLET BY MOUTH ONE TIME DAILY WITH A MEAL 90 tablet 1   progesterone (PROMETRIUM) 100 MG capsule TAKE ONE CAPSULE BY MOUTH DAILY 90 capsule 1   progesterone (PROMETRIUM) 100 MG capsule Take 1 capsule (100 mg total) by mouth daily. 90 capsule 1   saccharomyces boulardii (FLORASTOR) 250 MG capsule Take 250 mg by mouth daily.     No facility-administered medications prior to visit.    Review of Systems  Constitutional:  Negative for chills, diaphoresis, fever, malaise/fatigue and weight loss.  HENT:  Negative for congestion.   Respiratory:  Positive for shortness of breath. Negative for cough, hemoptysis, sputum production and wheezing.   Cardiovascular:  Negative for chest pain, palpitations and leg swelling.     Objective:   Vitals:   08/19/21 1420  BP: 92/60  Pulse: 64  Temp: (!) 97.5 F (36.4 C)  TempSrc: Oral  SpO2: 98%  Weight: 119 lb (54 kg)  Height: '5\' 4"'$  (1.626 m)   SpO2: 98 % O2 Device: None (Room air)  Physical Exam: General: Thin, chronically ill-appearing, no acute distress HENT: [place], AT Eyes: EOMI, no scleral icterus Respiratory: Clear to auscultation bilaterally.  No crackles, wheezing or rales Cardiovascular: RRR, -M/R/G, no JVD Extremities:-Edema,-tenderness Neuro: AAO x4, CNII-XII  grossly intact Psych: Normal mood, normal affect  Data Reviewed:  Imaging: CXR 03/26/21 - No active issues. No hilar adenopathy  PFT: 08/19/21 FVC 3.31 (107%) FEV1 2.48 (106%) Ratio 79  TLC 117% RV 139% DLCO 91%.  Interpretation: On Advair. No obstructive or restrictive defect present. No significant BD response however does not preclude bronchodilator benefit. F-V loops suggestive of minimal small airway disease.   Labs: CBC    Component Value Date/Time   WBC 6.2 03/26/2021 2202   RBC 4.05 03/26/2021 2202   HGB 13.6 03/26/2021 2202   HCT  40.1 03/26/2021 2202   PLT 260 03/26/2021 2202   MCV 99.0 03/26/2021 2202   MCH 33.6 03/26/2021 2202   MCHC 33.9 03/26/2021 2202   RDW 12.0 03/26/2021 2202   LYMPHSABS 2.0 03/26/2021 2202   MONOABS 0.6 03/26/2021 2202   EOSABS 0.7 (H) 03/26/2021 2202   BASOSABS 0.1 03/26/2021 2202   Absolute eos 03/26/21 -700    Assessment & Plan:   Discussion: 71 year old female never smoker with sarcoidosis, asthma, HTN, anxiety, morton's neuroma who presents for follow-up for cough. Her sarcoid has been reportedly quiescent since 86s. Symptoms have improved on medium-dose ICS/LABA. PFTs reviewed with no obstructive or restrictive defect on bronchodilators.  Moderate persistent asthma - improved CONTINUE Advair 250-50 mcg ONE puff TWICE a day. Rinse and gargle mouth CONTINUE Albuterol AS NEEDED for shortness of breath or wheezing Unable to tolerate Singulair  Weight loss, diarrhea, FTT - weight stabilized for now If you have worsening symptoms including additional weight loss, I recommend PET/CT She plans to discuss with her PCP   Dizziness, diaphoresis - resolved  Pulmonary sarcoidosis/monitoring --Dx in 1980s via lung biopsy --Recent chest imaging reviewed.  --Annual PFTs.  Next due 08/2022 --Annual ophthalmology exam. Patient to schedule  --EKG today: NSR  Health Maintenance  Immunization History  Administered Date(s) Administered    DTaP 08/09/2010   Fluad Quad(high Dose 65+) 01/17/2020, 11/14/2020   Influenza Split 02/22/2011   Influenza, High Dose Seasonal PF 04/22/2018, 11/04/2018   Influenza,inj,Quad PF,6+ Mos 01/30/2014, 03/11/2015, 03/16/2016   PFIZER Comirnaty(Gray Top)Covid-19 Tri-Sucrose Vaccine 05/20/2020   PFIZER(Purple Top)SARS-COV-2 Vaccination 03/15/2019, 04/05/2019, 12/05/2019, 10/21/2020   Pfizer Covid-19 Vaccine Bivalent Booster 70yr & up 08/19/2021   Pneumococcal Polysaccharide-23 11/04/2018   Tdap 11/04/2018, 08/14/2020, 08/14/2020   Zoster Recombinat (Shingrix) 08/14/2020, 11/14/2020   Zoster, Live 03/11/2015   Orders Placed This Encounter  Procedures   EKG 12-Lead   No orders of the defined types were placed in this encounter.  Return in about 6 months (around 02/19/2022).  I have spent a total time of 34-minutes on the day of the appointment including chart review, data review, collecting history, coordinating care and discussing medical diagnosis and plan with the patient/family. Past medical history, allergies, medications were reviewed. Pertinent imaging, labs and tests included in this note have been reviewed and interpreted independently by me.  [place] MD [facility] 08/19/2021 2:33 PM  Office Number [phone]

## 2021-08-19 NOTE — Progress Notes (Signed)
PFT done today. 

## 2021-08-24 NOTE — Progress Notes (Unsigned)
Subjective:    Patient ID: Casey Rowe, female    DOB: 01/02/1951, 71 y.o.   MRN: [id number]  No chief complaint on file.   HPI Patient is in today for a CPE.  Past Medical History:  Diagnosis Date   Allergy    Anxiety    Asthma    Broken jaw ([place]) 1980s   Depression    Depression with anxiety    Hypertension    Preventative health care 03/16/2015   Sarcoidosis    Tubal pregnancy 1981    Past Surgical History:  Procedure Laterality Date   ABDOMINAL HYSTERECTOMY     [place]   broken jaw    Family History  Problem Relation Age of Onset   Hypertension Mother    Dementia Mother    Diabetes Mother    Diabetes Father    Heart attack Father    Heart disease Father    Psoriasis Father    Cancer Sister        pancreatic   Alcohol abuse Sister        substance   Hepatitis B Brother    Gout Brother    Hypertension Brother    Rashes / Skin problems Son    Other Son        bad allergies, aspirin, dermatitis   Rashes / Skin problems Son    Cancer Maternal Aunt        breast   Breast cancer Maternal Aunt     Social History   Socioeconomic History   Marital status: Married    Spouse name: Not on file   Number of children: Not on file   Years of education: Not on file   Highest education level: Not on file  Occupational History   Not on file  Tobacco Use   Smoking status: Never   Smokeless tobacco: Never  Vaping Use   Vaping Use: Never used  Substance and Sexual Activity   Alcohol use: Yes   Drug use: No   Sexual activity: Yes    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[employer], lives with husband, 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, minimal red meat  Other Topics Concern   Not on file  Social History Narrative   Works as a Pharmacist, 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   Married   Grown children- 3         Social Determinants of Radio broadcast assistant Strain: Not on file  Food Insecurity: Not on file  Transportation  Needs: Not on file  Physical Activity: Not on file  Stress: Not on file  Social Connections: Not on file  Intimate Partner Violence: Not on file    Outpatient Medications Prior to Visit  Medication Sig Dispense Refill   albuterol (VENTOLIN HFA) 108 (90 Base) MCG/ACT inhaler Inhale 1-2 puffs into the lungs every 4 (four) hours as needed for wheezing or shortness of breath. 1 each 0   albuterol (VENTOLIN HFA) 108 (90 Base) MCG/ACT inhaler INHALE 2 PUFFS INTO THE LUNGS EVERY 6 HOURS AS NEEDED FOR WHEEZING 18 g 5   ALPRAZolam (XANAX) 0.25 MG tablet Take 0.25 mg by mouth daily.      azelastine (ASTELIN) 0.1 % nasal spray Place 2 sprays into both nostrils 2 (two) times daily. Use in each nostril as directed 30 mL 12   buPROPion (WELLBUTRIN XL) 300 MG 24 hr tablet Take 300 mg by mouth  daily.     busPIRone (BUSPAR) 5 MG tablet Take 5 mg by mouth 3 (three) times daily.      estradiol (ESTRACE) 1 MG tablet TAKE ONE TABLET BY MOUTH DAILY 90 tablet 1   estradiol (ESTRACE) 1 MG tablet Take 1 tablet (1 mg total) by mouth daily. 90 tablet 1   fenofibrate micronized (LOFIBRA) 134 MG capsule Take 1 capsule (134 mg total) by mouth daily before breakfast. 30 capsule 11   fluticasone-salmeterol (ADVAIR DISKUS) 250-50 MCG/ACT AEPB Inhale 1 puff into the lungs in the morning and at bedtime. 60 each 5   losartan (COZAAR) 50 MG tablet TAKE ONE TABLET BY MOUTH DAILY 90 tablet 1   metoprolol succinate (TOPROL-XL) 50 MG 24 hr tablet TAKE ONE TABLET BY MOUTH ONE TIME DAILY WITH A MEAL 90 tablet 1   progesterone (PROMETRIUM) 100 MG capsule TAKE ONE CAPSULE BY MOUTH DAILY 90 capsule 1   progesterone (PROMETRIUM) 100 MG capsule Take 1 capsule (100 mg total) by mouth daily. 90 capsule 1   saccharomyces boulardii (FLORASTOR) 250 MG capsule Take 250 mg by mouth daily.     No facility-administered medications prior to visit.    Allergies  Allergen Reactions   Demerol Nausea And Vomiting   Singulair [Montelukast]      Dry mouth/dehydration   Augmentin [Amoxicillin-Pot Clavulanate] Diarrhea    Caused Diarrhea    ROS     Objective:    Physical Exam  LMP 02/08/1997  Wt Readings from Last 3 Encounters:  08/19/21 119 lb (54 kg)  06/11/21 120 lb 9.6 oz (54.7 kg)  06/04/21 120 lb 12.8 oz (54.8 kg)    Diabetic Foot Exam - Simple   No data filed    Lab Results  Component Value Date   WBC 6.2 03/26/2021   HGB 13.6 03/26/2021   HCT 40.1 03/26/2021   PLT 260 03/26/2021   GLUCOSE 80 02/23/2021   CHOL 156 01/05/2021   TRIG 173.0 (H) 01/05/2021   HDL 54.40 01/05/2021   LDLDIRECT 50.0 11/14/2020   LDLCALC 67 01/05/2021   ALT 12 02/23/2021   AST 15 02/23/2021   NA 139 02/23/2021   K 4.0 02/23/2021   CL 105 02/23/2021   CREATININE 0.87 02/23/2021   BUN 14 02/23/2021   CO2 28 02/23/2021   TSH 2.15 11/14/2020    Lab Results  Component Value Date   TSH 2.15 11/14/2020   Lab Results  Component Value Date   WBC 6.2 03/26/2021   HGB 13.6 03/26/2021   HCT 40.1 03/26/2021   MCV 99.0 03/26/2021   PLT 260 03/26/2021   Lab Results  Component Value Date   NA 139 02/23/2021   K 4.0 02/23/2021   CO2 28 02/23/2021   GLUCOSE 80 02/23/2021   BUN 14 02/23/2021   CREATININE 0.87 02/23/2021   BILITOT 0.4 02/23/2021   ALKPHOS 32 (L) 02/23/2021   AST 15 02/23/2021   ALT 12 02/23/2021   PROT 6.2 02/23/2021   ALBUMIN 3.9 02/23/2021   CALCIUM 9.2 02/23/2021   GFR 68.43 02/23/2021   Lab Results  Component Value Date   CHOL 156 01/05/2021   Lab Results  Component Value Date   HDL 54.40 01/05/2021   Lab Results  Component Value Date   LDLCALC 67 01/05/2021   Lab Results  Component Value Date   TRIG 173.0 (H) 01/05/2021   Lab Results  Component Value Date   CHOLHDL 3 01/05/2021   No results found for: "HGBA1C"  Assessment & Plan:   COLONOSCOPY: 08/20/20 MAMMO: 03/23/21 PAP: 2018 PSA: n/a DEXA: 11/20/2020   Problem List Items Addressed This Visit   None   I am having  Casey Rowe maintain her ALPRAZolam, busPIRone, buPROPion, azelastine, saccharomyces boulardii, fenofibrate micronized, metoprolol succinate, losartan, albuterol, albuterol, fluticasone-salmeterol, progesterone, estradiol, estradiol, and progesterone.  No orders of the defined types were placed in this encounter.

## 2021-08-25 ENCOUNTER — Ambulatory Visit (INDEPENDENT_AMBULATORY_CARE_PROVIDER_SITE_OTHER): Payer: Medicare PPO | Admitting: Family Medicine

## 2021-08-25 ENCOUNTER — Other Ambulatory Visit (HOSPITAL_BASED_OUTPATIENT_CLINIC_OR_DEPARTMENT_OTHER): Payer: Self-pay

## 2021-08-25 ENCOUNTER — Encounter: Payer: Self-pay | Admitting: Family Medicine

## 2021-08-25 VITALS — BP 102/70 | HR 60 | Resp 20 | Ht 64.0 in | Wt 118.0 lb

## 2021-08-25 DIAGNOSIS — Z0000 Encounter for general adult medical examination without abnormal findings: Secondary | ICD-10-CM

## 2021-08-25 DIAGNOSIS — E785 Hyperlipidemia, unspecified: Secondary | ICD-10-CM

## 2021-08-25 DIAGNOSIS — R197 Diarrhea, unspecified: Secondary | ICD-10-CM | POA: Diagnosis not present

## 2021-08-25 DIAGNOSIS — J4540 Moderate persistent asthma, uncomplicated: Secondary | ICD-10-CM | POA: Diagnosis not present

## 2021-08-25 DIAGNOSIS — D649 Anemia, unspecified: Secondary | ICD-10-CM | POA: Diagnosis not present

## 2021-08-25 DIAGNOSIS — I10 Essential (primary) hypertension: Secondary | ICD-10-CM | POA: Diagnosis not present

## 2021-08-25 MED ORDER — PFIZER COVID-19 VAC BIVALENT 30 MCG/0.3ML IM SUSP
INTRAMUSCULAR | 0 refills | Status: DC
  Filled 2021-08-25: qty 0.3, 1d supply, fill #0

## 2021-08-25 NOTE — Assessment & Plan Note (Signed)
Patient encouraged to maintain heart healthy diet, regular exercise, adequate sleep. Consider daily probiotics. Take medications as prescribed. Labs ordered and reviewed.  COLONOSCOPY: 08/20/20 MAMMO: 03/23/21 PAP: 2018 DEXA: 11/20/2020

## 2021-08-25 NOTE — Patient Instructions (Addendum)
[place]erba Matte tea do not drink  RSV (Respiratory Syncitial Virus) immunization, consider in the fall  Preventive Care 65 Years and Older, Female Preventive care refers to lifestyle choices and visits with your health care provider that can promote health and wellness. Preventive care visits are also called wellness exams. What can I expect for my preventive care visit? Counseling Your health care provider may ask you questions about your: Medical history, including: Past medical problems. Family medical history. Pregnancy and menstrual history. History of falls. Current health, including: Memory and ability to understand (cognition). Emotional well-being. Home life and relationship well-being. Sexual activity and sexual health. Lifestyle, including: Alcohol, nicotine or tobacco, and drug use. Access to firearms. Diet, exercise, and sleep habits. Work and work Statistician. Sunscreen use. Safety issues such as seatbelt and bike helmet use. Physical exam Your health care provider will check your: Height and weight. These may be used to calculate your BMI (body mass index). BMI is a measurement that tells if you are at a healthy weight. Waist circumference. This measures the distance around your waistline. This measurement also tells if you are at a healthy weight and may help predict your risk of certain diseases, such as type 2 diabetes and high blood pressure. Heart rate and blood pressure. Body temperature. Skin for abnormal spots. What immunizations do I need?  Vaccines are usually given at various ages, according to a schedule. Your health care provider will recommend vaccines for you based on your age, medical history, and lifestyle or other factors, such as travel or where you work. What tests do I need? Screening Your health care provider may recommend screening tests for certain conditions. This may include: Lipid and cholesterol levels. Hepatitis C test. Hepatitis B  test. HIV (human immunodeficiency virus) test. STI (sexually transmitted infection) testing, if you are at risk. Lung cancer screening. Colorectal cancer screening. Diabetes screening. This is done by checking your blood sugar (glucose) after you have not eaten for a while (fasting). Mammogram. Talk with your health care provider about how often you should have regular mammograms. BRCA-related cancer screening. This may be done if you have a family history of breast, ovarian, tubal, or peritoneal cancers. Bone density scan. This is done to screen for osteoporosis. Talk with your health care provider about your test results, treatment options, and if necessary, the need for more tests. Follow these instructions at home: Eating and drinking  Eat a diet that includes fresh fruits and vegetables, whole grains, lean protein, and low-fat dairy products. Limit your intake of foods with high amounts of sugar, saturated fats, and salt. Take vitamin and mineral supplements as recommended by your health care provider. Do not drink alcohol if your health care provider tells you not to drink. If you drink alcohol: Limit how much you have to 0-1 drink a day. Know how much alcohol is in your drink. In the [place] one drink equals one 12 oz bottle of beer (355 mL), one 5 oz glass of wine (148 mL), or one 1 oz glass of hard liquor (44 mL). Lifestyle Brush your teeth every morning and night with fluoride toothpaste. Floss one time each day. Exercise for at least 30 minutes 5 or more days each week. Do not use any products that contain nicotine or tobacco. These products include cigarettes, chewing tobacco, and vaping devices, such as e-cigarettes. If you need help quitting, ask your health care provider. Do not use drugs. If you are sexually active, practice safe sex. Use a  condom or other form of protection in order to prevent STIs. Take aspirin only as told by your health care provider. Make sure that you  understand how much to take and what form to take. Work with your health care provider to find out whether it is safe and beneficial for you to take aspirin daily. Ask your health care provider if you need to take a cholesterol-lowering medicine (statin). Find healthy ways to manage stress, such as: Meditation, yoga, or listening to music. Journaling. Talking to a trusted person. Spending time with friends and family. Minimize exposure to UV radiation to reduce your risk of skin cancer. Safety Always wear your seat belt while driving or riding in a vehicle. Do not drive: If you have been drinking alcohol. Do not ride with someone who has been drinking. When you are tired or distracted. While texting. If you have been using any mind-altering substances or drugs. Wear a helmet and other protective equipment during sports activities. If you have firearms in your house, make sure you follow all gun safety procedures. What's next? Visit your health care provider once a year for an annual wellness visit. Ask your health care provider how often you should have your eyes and teeth checked. Stay up to date on all vaccines. This information is not intended to replace advice given to you by your health care provider. Make sure you discuss any questions you have with your health care provider. Document Revised: 07/23/2020 Document Reviewed: 07/23/2020 Elsevier Patient Education  [place].

## 2021-08-25 NOTE — Assessment & Plan Note (Signed)
Following with Dr Loanne Drilling and doing better on Advair 250/50 twice daily

## 2021-08-25 NOTE — Assessment & Plan Note (Addendum)
Has been struggling with this since April 2022, diminished appetite, now stable weight consider PET/CT scan but she will hold off for now. She uses a good deal of Splenda and is encouraged to stop that use and to add Benefiber once to twice daily. Report if worsens.

## 2021-08-25 NOTE — Assessment & Plan Note (Signed)
Well controlled, no changes to meds. Encouraged heart healthy diet such as the DASH diet and exercise as tolerated.  °

## 2021-08-25 NOTE — Assessment & Plan Note (Signed)
Encourage heart healthy diet such as MIND or DASH diet, increase exercise, avoid trans fats, simple carbohydrates and processed foods, consider a krill or fish or flaxseed oil cap daily.  °

## 2021-08-26 LAB — LIPID PANEL
Cholesterol: 166 mg/dL (ref 0–200)
HDL: 63.1 mg/dL (ref >39.00)
LDL Cholesterol: 76 mg/dL (ref 0–99)
NonHDL: 103.06
Total CHOL/HDL Ratio: 3
Triglycerides: 134 mg/dL (ref 0.0–149.0)
VLDL: 26.8 mg/dL (ref 0.0–40.0)

## 2021-08-26 LAB — COMPREHENSIVE METABOLIC PANEL
ALT: 9 U/L (ref 0–35)
AST: 16 U/L (ref 0–37)
Albumin: 4.3 g/dL (ref 3.5–5.2)
Alkaline Phosphatase: 27 U/L — ABNORMAL LOW (ref 39–117)
BUN: 14 mg/dL (ref 6–23)
CO2: 28 mEq/L (ref 19–32)
Calcium: 9.8 mg/dL (ref 8.4–10.5)
Chloride: 104 mEq/L (ref 96–112)
Creatinine, Ser: 0.78 mg/dL (ref 0.40–1.20)
GFR: 77.73 mL/min (ref >60.00)
Glucose, Bld: 89 mg/dL (ref 70–99)
Potassium: 4.3 mEq/L (ref 3.5–5.1)
Sodium: 140 mEq/L (ref 135–145)
Total Bilirubin: 0.5 mg/dL (ref 0.2–1.2)
Total Protein: 6.5 g/dL (ref 6.0–8.3)

## 2021-08-26 LAB — IRON,TIBC AND FERRITIN PANEL
%SAT: 35 % (calc) (ref 16–45)
Ferritin: 380 ng/mL — ABNORMAL HIGH (ref 16–288)
Iron: 118 ug/dL (ref 45–160)
TIBC: 340 mcg/dL (calc) (ref 250–450)

## 2021-08-26 LAB — CBC WITH DIFFERENTIAL/PLATELET
Basophils Absolute: 0.1 10*3/uL (ref 0.0–0.1)
Basophils Relative: 1.5 % (ref 0.0–3.0)
Eosinophils Absolute: 0.4 10*3/uL (ref 0.0–0.7)
Eosinophils Relative: 10.3 % — ABNORMAL HIGH (ref 0.0–5.0)
HCT: 38.7 % (ref 36.0–46.0)
Hemoglobin: 12.7 g/dL (ref 12.0–15.0)
Lymphocytes Relative: 24.3 % (ref 12.0–46.0)
Lymphs Abs: 1 10*3/uL (ref 0.7–4.0)
MCHC: 32.8 g/dL (ref 30.0–36.0)
MCV: 100.8 fl — ABNORMAL HIGH (ref 78.0–100.0)
Monocytes Absolute: 0.5 10*3/uL (ref 0.1–1.0)
Monocytes Relative: 11.6 % (ref 3.0–12.0)
Neutro Abs: 2.1 10*3/uL (ref 1.4–7.7)
Neutrophils Relative %: 52.3 % (ref 43.0–77.0)
Platelets: 202 10*3/uL (ref 150.0–400.0)
RBC: 3.84 Mil/uL — ABNORMAL LOW (ref 3.87–5.11)
RDW: 13 % (ref 11.5–15.5)
WBC: 4 10*3/uL (ref 4.0–10.5)

## 2021-08-26 LAB — TSH: TSH: 0.75 u[IU]/mL (ref 0.35–5.50)

## 2021-09-05 ENCOUNTER — Other Ambulatory Visit: Payer: Self-pay | Admitting: Family Medicine

## 2021-09-12 ENCOUNTER — Other Ambulatory Visit: Payer: Self-pay | Admitting: Family Medicine

## 2021-09-14 ENCOUNTER — Other Ambulatory Visit: Payer: Self-pay | Admitting: Family Medicine

## 2021-09-22 ENCOUNTER — Encounter: Payer: Self-pay | Admitting: Family Medicine

## 2021-09-22 NOTE — Telephone Encounter (Signed)
Lvm to schedule vv

## 2021-09-23 ENCOUNTER — Encounter: Payer: Self-pay | Admitting: Obstetrics & Gynecology

## 2021-09-23 NOTE — Telephone Encounter (Signed)
Called pt appointment was made

## 2021-09-25 ENCOUNTER — Telehealth (INDEPENDENT_AMBULATORY_CARE_PROVIDER_SITE_OTHER): Payer: Medicare PPO | Admitting: Family Medicine

## 2021-09-25 DIAGNOSIS — R197 Diarrhea, unspecified: Secondary | ICD-10-CM | POA: Diagnosis not present

## 2021-09-25 DIAGNOSIS — R6889 Other general symptoms and signs: Secondary | ICD-10-CM | POA: Diagnosis not present

## 2021-09-25 DIAGNOSIS — I10 Essential (primary) hypertension: Secondary | ICD-10-CM | POA: Diagnosis not present

## 2021-09-25 NOTE — Assessment & Plan Note (Signed)
Monitor and report any concerns, no changes to meds. Encouraged heart healthy diet such as the DASH diet and exercise as tolerated.  ?

## 2021-09-25 NOTE — Assessment & Plan Note (Signed)
It is much improved and she has roughly 2 stools daily

## 2021-09-25 NOTE — Progress Notes (Signed)
MyChart Video Visit    Virtual Visit via Video Note   This visit type was conducted due to national recommendations for restrictions regarding the COVID-19 Pandemic (e.g. social distancing) in an effort to limit this patient's exposure and mitigate transmission in our community. This patient is at least at moderate risk for complications without adequate follow up. This format is felt to be most appropriate for this patient at this time. Physical exam was limited by quality of the video and audio technology used for the visit. Shamaine, CMA was able to get the patient set up on a video visit.  Patient location: home Patient and provider in visit Provider location: Office  I discussed the limitations of evaluation and management by telemedicine and the availability of in person appointments. The patient expressed understanding and agreed to proceed.  Visit Date: 09/25/2021  Today's healthcare provider: Penni Homans, MD     Subjective:    Patient ID: Casey Rowe, female    DOB: October 24, 1950, 71 y.o.   MRN: [id number]  Chief Complaint  Patient presents with   Referral    Discuss Referral    HPI Patient is in today for evaluation of some spells that have begun to occur again. reports she has begun having episodes of diminished responsiveness that last 45 to 60 seconds again.  She notes she had similar episodes back in 2021 they occurred in a cluster and then resolved and had not really occurred in the last couple years.  Then a couple weeks ago she started having episodes again she knows they are about to happen she says she feels the need to sit down and put her head down and they last as noted 30 to 60 seconds when they are over she feels very weak.  She is not able to speak during the acute episode but very soon thereafter she is able to speak again.  While she feels weak she can in fact move all of her extremities and walk around.  She notes she feels extremely nauseous but does  not endorse vomiting during the recovery phase which typically lasts about half an hour.  She also feels excessive fatigue and weakness describes herself as feeling washed out.  Often feels a little clammy or diaphoretic.  She does not endorse palpitations, chest pain or shortness of breath.  She does not endorse any pattern of behavior or activity before these episodes occur mostly they happen while standing but she does feel she had one episode that awoken her from sleep. Denies CP/palp/SOB/HA/congestion/fevers or GU c/o. Taking meds as prescribed   Past Medical History:  Diagnosis Date   Allergy    Anxiety    Asthma    Broken jaw ([place]) 1980s   Depression    Depression with anxiety    Hypertension    Preventative health care 03/16/2015   Sarcoidosis    Tubal pregnancy 1981    Past Surgical History:  Procedure Laterality Date   ABDOMINAL HYSTERECTOMY     CESAREAN SECTION     ECTOPIC PREGNANCY SURGERY  1981   FRACTURE SURGERY  1980s   broken jaw    Family History  Problem Relation Age of Onset   Hypertension Mother    Dementia Mother    Diabetes Mother    Diabetes Father    Heart attack Father    Heart disease Father    Psoriasis Father    Cancer Sister        pancreatic  Alcohol abuse Sister        substance   Hepatitis B Brother    Gout Brother    Hypertension Brother    Glaucoma Brother    Heart disease Brother    Rashes / Skin problems Son    Other Son        bad allergies, aspirin, dermatitis   Rashes / Skin problems Son    Cancer Maternal Aunt        breast   Breast cancer Maternal Aunt     Social History   Socioeconomic History   Marital status: Married    Spouse name: Not on file   Number of children: Not on file   Years of education: Not on file   Highest education level: Not on file  Occupational History   Not on file  Tobacco Use   Smoking status: Never   Smokeless tobacco: Never  Vaping Use   Vaping Use: Never used  Substance and Sexual  Activity   Alcohol use: Yes   Drug use: No   Sexual activity: Yes    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[employer], lives with husband, 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, minimal red meat  Other Topics Concern   Not on file  Social History Narrative   Works as a Pharmacist, 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   Married   Grown children- 3         Social Determinants of Radio broadcast assistant Strain: Not on file  Food Insecurity: Not on file  Transportation Needs: Not on file  Physical Activity: Not on file  Stress: Not on file  Social Connections: Not on file  Intimate Partner Violence: Not on file    Outpatient Medications Prior to Visit  Medication Sig Dispense Refill   losartan (COZAAR) 50 MG tablet TAKE ONE TABLET BY MOUTH DAILY 90 tablet 1   albuterol (VENTOLIN HFA) 108 (90 Base) MCG/ACT inhaler Inhale 1-2 puffs into the lungs every 4 (four) hours as needed for wheezing or shortness of breath. 1 each 0   albuterol (VENTOLIN HFA) 108 (90 Base) MCG/ACT inhaler INHALE 2 PUFFS INTO THE LUNGS EVERY 6 HOURS AS NEEDED FOR WHEEZING 18 g 5   ALPRAZolam (XANAX) 0.25 MG tablet Take 0.25 mg by mouth daily.      azelastine (ASTELIN) 0.1 % nasal spray Place 2 sprays into both nostrils 2 (two) times daily. Use in each nostril as directed 30 mL 12   buPROPion (WELLBUTRIN XL) 300 MG 24 hr tablet Take 300 mg by mouth daily.     busPIRone (BUSPAR) 5 MG tablet Take 5 mg by mouth 3 (three) times daily.      COVID-19 mRNA bivalent vaccine, Pfizer, (PFIZER COVID-19 VAC BIVALENT) injection Inject into the muscle. 0.3 mL 0   estradiol (ESTRACE) 1 MG tablet Take 1 tablet (1 mg total) by mouth daily. 90 tablet 1   fenofibrate micronized (LOFIBRA) 134 MG capsule TAKE ONE CAPSULE BY MOUTH EVERY MORNING BEFORE BREAKFAST 90 capsule 0   fluticasone-salmeterol (ADVAIR DISKUS) 250-50 MCG/ACT AEPB Inhale 1 puff into the lungs in the morning and at bedtime. 60 each 5   metoprolol succinate (TOPROL-XL) 50 MG 24 hr tablet TAKE ONE TABLET BY MOUTH DAILY WITH  MEAL 90 tablet 1   progesterone (PROMETRIUM) 100 MG capsule TAKE ONE CAPSULE BY MOUTH DAILY 90 capsule 1   progesterone (PROMETRIUM) 100 MG capsule Take 1 capsule (100 mg total) by mouth daily. 90 capsule 1   saccharomyces boulardii (FLORASTOR) 250  MG capsule Take 250 mg by mouth daily.     No facility-administered medications prior to visit.    Allergies  Allergen Reactions   Demerol Nausea And Vomiting   Singulair [Montelukast]     Dry mouth/dehydration   Augmentin [Amoxicillin-Pot Clavulanate] Diarrhea    Caused Diarrhea    Review of Systems  Constitutional:  Positive for diaphoresis and malaise/fatigue. Negative for fever.  HENT:  Negative for congestion.   Eyes:  Negative for blurred vision.  Respiratory:  Negative for shortness of breath.   Cardiovascular:  Negative for chest pain, palpitations and leg swelling.  Gastrointestinal:  Positive for diarrhea and nausea. Negative for abdominal pain, blood in stool and vomiting.  Genitourinary:  Negative for dysuria and frequency.  Musculoskeletal:  Negative for falls.  Skin:  Negative for rash.  Neurological:  Positive for weakness. Negative for dizziness, loss of consciousness and headaches.  Endo/Heme/Allergies:  Negative for environmental allergies.  Psychiatric/Behavioral:  Negative for depression. The patient is not nervous/anxious.        Objective:    Physical Exam  LMP 02/08/1997  Wt Readings from Last 3 Encounters:  08/25/21 118 lb (53.5 kg)  08/19/21 119 lb (54 kg)  06/11/21 120 lb 9.6 oz (54.7 kg)    Diabetic Foot Exam - Simple   No data filed    Lab Results  Component Value Date   WBC 4.0 08/25/2021   HGB 12.7 08/25/2021   HCT 38.7 08/25/2021   PLT 202.0 08/25/2021   GLUCOSE 89 08/25/2021   CHOL 166 08/25/2021   TRIG 134.0 08/25/2021   HDL 63.10 08/25/2021   LDLDIRECT 50.0 11/14/2020   LDLCALC 76 08/25/2021   ALT 9 08/25/2021   AST 16 08/25/2021   NA 140 08/25/2021   K 4.3 08/25/2021   CL  104 08/25/2021   CREATININE 0.78 08/25/2021   BUN 14 08/25/2021   CO2 28 08/25/2021   TSH 0.75 08/25/2021    Lab Results  Component Value Date   TSH 0.75 08/25/2021   Lab Results  Component Value Date   WBC 4.0 08/25/2021   HGB 12.7 08/25/2021   HCT 38.7 08/25/2021   MCV 100.8 (H) 08/25/2021   PLT 202.0 08/25/2021   Lab Results  Component Value Date   NA 140 08/25/2021   K 4.3 08/25/2021   CO2 28 08/25/2021   GLUCOSE 89 08/25/2021   BUN 14 08/25/2021   CREATININE 0.78 08/25/2021   BILITOT 0.5 08/25/2021   ALKPHOS 27 (L) 08/25/2021   AST 16 08/25/2021   ALT 9 08/25/2021   PROT 6.5 08/25/2021   ALBUMIN 4.3 08/25/2021   CALCIUM 9.8 08/25/2021   GFR 77.73 08/25/2021   Lab Results  Component Value Date   CHOL 166 08/25/2021   Lab Results  Component Value Date   HDL 63.10 08/25/2021   Lab Results  Component Value Date   LDLCALC 76 08/25/2021   Lab Results  Component Value Date   TRIG 134.0 08/25/2021   Lab Results  Component Value Date   CHOLHDL 3 08/25/2021   No results found for: "HGBA1C"     Assessment & Plan:   Problem List Items Addressed This Visit     Diarrhea    It is much improved and she has roughly 2 stools daily      Hypertension    Monitor and report any concerns, no changes to meds. Encouraged heart healthy diet such as the DASH diet and exercise as tolerated.  Spells of decreased attentiveness    She reports she has begun having episodes of diminished responsiveness that last 45 to 60 seconds again.  She notes she had similar episodes back in 2021 they occurred in a cluster and then resolved and had not really occurred in the last couple years.  Then a couple weeks ago she started having episodes again she knows they are about to happen she says she feels the need to sit down and put her head down and they last as noted 30 to 60 seconds when they are over she feels very weak.  She is not able to speak during the acute episode but  very soon thereafter she is able to speak again.  While she feels weak she can in fact move all of her extremities and walk around.  She notes she feels extremely nauseous but does not endorse vomiting during the recovery phase which typically lasts about half an hour.  She also feels excessive fatigue and weakness describes herself as feeling washed out.  Often feels a little clammy or diaphoretic.  She does not endorse palpitations, chest pain or shortness of breath.  She does not endorse any pattern of behavior or activity before these episodes occur mostly they happen while standing but she does feel she had one episode that awoken her from sleep.  She wants to consider referral to neurology but has decided she will spend the next few weeks documenting when these episodes occur how she has eaten, hydrated and if the episodes persist she will let us know and we will refer. Spent 30 minutes discussing current state and plan of care       I am having Saralee L. Sheahan maintain her ALPRAZolam, busPIRone, buPROPion, azelastine, saccharomyces boulardii, albuterol, albuterol, fluticasone-salmeterol, progesterone, estradiol, progesterone, Pfizer COVID-19 Vac Bivalent, losartan, metoprolol succinate, and fenofibrate micronized.  No orders of the defined types were placed in this encounter.   I discussed the assessment and treatment plan with the patient. The patient was provided an opportunity to ask questions and all were answered. The patient agreed with the plan and demonstrated an understanding of the instructions.   The patient was advised to call back or seek an in-person evaluation if the symptoms worsen or if the condition fails to improve as anticipated   Penni Homans, MD [facility] at [facility] [phone] (phone) [phone] (fax)  [place]

## 2021-09-25 NOTE — Assessment & Plan Note (Signed)
She reports she has begun having episodes of diminished responsiveness that last 45 to 60 seconds again.  She notes she had similar episodes back in 2021 they occurred in a cluster and then resolved and had not really occurred in the last couple years.  Then a couple weeks ago she started having episodes again she knows they are about to happen she says she feels the need to sit down and put her head down and they last as noted 30 to 60 seconds when they are over she feels very weak.  She is not able to speak during the acute episode but very soon thereafter she is able to speak again.  While she feels weak she can in fact move all of her extremities and walk around.  She notes she feels extremely nauseous but does not endorse vomiting during the recovery phase which typically lasts about half an hour.  She also feels excessive fatigue and weakness describes herself as feeling washed out.  Often feels a little clammy or diaphoretic.  She does not endorse palpitations, chest pain or shortness of breath.  She does not endorse any pattern of behavior or activity before these episodes occur mostly they happen while standing but she does feel she had one episode that awoken her from sleep.  She wants to consider referral to neurology but has decided she will spend the next few weeks documenting when these episodes occur how she has eaten, hydrated and if the episodes persist she will let us know and we will refer. Spent 30 minutes discussing current state and plan of care

## 2021-11-11 DIAGNOSIS — F331 Major depressive disorder, recurrent, moderate: Secondary | ICD-10-CM | POA: Diagnosis not present

## 2021-11-11 DIAGNOSIS — F411 Generalized anxiety disorder: Secondary | ICD-10-CM | POA: Diagnosis not present

## 2021-11-23 ENCOUNTER — Ambulatory Visit: Payer: Medicare PPO | Admitting: Pulmonary Disease

## 2021-11-27 ENCOUNTER — Other Ambulatory Visit (HOSPITAL_BASED_OUTPATIENT_CLINIC_OR_DEPARTMENT_OTHER): Payer: Self-pay

## 2021-11-27 MED ORDER — COMIRNATY 30 MCG/0.3ML IM SUSY
PREFILLED_SYRINGE | INTRAMUSCULAR | 0 refills | Status: DC
Start: 2021-11-27 — End: 2022-01-04
  Filled 2021-11-27: qty 0.3, 1d supply, fill #0

## 2021-12-22 ENCOUNTER — Other Ambulatory Visit: Payer: Self-pay | Admitting: Family Medicine

## 2021-12-22 DIAGNOSIS — F411 Generalized anxiety disorder: Secondary | ICD-10-CM | POA: Diagnosis not present

## 2021-12-22 DIAGNOSIS — F331 Major depressive disorder, recurrent, moderate: Secondary | ICD-10-CM | POA: Diagnosis not present

## 2022-01-04 ENCOUNTER — Ambulatory Visit (INDEPENDENT_AMBULATORY_CARE_PROVIDER_SITE_OTHER): Payer: Medicare PPO | Admitting: Pulmonary Disease

## 2022-01-04 ENCOUNTER — Encounter (HOSPITAL_BASED_OUTPATIENT_CLINIC_OR_DEPARTMENT_OTHER): Payer: Self-pay | Admitting: Pulmonary Disease

## 2022-01-04 VITALS — BP 136/70 | HR 63 | Ht 64.0 in | Wt 117.4 lb

## 2022-01-04 DIAGNOSIS — J4540 Moderate persistent asthma, uncomplicated: Secondary | ICD-10-CM | POA: Diagnosis not present

## 2022-01-04 DIAGNOSIS — R053 Chronic cough: Secondary | ICD-10-CM

## 2022-01-04 MED ORDER — FLUTICASONE-SALMETEROL 250-50 MCG/ACT IN AEPB: 1.0000 | INHALATION_SPRAY | Freq: Two times a day (BID) | RESPIRATORY_TRACT | 5 refills | Status: DC

## 2022-01-04 MED ORDER — ALBUTEROL SULFATE HFA 108 (90 BASE) MCG/ACT IN AERS: 1.0000 | INHALATION_SPRAY | RESPIRATORY_TRACT | 5 refills | Status: DC | PRN

## 2022-01-04 NOTE — Progress Notes (Signed)
Subjective:   PATIENT ID: Casey Rowe GENDER: female DOB: 15-Jan-1951, MRN: [id number]   HPI  Chief Complaint  Patient presents with   Follow-up    Sarcoid f/u    Reason for Visit: Follow-up  [name] is a 71 year old female never smoker with sarcoidosis, asthma, HTN, anxiety, morton's neuroma who for follow-up.  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 On PRN low-dose Advair for her asthma. She reports chronic cough that begin in October 2022. Has worsened in the last 2.5 months. She was diagnosed with COVID, [place] variant, in Jan 2023. She had had persistent productive cough. Sputum production has improved Tried tessalon perles. She was seen in [place] ED for wheezing, cough, and shortness of breath for asthma exacerbation in Feb 2023.  Heat, laying on her back will worsen her cough. Robitussin improves her cough. Denies chest tightness. Works out 3-4 times a week on cardio machine. In the last year she reports 20 lb of unintentional weight loss. Thyroid levels normal. Previously evaluated for diarrhea.  06/11/21 Since our last visit she was treated for asthma exacerbation with prednisone and prescribed Advair. With the steroid, she reports productive coughing and wheezing resolved. Shortness of breath has improved. Has to use robitussin frequently. Weight has been stabilizing. She reports occasional episodes of diaphoresis a few weeks ago. State her diet could be better  08/19/21 Cough improves with daily Advair use. Does have some hoarseness. She rinses mouth out. Continues to have diarrhea. Weight is stable between 116-119 lbs. She eats two meals a day and her husband reminds her to eat. She has no appetite.  01/04/22 Recently fell on her knee and having difficulty ambulating. Her cough is fairly controlled on Advair 1-2 times daily. Has to use albuterol 2-3 times a day to stop coughing fits. The  advair does cause her to be hoarse but she feels it is effective. Denies exacerbations requiring steroids or antibiotics since last visit. In general she is very active, going to the gym 4-5 days a week so she is frustrated with her injury. She will see her PCP for evaluation for this. Denies shortness of breath or wheezing.  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  Past Medical History:  Diagnosis Date   Allergy    Anxiety    Asthma    Broken jaw ([place]) 1980s   Depression    Depression with anxiety    Hypertension    Preventative health care 03/16/2015   Sarcoidosis    Tubal pregnancy 1981     Family History  Problem Relation Age of Onset   Hypertension Mother    Dementia Mother    Diabetes Mother    Diabetes Father    Heart attack Father    Heart disease Father    Psoriasis Father    Cancer Sister        pancreatic   Alcohol abuse Sister        substance   Hepatitis B Brother    Gout Brother    Hypertension Brother    Glaucoma Brother    Heart disease Brother    Rashes / Skin problems Son    Other Son        bad allergies, aspirin, dermatitis   Rashes / Skin problems Son    Cancer Maternal Aunt        breast   Breast cancer Maternal Aunt  Social History   Occupational History   Not on file  Tobacco Use   Smoking status: Never   Smokeless tobacco: Never  Vaping Use   Vaping Use: Never used  Substance and Sexual Activity   Alcohol use: Yes   Drug use: No   Sexual activity: Yes    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      azelastine (ASTELIN) 0.1 % nasal spray Place 2 sprays into both nostrils 2 (two) times daily. Use in each nostril as directed 30 mL 12   buPROPion (WELLBUTRIN XL) 300 MG 24 hr tablet Take 300 mg by mouth daily.     busPIRone (BUSPAR) 5 MG tablet Take 5 mg by mouth 3 (three) times daily.      estradiol (ESTRACE) 1 MG tablet Take 1 tablet (1 mg total) by mouth daily. 90 tablet 1   fenofibrate micronized (LOFIBRA) 134 MG capsule TAKE ONE CAPSULE BY MOUTH EVERY MORNING BEFORE BREAKFAST 90 capsule 0   losartan (COZAAR) 50 MG tablet TAKE ONE TABLET BY MOUTH DAILY 90 tablet 1   metoprolol succinate (TOPROL-XL) 50 MG 24 hr tablet TAKE ONE TABLET BY MOUTH DAILY WITH MEAL 90 tablet 1   progesterone (PROMETRIUM) 100 MG capsule TAKE ONE CAPSULE BY MOUTH DAILY 90 capsule 1   progesterone (PROMETRIUM) 100 MG capsule Take 1 capsule (100 mg total) by mouth daily. 90 capsule 1   saccharomyces boulardii (FLORASTOR) 250 MG capsule Take 250 mg by mouth daily.     albuterol (VENTOLIN HFA) 108 (90 Base) MCG/ACT inhaler Inhale 1-2 puffs into the lungs every 4 (four) hours as needed for wheezing or shortness of breath. 1 each 0   fluticasone-salmeterol (ADVAIR DISKUS) 250-50 MCG/ACT AEPB Inhale 1 puff into the lungs in the morning and at bedtime. 60 each 5   COVID-19 mRNA bivalent vaccine, Pfizer, (PFIZER COVID-19 VAC BIVALENT) injection Inject into the muscle. 0.3 mL 0   COVID-19 mRNA vaccine 2023-2024 (COMIRNATY) syringe Inject into the muscle. 0.3 mL 0   No facility-administered medications prior to visit.    Review of Systems  Constitutional:  Negative for chills, diaphoresis, fever, malaise/fatigue and weight loss.  HENT:  Negative for congestion.   Respiratory:  Positive for cough. Negative for hemoptysis, sputum production, shortness of breath and wheezing.   Cardiovascular:  Negative for chest pain,  palpitations and leg swelling.     Objective:   Vitals:   01/04/22 1340  BP: 136/70  Pulse: 63  SpO2: 99%  Weight: 117 lb 6.4 oz (53.3 kg)  Height: '5\' 4"'$  (1.626 m)   SpO2: 99 % O2 Device: None (Room air)  Physical Exam: General: Thin, chronically ill-appearing, no acute distress HENT: [place], AT Eyes: EOMI, no scleral icterus Respiratory: Clear to auscultation bilaterally.  No crackles, wheezing or rales Cardiovascular: RRR, -M/R/G, no JVD Extremities:-Edema,-tenderness Neuro: AAO x4, CNII-XII grossly intact Psych: Normal mood, normal affect   Data Reviewed:  Imaging: CXR  03/26/21 - No active issues. No hilar adenopathy  PFT: 08/19/21 FVC 3.31 (107%) FEV1 2.48 (106%) Ratio 79  TLC 117% RV 139% DLCO 91%.  Interpretation: On Advair. No obstructive or restrictive defect present. No significant BD response however does not preclude bronchodilator benefit. F-V loops suggestive of minimal small airway disease.   Labs: CBC    Component Value Date/Time   WBC 4.0 08/25/2021 1441   RBC 3.84 (L) 08/25/2021 1441   HGB 12.7 08/25/2021 1441   HCT 38.7 08/25/2021 1441   PLT 202.0 08/25/2021 1441   MCV 100.8 (H) 08/25/2021 1441   MCH 33.6 03/26/2021 2202   MCHC 32.8 08/25/2021 1441   RDW 13.0 08/25/2021 1441   LYMPHSABS 1.0 08/25/2021 1441   MONOABS 0.5 08/25/2021 1441   EOSABS 0.4 08/25/2021 1441   BASOSABS 0.1 08/25/2021 1441   Absolute eos 03/26/21 -700    Assessment & Plan:   Discussion: 71 year old female never smoker with sarcoidosis, asthma, HTN, anxiety, morton's neuroma who presents for follow-up. Sarcoid has been quiescent since the 1990s. Asthma symptoms fairly controlled on current inhaler regimen however not taking consistently as prescribed. If symptoms progressive, consider optimizing inhaler compliance and if still present, consider evaluation for active sarcoid. Also discussed gabapentin however patient wants to limit meds.  Moderate persistent asthma -  symptomatic but controlled on current regimen CONTINUE Advair 250-50 mcg ONE puff TWICE a day. 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  --Annual PFTs.  Next due 08/2022 --Annual ophthalmology exam. Patient to schedule  --EKG today: NSR  Health Maintenance  Immunization History  Administered Date(s) Administered   COVID-19, mRNA, vaccine(Comirnaty)12 years and older 11/27/2021   DTaP 08/09/2010   Fluad Quad(high Dose 65+) 01/17/2020, 11/14/2020   Influenza Split 02/22/2011   Influenza, High Dose Seasonal PF 04/22/2018, 11/04/2018   Influenza,inj,Quad PF,6+ Mos 01/30/2014, 03/11/2015, 03/16/2016   PFIZER Comirnaty(Gray Top)Covid-19 Tri-Sucrose Vaccine 05/20/2020   PFIZER(Purple Top)SARS-COV-2 Vaccination 03/15/2019, 04/05/2019, 12/05/2019, 10/21/2020   Pfizer Covid-19 Vaccine Bivalent Booster 78yr & up 08/19/2021   Pneumococcal Polysaccharide-23 11/04/2018   Tdap 11/04/2018, 08/14/2020, 08/14/2020   Zoster Recombinat (Shingrix) 08/14/2020, 11/14/2020   Zoster, Live 03/11/2015   No orders of the defined types were placed in this encounter.  Meds ordered this encounter  Medications   fluticasone-salmeterol (ADVAIR DISKUS) 250-50 MCG/ACT AEPB    Sig: Inhale 1 puff into the lungs in the morning and at bedtime.    Dispense:  60 each    Refill:  5   albuterol (VENTOLIN HFA) 108 (90 Base) MCG/ACT inhaler    Sig: Inhale 1-2 puffs into the lungs every 4 (four) hours as needed for wheezing or shortness of breath.    Dispense:  1 each    Refill:  5   Return in about 6 months (around 07/05/2022).  I have spent a total time of 34-minutes on the day of the appointment including chart review, data review, collecting history, coordinating care and discussing medical diagnosis and plan with the patient/family. Past  medical history, allergies, medications were reviewed. Pertinent imaging, labs and tests included in this note have been reviewed and interpreted independently by me.  CMarriott[place]Slat[place] MD [facility] 01/04/2022 2:09 PM  Office Number [phone]

## 2022-01-04 NOTE — Patient Instructions (Signed)
Moderate persistent asthma - symptomatic but controlled on current regimen CONTINUE Advair 250-50 mcg ONE puff TWICE a day. Rinse and gargle mouth CONTINUE Albuterol AS NEEDED for shortness of breath or wheezing Unable to tolerate Singulair  Follow-up in 6 months

## 2022-01-05 ENCOUNTER — Encounter: Payer: Self-pay | Admitting: Family Medicine

## 2022-01-13 ENCOUNTER — Other Ambulatory Visit (HOSPITAL_BASED_OUTPATIENT_CLINIC_OR_DEPARTMENT_OTHER): Payer: Self-pay

## 2022-01-13 MED ORDER — FLUAD QUADRIVALENT 0.5 ML IM PRSY
PREFILLED_SYRINGE | INTRAMUSCULAR | 0 refills | Status: DC
  Filled 2022-01-13: qty 0.5, 1d supply, fill #0

## 2022-01-26 ENCOUNTER — Other Ambulatory Visit: Payer: Self-pay | Admitting: Obstetrics & Gynecology

## 2022-01-26 DIAGNOSIS — N951 Menopausal and female climacteric states: Secondary | ICD-10-CM

## 2022-02-10 ENCOUNTER — Encounter: Payer: Self-pay | Admitting: Family Medicine

## 2022-02-15 DIAGNOSIS — D539 Nutritional anemia, unspecified: Secondary | ICD-10-CM | POA: Insufficient documentation

## 2022-02-15 HISTORY — DX: Nutritional anemia, unspecified: D53.9

## 2022-02-15 NOTE — Assessment & Plan Note (Signed)
Monitor and report any concerns, no changes to meds. Encouraged heart healthy diet such as the DASH diet and exercise as tolerated.  ?

## 2022-02-15 NOTE — Assessment & Plan Note (Signed)
Encourage heart healthy diet such as MIND or DASH diet, increase exercise, avoid trans fats, simple carbohydrates and processed foods, consider a krill or fish or flaxseed oil cap daily.  °

## 2022-02-15 NOTE — Assessment & Plan Note (Signed)
Check a vitamin B12 level and monitor

## 2022-02-15 NOTE — Assessment & Plan Note (Signed)
Following with pulmonology 

## 2022-02-16 ENCOUNTER — Other Ambulatory Visit (HOSPITAL_BASED_OUTPATIENT_CLINIC_OR_DEPARTMENT_OTHER): Payer: Self-pay

## 2022-02-16 ENCOUNTER — Encounter: Payer: Self-pay | Admitting: Family Medicine

## 2022-02-16 ENCOUNTER — Ambulatory Visit (INDEPENDENT_AMBULATORY_CARE_PROVIDER_SITE_OTHER): Payer: Medicare PPO | Admitting: Family Medicine

## 2022-02-16 VITALS — BP 110/68 | HR 64 | Temp 98.0°F | Resp 16 | Ht 64.0 in | Wt 119.2 lb

## 2022-02-16 DIAGNOSIS — H547 Unspecified visual loss: Secondary | ICD-10-CM | POA: Diagnosis not present

## 2022-02-16 DIAGNOSIS — R634 Abnormal weight loss: Secondary | ICD-10-CM | POA: Diagnosis not present

## 2022-02-16 DIAGNOSIS — J4540 Moderate persistent asthma, uncomplicated: Secondary | ICD-10-CM

## 2022-02-16 DIAGNOSIS — G5763 Lesion of plantar nerve, bilateral lower limbs: Secondary | ICD-10-CM

## 2022-02-16 DIAGNOSIS — D539 Nutritional anemia, unspecified: Secondary | ICD-10-CM

## 2022-02-16 DIAGNOSIS — H109 Unspecified conjunctivitis: Secondary | ICD-10-CM

## 2022-02-16 DIAGNOSIS — E785 Hyperlipidemia, unspecified: Secondary | ICD-10-CM | POA: Diagnosis not present

## 2022-02-16 DIAGNOSIS — I10 Essential (primary) hypertension: Secondary | ICD-10-CM | POA: Diagnosis not present

## 2022-02-16 DIAGNOSIS — R197 Diarrhea, unspecified: Secondary | ICD-10-CM

## 2022-02-16 LAB — TSH: TSH: 1.51 u[IU]/mL (ref 0.35–5.50)

## 2022-02-16 LAB — CBC WITH DIFFERENTIAL/PLATELET
Basophils Absolute: 0.1 10*3/uL (ref 0.0–0.1)
Basophils Relative: 1.6 % (ref 0.0–3.0)
Eosinophils Absolute: 0.4 10*3/uL (ref 0.0–0.7)
Eosinophils Relative: 9.2 % — ABNORMAL HIGH (ref 0.0–5.0)
HCT: 39.6 % (ref 36.0–46.0)
Hemoglobin: 13.3 g/dL (ref 12.0–15.0)
Lymphocytes Relative: 25.8 % (ref 12.0–46.0)
Lymphs Abs: 1.2 10*3/uL (ref 0.7–4.0)
MCHC: 33.5 g/dL (ref 30.0–36.0)
MCV: 101.3 fl — ABNORMAL HIGH (ref 78.0–100.0)
Monocytes Absolute: 0.5 10*3/uL (ref 0.1–1.0)
Monocytes Relative: 10.9 % (ref 3.0–12.0)
Neutro Abs: 2.4 10*3/uL (ref 1.4–7.7)
Neutrophils Relative %: 52.5 % (ref 43.0–77.0)
Platelets: 237 10*3/uL (ref 150.0–400.0)
RBC: 3.91 Mil/uL (ref 3.87–5.11)
RDW: 13.5 % (ref 11.5–15.5)
WBC: 4.5 10*3/uL (ref 4.0–10.5)

## 2022-02-16 LAB — COMPREHENSIVE METABOLIC PANEL
ALT: 10 U/L (ref 0–35)
AST: 15 U/L (ref 0–37)
Albumin: 4 g/dL (ref 3.5–5.2)
Alkaline Phosphatase: 31 U/L — ABNORMAL LOW (ref 39–117)
BUN: 13 mg/dL (ref 6–23)
CO2: 28 mEq/L (ref 19–32)
Calcium: 9.4 mg/dL (ref 8.4–10.5)
Chloride: 105 mEq/L (ref 96–112)
Creatinine, Ser: 0.87 mg/dL (ref 0.40–1.20)
GFR: 67.96 mL/min (ref >60.00)
Glucose, Bld: 66 mg/dL — ABNORMAL LOW (ref 70–99)
Potassium: 4.7 mEq/L (ref 3.5–5.1)
Sodium: 140 mEq/L (ref 135–145)
Total Bilirubin: 0.4 mg/dL (ref 0.2–1.2)
Total Protein: 6.4 g/dL (ref 6.0–8.3)

## 2022-02-16 LAB — LIPID PANEL
Cholesterol: 152 mg/dL (ref 0–200)
HDL: 61.7 mg/dL (ref >39.00)
LDL Cholesterol: 67 mg/dL (ref 0–99)
NonHDL: 90.09
Total CHOL/HDL Ratio: 2
Triglycerides: 115 mg/dL (ref 0.0–149.0)
VLDL: 23 mg/dL (ref 0.0–40.0)

## 2022-02-16 LAB — SEDIMENTATION RATE: Sed Rate: 2 mm/hr (ref 0–30)

## 2022-02-16 MED ORDER — AZITHROMYCIN 250 MG PO TABS: ORAL_TABLET | ORAL | 0 refills | Status: AC

## 2022-02-16 MED ORDER — METHYLPREDNISOLONE 4 MG PO TABS: ORAL_TABLET | ORAL | 0 refills | Status: DC

## 2022-02-16 MED ORDER — NEOMYCIN-POLYMYXIN-HC 3.5-10000-1 OP SUSP: 3.0000 [drp] | Freq: Three times a day (TID) | OPHTHALMIC | 0 refills | Status: DC

## 2022-02-16 NOTE — Assessment & Plan Note (Signed)
Has stabilized and her appetite has improved

## 2022-02-16 NOTE — Assessment & Plan Note (Signed)
Down to 2-3 episodes a day. No bloody stool will continue to follow with GI

## 2022-02-16 NOTE — Patient Instructions (Signed)

## 2022-02-16 NOTE — Assessment & Plan Note (Signed)
And now several weeks of conjunctivitis

## 2022-02-16 NOTE — Assessment & Plan Note (Signed)
Sees Dr Mallie Mussel of Podiatry, she shuffles to manage the pain. Try topical Lidocaine gel

## 2022-02-16 NOTE — Progress Notes (Signed)
Subjective:   By signing my name below, I, Kellie Simmering, attest that this documentation has been prepared under the direction and in the presence of Mosie Lukes, MD., 02/16/2022.   Patient ID: Casey Rowe, female    DOB: 09-07-1950, 72 y.o.   MRN: [id number]  Chief Complaint  Patient presents with   Gastroesophageal Reflux    Discuss referral ophthalmologist    Follow-up    Follow up   HPI Patient is in today for an office visit. She denies CP/palpitations/HA/fevers/ GU symptoms.  Conjunctivitis Patient complains of conjunctivitis that has been present for the past 2 weeks. She is awakening with her eye lids sealed shut but denies itchiness or pain. She has been attempting to see an ophthalmologist and is interested in receiving a referral to ophthalmology.  Cough/Congestion Patient has been dealing with a chronic cough that has been persistent for multiple months, but worsened 4-6 weeks ago. She currently uses an Albuterol inhaler which temporarily resolves the cough.  Diarrhea Patient is having 2-3 episodes of diarrhea daily which are mostly loose. This has been a recurrent issue. She states that her weight has been fluctuating and she is losing 2 lbs on days when her diarrhea is intense. She denies abdominal pain, blood in stool, or constipation.  Foot Pain Patient reports that she has been falling frequently due to bilateral pain in her feet. She has an abnormal gait and struggles to lift her feet when walking. She is currently taking Gabapentin twice daily, but this has not been providing relief. She is interested in visiting podiatrist Dr. Mallie Mussel to manage this and is also considering seeing a neurologist.  Past Medical History:  Diagnosis Date   Allergy    Anxiety    Asthma    Broken jaw ([place]) 1980s   Depression    Depression with anxiety    Hypertension    Preventative health care 03/16/2015   Sarcoidosis    Tubal pregnancy 1981   Past Surgical History:   Procedure Laterality Date   ABDOMINAL HYSTERECTOMY     [place]   broken jaw    Family History  Problem Relation Age of Onset   Hypertension Mother    Dementia Mother    Diabetes Mother    Diabetes Father    Heart attack Father    Heart disease Father    Psoriasis Father    Cancer Sister        pancreatic   Alcohol abuse Sister        substance   Hepatitis B Brother    Gout Brother    Hypertension Brother    Glaucoma Brother    Heart disease Brother    Rashes / Skin problems Son    Other Son        bad allergies, aspirin, dermatitis   Rashes / Skin problems Son    Cancer Maternal Aunt        breast   Breast cancer Maternal Aunt     Social History   Socioeconomic History   Marital status: Married    Spouse name: Not on file   Number of children: Not on file   Years of education: Not on file   Highest education level: Not on file  Occupational History   Not on file  Tobacco Use   Smoking status: Never   Smokeless tobacco: Never  Vaping Use  Vaping Use: Never used  Substance and Sexual Activity   Alcohol use: Yes   Drug use: No   Sexual activity: Yes    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[employer], lives with husband, no dietary, minimal red meat  Other Topics Concern   Not on file  Social History Narrative   Works as a Pharmacist, 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   Married   Grown children- 3         Social Determinants of Radio broadcast assistant Strain: Not on file  Food Insecurity: Not on file  Transportation Needs: Not on file  Physical Activity: Not on file  Stress: Not on file  Social Connections: Not on file  Intimate Partner Violence: Not on file    Outpatient Medications Prior to Visit  Medication Sig Dispense Refill   albuterol (VENTOLIN HFA) 108 (90 Base) MCG/ACT inhaler INHALE 2 PUFFS INTO THE LUNGS EVERY 6 HOURS AS NEEDED FOR WHEEZING 18 g 5   albuterol (VENTOLIN HFA) 108 (90 Base)  MCG/ACT inhaler Inhale 1-2 puffs into the lungs every 4 (four) hours as needed for wheezing or shortness of breath. 1 each 5   ALPRAZolam (XANAX) 0.25 MG tablet Take 0.25 mg by mouth daily.      azelastine (ASTELIN) 0.1 % nasal spray Place 2 sprays into both nostrils 2 (two) times daily. Use in each nostril as directed 30 mL 12   buPROPion (WELLBUTRIN XL) 300 MG 24 hr tablet Take 300 mg by mouth daily.     busPIRone (BUSPAR) 5 MG tablet Take 5 mg by mouth 3 (three) times daily.      estradiol (ESTRACE) 1 MG tablet TAKE 1 TABLET BY MOUTH DAILY 90 tablet 1   fenofibrate micronized (LOFIBRA) 134 MG capsule TAKE ONE CAPSULE BY MOUTH EVERY MORNING BEFORE BREAKFAST 90 capsule 0   fluticasone-salmeterol (ADVAIR DISKUS) 250-50 MCG/ACT AEPB Inhale 1 puff into the lungs in the morning and at bedtime. 60 each 5   influenza vaccine adjuvanted (FLUAD QUADRIVALENT) 0.5 ML injection Inject into the muscle. 0.5 mL 0   losartan (COZAAR) 50 MG tablet TAKE ONE TABLET BY MOUTH DAILY 90 tablet 1   metoprolol succinate (TOPROL-XL) 50 MG 24 hr tablet TAKE ONE TABLET BY MOUTH DAILY WITH MEAL 90 tablet 1   progesterone (PROMETRIUM) 100 MG capsule TAKE ONE CAPSULE BY MOUTH DAILY 90 capsule 1   progesterone (PROMETRIUM) 100 MG capsule TAKE 1 CAPSULE BY MOUTH DAILY 90 capsule 1   saccharomyces boulardii (FLORASTOR) 250 MG capsule Take 250 mg by mouth daily.     No facility-administered medications prior to visit.    Allergies  Allergen Reactions   Demerol Nausea And Vomiting   Singulair [Montelukast]     Dry mouth/dehydration   Augmentin [Amoxicillin-Pot Clavulanate] Diarrhea    Caused Diarrhea    Review of Systems  Constitutional:  Negative for chills and fever.  HENT:  Positive for congestion.   Eyes:  Positive for discharge. Negative for pain.       (+) Conjunctivitis.  Respiratory:  Positive for cough.   Cardiovascular:  Negative for chest pain and palpitations.  Gastrointestinal:  Positive for diarrhea.  Negative for abdominal pain, blood in stool and constipation.  Genitourinary:  Negative for dysuria, frequency, hematuria and urgency.  Skin:           Neurological:  Negative for headaches.      Objective:    Physical Exam Constitutional:      General: She is not  in acute distress.    Appearance: Normal appearance. She is normal weight. She is not ill-appearing.  HENT:     Head: Normocephalic and atraumatic.     Right Ear: Tympanic membrane, ear canal and external ear normal.     Left Ear: Tympanic membrane, ear canal and external ear normal.     Nose: Nose normal.     Mouth/Throat:     Mouth: Mucous membranes are moist.     Pharynx: Oropharynx is clear.  Eyes:     General:        Right eye: No discharge.        Left eye: No discharge.     Extraocular Movements: Extraocular movements intact.     Conjunctiva/sclera: Conjunctivae normal.     Pupils: Pupils are equal, round, and reactive to light.  Neck:     Vascular: No carotid bruit.  Cardiovascular:     Rate and Rhythm: Normal rate and regular rhythm.     Pulses: Normal pulses.     Heart sounds: Normal heart sounds. No murmur heard.    No gallop.  Pulmonary:     Effort: Pulmonary effort is normal. No respiratory distress.     Breath sounds: Normal breath sounds. No wheezing or rales.  Abdominal:     General: Bowel sounds are normal.     Palpations: Abdomen is soft.     Tenderness: There is no abdominal tenderness. There is no guarding.  Musculoskeletal:        General: Normal range of motion.     Cervical back: Normal range of motion.     Right lower leg: No edema.     Left lower leg: No edema.  Lymphadenopathy:     Cervical: No cervical adenopathy.  Skin:    General: Skin is warm and dry.  Neurological:     Mental Status: She is alert and oriented to person, place, and time.  Psychiatric:        Mood and Affect: Mood normal.        Behavior: Behavior normal.        Judgment: Judgment normal.     BP 110/68  (BP Location: Right Arm, Patient Position: Sitting, Cuff Size: Normal)   Pulse 64   Temp 98 F (36.7 C) (Oral)   Resp 16   Ht '5\' 4"'$  (1.626 m)   Wt 119 lb 3.2 oz (54.1 kg)   LMP 02/08/1997   SpO2 98%   BMI 20.46 kg/m  Wt Readings from Last 3 Encounters:  02/16/22 119 lb 3.2 oz (54.1 kg)  01/04/22 117 lb 6.4 oz (53.3 kg)  08/25/21 118 lb (53.5 kg)    Diabetic Foot Exam - Simple   No data filed    Lab Results  Component Value Date   WBC 4.5 02/16/2022   HGB 13.3 02/16/2022   HCT 39.6 02/16/2022   PLT 237.0 02/16/2022   GLUCOSE 66 (L) 02/16/2022   CHOL 152 02/16/2022   TRIG 115.0 02/16/2022   HDL 61.70 02/16/2022   LDLDIRECT 50.0 11/14/2020   LDLCALC 67 02/16/2022   ALT 10 02/16/2022   AST 15 02/16/2022   NA 140 02/16/2022   K 4.7 02/16/2022   CL 105 02/16/2022   CREATININE 0.87 02/16/2022   BUN 13 02/16/2022   CO2 28 02/16/2022   TSH 1.51 02/16/2022    Lab Results  Component Value Date   TSH 1.51 02/16/2022   Lab Results  Component Value Date   WBC 4.5  02/16/2022   HGB 13.3 02/16/2022   HCT 39.6 02/16/2022   MCV 101.3 (H) 02/16/2022   PLT 237.0 02/16/2022   Lab Results  Component Value Date   NA 140 02/16/2022   K 4.7 02/16/2022   CO2 28 02/16/2022   GLUCOSE 66 (L) 02/16/2022   BUN 13 02/16/2022   CREATININE 0.87 02/16/2022   BILITOT 0.4 02/16/2022   ALKPHOS 31 (L) 02/16/2022   AST 15 02/16/2022   ALT 10 02/16/2022   PROT 6.4 02/16/2022   ALBUMIN 4.0 02/16/2022   CALCIUM 9.4 02/16/2022   GFR 67.96 02/16/2022   Lab Results  Component Value Date   CHOL 152 02/16/2022   Lab Results  Component Value Date   HDL 61.70 02/16/2022   Lab Results  Component Value Date   LDLCALC 67 02/16/2022   Lab Results  Component Value Date   TRIG 115.0 02/16/2022   Lab Results  Component Value Date   CHOLHDL 2 02/16/2022   No results found for: "HGBA1C"     Assessment & Plan:  Conjunctivitis: Eye drops have been prescribed and a referral to  ophthalmology has been made today.  Cough/Congestion: Encouraged patient to take probiotics. Azithromycin and Methylprednisolone have been prescribed today.  Foot Pain: Recommended Lidocaine gel to manage patient's bilateral foot pain.  Healthy Lifestyle: Encouraged adequate sleep, exercise, heart healthy diet, and hydration.  Labs: Routine blood work will be completed today.  Problem List Items Addressed This Visit     Diarrhea    Down to 2-3 episodes a day. No bloody stool will continue to follow with GI      Hypertension    Monitor and report any concerns, no changes to meds. Encouraged heart healthy diet such as the DASH diet and exercise as tolerated.       Relevant Orders   CBC w/Diff (Completed)   Sedimentation rate (Completed)   Comp Met (CMET) (Completed)   TSH (Completed)   Weight loss    Has stabilized and her appetite has improved      Morton's neuroma of both feet    Sees Dr Mallie Mussel of Podiatry, she shuffles to manage the pain. Try topical Lidocaine gel      Hyperlipidemia - Primary    Encourage heart healthy diet such as MIND or DASH diet, increase exercise, avoid trans fats, simple carbohydrates and processed foods, consider a krill or fish or flaxseed oil cap daily.       Relevant Orders   Lipid panel (Completed)   Moderate persistent asthma without complication    Following with pulmonology is struggling with an exacerbation with worsening cough for roughly past 4-5 weeks. Albuterol can be helpful. Given a zpak and medrol dosepak and if no response will follow up with pulmonology      Relevant Medications   methylPREDNISolone (MEDROL) 4 MG tablet   Macrocytic anemia    Check a vitamin B12 level and monitor      Decreased visual acuity    And now several weeks of conjunctivitis      Other Visit Diagnoses     Conjunctivitis, unspecified conjunctivitis type, unspecified laterality       Relevant Orders   Ambulatory referral to Ophthalmology       Meds ordered this encounter  Medications   neomycin-polymyxin-hydrocortisone (CORTISPORIN) 3.5-10000-1 ophthalmic suspension    Sig: Place 3 drops into both eyes 3 (three) times daily.    Dispense:  7.5 mL    Refill:  0  azithromycin (ZITHROMAX) 250 MG tablet    Sig: Take 2 tablets on day 1, then 1 tablet daily on days 2 through 5    Dispense:  6 tablet    Refill:  0   methylPREDNISolone (MEDROL) 4 MG tablet    Sig: 5 tabs po x 1 day then 4 tabs po x 1 day then 3 tabs po x 1 day then 2 tabs po x 1 day then 1 tab po x 1 day and stop    Dispense:  15 tablet    Refill:  0   I, Penni Homans, MD, personally preformed the services described in this documentation.  All medical record entries made by the scribe were at my direction and in my presence.  I have reviewed the chart and discharge instructions (if applicable) and agree that the record reflects my personal performance and is accurate and complete. 02/16/2022  I,Mohammed Iqbal,acting as a scribe for Penni Homans, MD.,have documented all relevant documentation on the behalf of Penni Homans, MD,as directed by  Penni Homans, MD while in the presence of Penni Homans, MD.  Penni Homans, MD

## 2022-02-21 ENCOUNTER — Encounter: Payer: Self-pay | Admitting: Family Medicine

## 2022-02-22 ENCOUNTER — Other Ambulatory Visit: Payer: Self-pay | Admitting: Family Medicine

## 2022-02-22 DIAGNOSIS — H109 Unspecified conjunctivitis: Secondary | ICD-10-CM

## 2022-02-22 DIAGNOSIS — H547 Unspecified visual loss: Secondary | ICD-10-CM

## 2022-02-23 DIAGNOSIS — F331 Major depressive disorder, recurrent, moderate: Secondary | ICD-10-CM | POA: Diagnosis not present

## 2022-02-23 DIAGNOSIS — F411 Generalized anxiety disorder: Secondary | ICD-10-CM | POA: Diagnosis not present

## 2022-03-01 ENCOUNTER — Encounter: Payer: Self-pay | Admitting: Family Medicine

## 2022-03-04 DIAGNOSIS — Z83511 Family history of glaucoma: Secondary | ICD-10-CM | POA: Diagnosis not present

## 2022-03-04 DIAGNOSIS — H0102B Squamous blepharitis left eye, upper and lower eyelids: Secondary | ICD-10-CM | POA: Diagnosis not present

## 2022-03-04 DIAGNOSIS — H0102A Squamous blepharitis right eye, upper and lower eyelids: Secondary | ICD-10-CM | POA: Diagnosis not present

## 2022-03-10 ENCOUNTER — Other Ambulatory Visit: Payer: Self-pay | Admitting: Family Medicine

## 2022-03-18 ENCOUNTER — Encounter: Payer: Self-pay | Admitting: General Practice

## 2022-03-20 ENCOUNTER — Other Ambulatory Visit: Payer: Self-pay | Admitting: Family Medicine

## 2022-03-21 ENCOUNTER — Encounter: Payer: Self-pay | Admitting: Family Medicine

## 2022-03-28 NOTE — Assessment & Plan Note (Signed)
Monitor and report any concerns, no changes to meds. Encouraged heart healthy diet such as the DASH diet and exercise as tolerated.  ?

## 2022-03-28 NOTE — Assessment & Plan Note (Addendum)
Her family has confronted her with concerns about an eating disorder, she is not convinced but is considering. She notes it is related to loss of appetite and abdominal pain with cramping upon eating at times. Encouraged small, frequent meals with minimal processed and fatty foods, hydrate well and continue to monitor

## 2022-03-28 NOTE — Assessment & Plan Note (Signed)
Encourage heart healthy diet such as MIND or DASH diet, increase exercise, avoid trans fats, simple carbohydrates and processed foods, consider a krill or fish or flaxseed oil cap daily.  °

## 2022-03-29 ENCOUNTER — Other Ambulatory Visit (HOSPITAL_BASED_OUTPATIENT_CLINIC_OR_DEPARTMENT_OTHER): Payer: Self-pay | Admitting: Family Medicine

## 2022-03-29 ENCOUNTER — Encounter (HOSPITAL_BASED_OUTPATIENT_CLINIC_OR_DEPARTMENT_OTHER): Payer: Self-pay

## 2022-03-29 ENCOUNTER — Ambulatory Visit (HOSPITAL_BASED_OUTPATIENT_CLINIC_OR_DEPARTMENT_OTHER)
Admission: RE | Admit: 2022-03-29 | Discharge: 2022-03-29 | Disposition: A | Discharge status: Home / Self Care | Payer: Medicare PPO | Source: Ambulatory Visit | Attending: Family Medicine | Admitting: Family Medicine

## 2022-03-29 ENCOUNTER — Ambulatory Visit (INDEPENDENT_AMBULATORY_CARE_PROVIDER_SITE_OTHER): Payer: Medicare PPO | Admitting: Family Medicine

## 2022-03-29 ENCOUNTER — Other Ambulatory Visit (HOSPITAL_BASED_OUTPATIENT_CLINIC_OR_DEPARTMENT_OTHER): Payer: Self-pay

## 2022-03-29 VITALS — BP 122/72 | HR 65 | Temp 97.5°F | Resp 16 | Ht 64.0 in | Wt 117.0 lb

## 2022-03-29 DIAGNOSIS — R739 Hyperglycemia, unspecified: Secondary | ICD-10-CM

## 2022-03-29 DIAGNOSIS — R197 Diarrhea, unspecified: Secondary | ICD-10-CM

## 2022-03-29 DIAGNOSIS — I10 Essential (primary) hypertension: Secondary | ICD-10-CM | POA: Diagnosis not present

## 2022-03-29 DIAGNOSIS — E559 Vitamin D deficiency, unspecified: Secondary | ICD-10-CM

## 2022-03-29 DIAGNOSIS — E538 Deficiency of other specified B group vitamins: Secondary | ICD-10-CM

## 2022-03-29 DIAGNOSIS — R109 Unspecified abdominal pain: Secondary | ICD-10-CM | POA: Diagnosis not present

## 2022-03-29 DIAGNOSIS — R634 Abnormal weight loss: Secondary | ICD-10-CM

## 2022-03-29 DIAGNOSIS — Z1231 Encounter for screening mammogram for malignant neoplasm of breast: Secondary | ICD-10-CM

## 2022-03-29 DIAGNOSIS — H109 Unspecified conjunctivitis: Secondary | ICD-10-CM

## 2022-03-29 DIAGNOSIS — J329 Chronic sinusitis, unspecified: Secondary | ICD-10-CM

## 2022-03-29 DIAGNOSIS — E785 Hyperlipidemia, unspecified: Secondary | ICD-10-CM

## 2022-03-29 MED ORDER — DOXYCYCLINE HYCLATE 100 MG PO TABS: 100.0000 mg | ORAL_TABLET | Freq: Two times a day (BID) | ORAL | 0 refills | Status: DC

## 2022-03-29 MED ORDER — PREVNAR 20 0.5 ML IM SUSY
PREFILLED_SYRINGE | INTRAMUSCULAR | 0 refills | Status: DC
  Filled 2022-03-29: qty 0.5, 1d supply, fill #0

## 2022-03-29 NOTE — Patient Instructions (Signed)
Diarrhea, Adult Diarrhea is frequent loose and sometimes watery bowel movements. Diarrhea can make you feel weak and cause you to become dehydrated. Dehydration is a condition in which there is not enough water or other fluids in the body. Dehydration can make you tired and thirsty, cause you to have a dry mouth, and decrease how often you urinate. Diarrhea typically lasts 2-3 days. However, it can last longer if it is a sign of something more serious. It is important to treat your diarrhea as told by your health care provider. Follow these instructions at home: Eating and drinking     Follow these recommendations as told by your health care provider: Take an oral rehydration solution (ORS). This is an over-the-counter medicine that helps return your body to its normal balance of nutrients and water. It is found at pharmacies and retail stores. Drink enough fluid to keep your urine pale yellow. Drink fluids such as water, diluted fruit juice, and low-calorie sports drinks. You can drink milk also, if desired. Sucking on ice chips is another way to get fluids. Avoid drinking fluids that contain a lot of sugar or caffeine, such as soda, energy drinks, and regular sports drinks. Avoid alcohol. Eat bland, easy-to-digest foods in small amounts as you are able. These foods include bananas, applesauce, rice, lean meats, toast, and crackers. Avoid spicy or fatty foods.  Medicines Take over-the-counter and prescription medicines only as told by your health care provider. If you were prescribed antibiotics, take them as told by your health care provider. Do not stop using the antibiotic even if you start to feel better. General instructions  Wash your hands often using soap and water for at least 20 seconds. If soap and water are not available, use hand sanitizer. Others in the household should wash their hands as well. Hands should be washed: After using the toilet or changing a diaper. Before  preparing, cooking, or serving food. While caring for a sick person or while visiting someone in a hospital. Rest at home while you recover. Take a warm bath to relieve any burning or pain from frequent diarrhea episodes. Watch your condition for any changes. Contact a health care provider if: You have a fever. Your diarrhea gets worse. You have new symptoms. You vomit every time you eat or drink. You feel light-headed, dizzy, or have a headache. You have muscle cramps. You have signs of dehydration, such as: Dark urine, very little urine, or no urine. Cracked lips. Dry mouth. Sunken eyes. Sleepiness. Weakness. You have bloody or black stools or stools that look like tar. You have severe pain, cramping, or bloating in your abdomen. Your skin feels cold and clammy. You feel confused. Get help right away if: You have chest pain or your heart is beating very quickly. You have trouble breathing or you are breathing very quickly. You feel extremely weak or you faint. These symptoms may be an emergency. Get help right away. Call 911. Do not wait to see if the symptoms will go away. Do not drive yourself to the hospital. This information is not intended to replace advice given to you by your health care provider. Make sure you discuss any questions you have with your health care provider. Document Revised: 07/14/2021 Document Reviewed: 07/14/2021 Elsevier Patient Education  2023 [employer]  

## 2022-03-30 LAB — COMPREHENSIVE METABOLIC PANEL
ALT: 12 U/L (ref 0–35)
AST: 20 U/L (ref 0–37)
Albumin: 4.1 g/dL (ref 3.5–5.2)
Alkaline Phosphatase: 27 U/L — ABNORMAL LOW (ref 39–117)
BUN: 19 mg/dL (ref 6–23)
CO2: 28 mEq/L (ref 19–32)
Calcium: 9.8 mg/dL (ref 8.4–10.5)
Chloride: 103 mEq/L (ref 96–112)
Creatinine, Ser: 0.92 mg/dL (ref 0.40–1.20)
GFR: 63.5 mL/min (ref >60.00)
Glucose, Bld: 81 mg/dL (ref 70–99)
Potassium: 4.4 mEq/L (ref 3.5–5.1)
Sodium: 136 mEq/L (ref 135–145)
Total Bilirubin: 0.4 mg/dL (ref 0.2–1.2)
Total Protein: 6.8 g/dL (ref 6.0–8.3)

## 2022-03-30 LAB — SEDIMENTATION RATE: Sed Rate: 1 mm/hr (ref 0–30)

## 2022-03-30 LAB — HIGH SENSITIVITY CRP: CRP, High Sensitivity: 4.05 mg/L (ref 0.000–5.000)

## 2022-03-30 LAB — TSH: TSH: 1.38 u[IU]/mL (ref 0.35–5.50)

## 2022-03-30 LAB — HEMOGLOBIN A1C: Hgb A1c MFr Bld: 5.2 % (ref 4.6–6.5)

## 2022-03-30 LAB — VITAMIN B12: Vitamin B-12: >1500 pg/mL — ABNORMAL HIGH (ref 211–911)

## 2022-03-30 LAB — VITAMIN D 25 HYDROXY (VIT D DEFICIENCY, FRACTURES): VITD: 38.42 ng/mL (ref 30.00–100.00)

## 2022-04-01 ENCOUNTER — Other Ambulatory Visit: Payer: Self-pay | Admitting: Family Medicine

## 2022-04-01 DIAGNOSIS — R928 Other abnormal and inconclusive findings on diagnostic imaging of breast: Secondary | ICD-10-CM

## 2022-04-04 DIAGNOSIS — H109 Unspecified conjunctivitis: Secondary | ICD-10-CM | POA: Insufficient documentation

## 2022-04-04 HISTORY — DX: Unspecified conjunctivitis: H10.9

## 2022-04-04 NOTE — Assessment & Plan Note (Signed)
Several weeks of nasal congestion and now yellow rhinorrhea with pressure in sinuses and behind eyes. Given rx for Doxycycline and take Mucinex bid

## 2022-04-04 NOTE — Assessment & Plan Note (Signed)
BRAT diet, increase fluids, CT abd/pelvis

## 2022-04-04 NOTE — Assessment & Plan Note (Signed)
Moist compresses four times a day

## 2022-04-04 NOTE — Assessment & Plan Note (Signed)
BRAT diet, increase fluids

## 2022-04-04 NOTE — Progress Notes (Signed)
Subjective:    Patient ID: Casey Rowe, female    DOB: July 12, 1950, 72 y.o.   MRN: [id number]  Chief Complaint  Patient presents with   Follow-up    Follow up    HPI Patient is in today for follow up on chronic and new medical concerns. No recent febrile illness but she continues to feel poorly. She is accompanied by her husband. Her larger family including her kids have recently confronted her with the idea they are worried she is struggling with an eating disorder. She does not thinks he does and her husband is non committal. She does note she has no appetite and often after eating she has abdominal pain and/or diarrhea. She avoids milk, takes IBGard and Imodium when she has to go out. Her sense of taste is compromised  and nothing sounds good, she has also struggled with nasal congestion, facial pressure, pressure behind both eyes. Denies CP/palp/SOB/fevers or GU c/o. Taking meds as prescribed   Past Medical History:  Diagnosis Date   Allergy    Anxiety    Asthma    Broken jaw ([place]) 1980s   Depression    Depression with anxiety    Hypertension    Preventative health care 03/16/2015   Sarcoidosis    Tubal pregnancy 1981    Past Surgical History:  Procedure Laterality Date   ABDOMINAL HYSTERECTOMY     [place]   broken jaw    Family History  Problem Relation Age of Onset   Hypertension Mother    Dementia Mother    Diabetes Mother    Diabetes Father    Heart attack Father    Heart disease Father    Psoriasis Father    Cancer Sister        pancreatic   Alcohol abuse Sister        substance   Hepatitis B Brother    Gout Brother    Hypertension Brother    Glaucoma Brother    Heart disease Brother    Rashes / Skin problems Son    Other Son        bad allergies, aspirin, dermatitis   Rashes / Skin problems Son    Cancer Maternal Aunt        breast   Breast cancer Maternal Aunt     Social  History   Socioeconomic History   Marital status: Married    Spouse name: Not on file   Number of children: Not on file   Years of education: Not on file   Highest education level: Not on file  Occupational History   Not on file  Tobacco Use   Smoking status: Never   Smokeless tobacco: Never  Vaping Use   Vaping Use: Never used  Substance and Sexual Activity   Alcohol use: Yes   Drug use: No   Sexual activity: Yes    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[employer], lives with husband, no dietary, minimal red meat  Other Topics Concern   Not on file  Social History Narrative   Works as a Pharmacist, 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[employer])   Married   Grown children- 3         Social Determinants of Radio broadcast assistant Strain: Not on file  Food Insecurity: Not on file  Transportation Needs: Not on file  Physical Activity: Not on file  Stress: Not on  file  Social Connections: Not on file  Intimate Partner Violence: Not on file    Outpatient Medications Prior to Visit  Medication Sig Dispense Refill   albuterol (VENTOLIN HFA) 108 (90 Base) MCG/ACT inhaler INHALE 2 PUFFS INTO THE LUNGS EVERY 6 HOURS AS NEEDED FOR WHEEZING 18 g 5   albuterol (VENTOLIN HFA) 108 (90 Base) MCG/ACT inhaler Inhale 1-2 puffs into the lungs every 4 (four) hours as needed for wheezing or shortness of breath. 1 each 5   ALPRAZolam (XANAX) 0.25 MG tablet Take 0.25 mg by mouth daily.      azelastine (ASTELIN) 0.1 % nasal spray Place 2 sprays into both nostrils 2 (two) times daily. Use in each nostril as directed 30 mL 12   buPROPion (WELLBUTRIN XL) 300 MG 24 hr tablet Take 300 mg by mouth daily.     busPIRone (BUSPAR) 5 MG tablet Take 5 mg by mouth 3 (three) times daily.      estradiol (ESTRACE) 1 MG tablet TAKE 1 TABLET BY MOUTH DAILY 90 tablet 1   fenofibrate micronized (LOFIBRA) 134 MG capsule TAKE ONE CAPSULE BY MOUTH EVERY MORNING BEFORE BREAKFAST 90 capsule 0   fluticasone-salmeterol (ADVAIR DISKUS) 250-50 MCG/ACT AEPB  Inhale 1 puff into the lungs in the morning and at bedtime. 60 each 5   influenza vaccine adjuvanted (FLUAD QUADRIVALENT) 0.5 ML injection Inject into the muscle. 0.5 mL 0   losartan (COZAAR) 50 MG tablet TAKE 1 TABLET BY MOUTH DAILY 90 tablet 1   methylPREDNISolone (MEDROL) 4 MG tablet 5 tabs po x 1 day then 4 tabs po x 1 day then 3 tabs po x 1 day then 2 tabs po x 1 day then 1 tab po x 1 day and stop 15 tablet 0   metoprolol succinate (TOPROL-XL) 50 MG 24 hr tablet TAKE 1 TABLET BY MOUTH DAILY WITH A MEAL 90 tablet 1   neomycin-polymyxin-hydrocortisone (CORTISPORIN) 3.5-10000-1 ophthalmic suspension Place 3 drops into both eyes 3 (three) times daily. 7.5 mL 0   pneumococcal 20-valent conjugate vaccine (PREVNAR 20) 0.5 ML injection Inject into the muscle. 0.5 mL 0   progesterone (PROMETRIUM) 100 MG capsule TAKE ONE CAPSULE BY MOUTH DAILY 90 capsule 1   progesterone (PROMETRIUM) 100 MG capsule TAKE 1 CAPSULE BY MOUTH DAILY 90 capsule 1   saccharomyces boulardii (FLORASTOR) 250 MG capsule Take 250 mg by mouth daily.     No facility-administered medications prior to visit.    Allergies  Allergen Reactions   Demerol Nausea And Vomiting   Singulair [Montelukast]     Dry mouth/dehydration   Augmentin [Amoxicillin-Pot Clavulanate] Diarrhea    Caused Diarrhea    Review of Systems  Constitutional:  Positive for malaise/fatigue. Negative for fever.  HENT:  Positive for congestion and sinus pain. Negative for nosebleeds.   Eyes:  Negative for blurred vision.  Respiratory:  Positive for sputum production. Negative for shortness of breath.   Cardiovascular:  Negative for chest pain, palpitations and leg swelling.  Gastrointestinal:  Positive for abdominal pain and diarrhea. Negative for blood in stool and nausea.  Genitourinary:  Negative for dysuria and frequency.  Musculoskeletal:  Negative for falls.  Skin:  Negative for rash.  Neurological:  Negative for dizziness, loss of consciousness and  headaches.  Endo/Heme/Allergies:  Negative for environmental allergies.  Psychiatric/Behavioral:  Negative for depression. The patient is not nervous/anxious.        Objective:    Physical Exam Constitutional:      General: She is  not in acute distress.    Appearance: Normal appearance. She is not diaphoretic.  HENT:     Head: Normocephalic and atraumatic.     Right Ear: Tympanic membrane, ear canal and external ear normal.     Left Ear: Tympanic membrane, ear canal and external ear normal.     Nose: Congestion and rhinorrhea present.     Mouth/Throat:     Mouth: Mucous membranes are moist.     Pharynx: Oropharynx is clear. No oropharyngeal exudate.  Eyes:     General: No scleral icterus.       Right eye: No discharge.        Left eye: No discharge.     Conjunctiva/sclera: Conjunctivae normal.     Pupils: Pupils are equal, round, and reactive to light.  Neck:     Thyroid: No thyromegaly.  Cardiovascular:     Rate and Rhythm: Normal rate and regular rhythm.     Heart sounds: Normal heart sounds. No murmur heard. Pulmonary:     Effort: Pulmonary effort is normal. No respiratory distress.     Breath sounds: Normal breath sounds. No wheezing or rales.  Abdominal:     General: Bowel sounds are normal. There is no distension.     Palpations: Abdomen is soft. There is no mass.     Tenderness: There is no abdominal tenderness.  Musculoskeletal:        General: No tenderness. Normal range of motion.     Cervical back: Normal range of motion and neck supple.  Lymphadenopathy:     Cervical: No cervical adenopathy.  Skin:    General: Skin is warm and dry.     Findings: No rash.  Neurological:     General: No focal deficit present.     Mental Status: She is alert and oriented to person, place, and time.     Cranial Nerves: No cranial nerve deficit.     Coordination: Coordination normal.     Deep Tendon Reflexes: Reflexes are normal and symmetric. Reflexes normal.  Psychiatric:         Mood and Affect: Mood normal.        Behavior: Behavior normal.        Thought Content: Thought content normal.        Judgment: Judgment normal.     BP 122/72 (BP Location: Right Arm, Patient Position: Sitting, Cuff Size: Small)   Pulse 65   Temp (!) 97.5 F (36.4 C)   Resp 16   Ht '5\' 4"'$  (1.626 m)   Wt 117 lb (53.1 kg)   LMP 02/08/1997   SpO2 97%   BMI 20.08 kg/m  Wt Readings from Last 3 Encounters:  03/29/22 117 lb (53.1 kg)  02/16/22 119 lb 3.2 oz (54.1 kg)  01/04/22 117 lb 6.4 oz (53.3 kg)    Diabetic Foot Exam - Simple   No data filed    Lab Results  Component Value Date   WBC 4.5 02/16/2022   HGB 13.3 02/16/2022   HCT 39.6 02/16/2022   PLT 237.0 02/16/2022   GLUCOSE 81 03/29/2022   CHOL 152 02/16/2022   TRIG 115.0 02/16/2022   HDL 61.70 02/16/2022   LDLDIRECT 50.0 11/14/2020   LDLCALC 67 02/16/2022   ALT 12 03/29/2022   AST 20 03/29/2022   NA 136 03/29/2022   K 4.4 03/29/2022   CL 103 03/29/2022   CREATININE 0.92 03/29/2022   BUN 19 03/29/2022   CO2 28 03/29/2022   TSH  1.38 03/29/2022   HGBA1C 5.2 03/29/2022    Lab Results  Component Value Date   TSH 1.38 03/29/2022   Lab Results  Component Value Date   WBC 4.5 02/16/2022   HGB 13.3 02/16/2022   HCT 39.6 02/16/2022   MCV 101.3 (H) 02/16/2022   PLT 237.0 02/16/2022   Lab Results  Component Value Date   NA 136 03/29/2022   K 4.4 03/29/2022   CO2 28 03/29/2022   GLUCOSE 81 03/29/2022   BUN 19 03/29/2022   CREATININE 0.92 03/29/2022   BILITOT 0.4 03/29/2022   ALKPHOS 27 (L) 03/29/2022   AST 20 03/29/2022   ALT 12 03/29/2022   PROT 6.8 03/29/2022   ALBUMIN 4.1 03/29/2022   CALCIUM 9.8 03/29/2022   GFR 63.50 03/29/2022   Lab Results  Component Value Date   CHOL 152 02/16/2022   Lab Results  Component Value Date   HDL 61.70 02/16/2022   Lab Results  Component Value Date   LDLCALC 67 02/16/2022   Lab Results  Component Value Date   TRIG 115.0 02/16/2022   Lab  Results  Component Value Date   CHOLHDL 2 02/16/2022   Lab Results  Component Value Date   HGBA1C 5.2 03/29/2022       Assessment & Plan:  Primary hypertension Assessment & Plan: Monitor and report any concerns, no changes to meds. Encouraged heart healthy diet such as the DASH diet and exercise as tolerated.   Orders: -     Comprehensive metabolic panel -     TSH  Weight loss Assessment & Plan: Her family has confronted her with concerns about an eating disorder, she is not convinced but is considering. She notes it is related to loss of appetite and abdominal pain with cramping upon eating at times. Encouraged small, frequent meals with minimal processed and fatty foods, hydrate well and continue to monitor   Hyperlipidemia, unspecified hyperlipidemia type Assessment & Plan: Encourage heart healthy diet such as MIND or DASH diet, increase exercise, avoid trans fats, simple carbohydrates and processed foods, consider a krill or fish or flaxseed oil cap daily.    Abdominal pain, unspecified abdominal location Assessment & Plan: BRAT diet, increase fluids, CT abd/pelvis  Orders: -     CT ABDOMEN PELVIS W WO CONTRAST; Future -     High sensitivity CRP -     Sedimentation rate  Vitamin B 12 deficiency -     Vitamin B12  Vitamin D deficiency -     VITAMIN D 25 Hydroxy (Vit-D Deficiency, Fractures)  Diarrhea, unspecified type Assessment & Plan: BRAT diet, increase fluids   Hyperglycemia, unspecified -     Hemoglobin A1c  Conjunctivitis, unspecified conjunctivitis type, unspecified laterality Assessment & Plan: Moist compresses four times a day   Sinusitis, unspecified chronicity, unspecified location Assessment & Plan: Several weeks of nasal congestion and now yellow rhinorrhea with pressure in sinuses and behind eyes. Given rx for Doxycycline and take Mucinex bid   Other orders -     Doxycycline Hyclate; Take 1 tablet (100 mg total) by mouth 2 (two) times  daily.  Dispense: 20 tablet; Refill: 0    Penni Homans, MD

## 2022-04-12 ENCOUNTER — Encounter: Payer: Self-pay | Admitting: Family Medicine

## 2022-04-12 ENCOUNTER — Ambulatory Visit (HOSPITAL_BASED_OUTPATIENT_CLINIC_OR_DEPARTMENT_OTHER)
Admission: RE | Admit: 2022-04-12 | Discharge: 2022-04-12 | Disposition: A | Discharge status: Home / Self Care | Payer: Medicare PPO | Source: Ambulatory Visit | Attending: Family Medicine | Admitting: Family Medicine

## 2022-04-12 ENCOUNTER — Encounter (HOSPITAL_BASED_OUTPATIENT_CLINIC_OR_DEPARTMENT_OTHER): Payer: Self-pay

## 2022-04-12 DIAGNOSIS — R634 Abnormal weight loss: Secondary | ICD-10-CM | POA: Diagnosis not present

## 2022-04-12 DIAGNOSIS — K439 Ventral hernia without obstruction or gangrene: Secondary | ICD-10-CM | POA: Diagnosis not present

## 2022-04-12 DIAGNOSIS — R109 Unspecified abdominal pain: Secondary | ICD-10-CM | POA: Diagnosis not present

## 2022-04-12 DIAGNOSIS — R319 Hematuria, unspecified: Secondary | ICD-10-CM | POA: Diagnosis not present

## 2022-04-12 DIAGNOSIS — K4090 Unilateral inguinal hernia, without obstruction or gangrene, not specified as recurrent: Secondary | ICD-10-CM | POA: Diagnosis not present

## 2022-04-12 MED ORDER — IOHEXOL 300 MG/ML  SOLN
100.0000 mL | Freq: Once | INTRAMUSCULAR | Status: AC | PRN
  Administered 2022-04-12: 100 mL via INTRAVENOUS

## 2022-04-13 ENCOUNTER — Encounter: Payer: Self-pay | Admitting: Family Medicine

## 2022-04-13 DIAGNOSIS — F411 Generalized anxiety disorder: Secondary | ICD-10-CM | POA: Diagnosis not present

## 2022-04-13 DIAGNOSIS — F331 Major depressive disorder, recurrent, moderate: Secondary | ICD-10-CM | POA: Diagnosis not present

## 2022-04-14 ENCOUNTER — Other Ambulatory Visit: Payer: Self-pay | Admitting: Family Medicine

## 2022-04-14 DIAGNOSIS — R634 Abnormal weight loss: Secondary | ICD-10-CM

## 2022-04-14 DIAGNOSIS — R197 Diarrhea, unspecified: Secondary | ICD-10-CM

## 2022-04-15 ENCOUNTER — Ambulatory Visit: Payer: Medicare PPO

## 2022-04-15 ENCOUNTER — Ambulatory Visit
Admission: RE | Admit: 2022-04-15 | Discharge: 2022-04-15 | Disposition: A | Discharge status: Home / Self Care | Payer: Medicare PPO | Source: Ambulatory Visit | Attending: Family Medicine | Admitting: Family Medicine

## 2022-04-15 DIAGNOSIS — R928 Other abnormal and inconclusive findings on diagnostic imaging of breast: Secondary | ICD-10-CM | POA: Diagnosis not present

## 2022-04-16 ENCOUNTER — Encounter: Payer: Self-pay | Admitting: Family Medicine

## 2022-04-21 ENCOUNTER — Other Ambulatory Visit: Payer: Self-pay | Admitting: Family Medicine

## 2022-04-21 DIAGNOSIS — K4090 Unilateral inguinal hernia, without obstruction or gangrene, not specified as recurrent: Secondary | ICD-10-CM

## 2022-04-21 DIAGNOSIS — R109 Unspecified abdominal pain: Secondary | ICD-10-CM

## 2022-04-21 DIAGNOSIS — K439 Ventral hernia without obstruction or gangrene: Secondary | ICD-10-CM

## 2022-04-23 DIAGNOSIS — Z83511 Family history of glaucoma: Secondary | ICD-10-CM | POA: Diagnosis not present

## 2022-04-23 DIAGNOSIS — H0102A Squamous blepharitis right eye, upper and lower eyelids: Secondary | ICD-10-CM | POA: Diagnosis not present

## 2022-04-23 DIAGNOSIS — H0102B Squamous blepharitis left eye, upper and lower eyelids: Secondary | ICD-10-CM | POA: Diagnosis not present

## 2022-05-11 DIAGNOSIS — K828 Other specified diseases of gallbladder: Secondary | ICD-10-CM | POA: Diagnosis not present

## 2022-05-13 ENCOUNTER — Other Ambulatory Visit (HOSPITAL_COMMUNITY): Payer: Self-pay | Admitting: General Surgery

## 2022-05-13 DIAGNOSIS — K828 Other specified diseases of gallbladder: Secondary | ICD-10-CM

## 2022-05-24 ENCOUNTER — Encounter: Payer: Self-pay | Admitting: *Deleted

## 2022-06-02 ENCOUNTER — Encounter (HOSPITAL_COMMUNITY): Admission: RE | Admit: 2022-06-02 | Payer: Medicare PPO | Source: Ambulatory Visit

## 2022-06-02 ENCOUNTER — Encounter (HOSPITAL_COMMUNITY)
Admission: RE | Admit: 2022-06-02 | Discharge: 2022-06-02 | Disposition: A | Discharge status: Home / Self Care | Payer: Medicare PPO | Source: Ambulatory Visit | Attending: General Surgery | Admitting: General Surgery

## 2022-06-02 DIAGNOSIS — K828 Other specified diseases of gallbladder: Secondary | ICD-10-CM | POA: Diagnosis not present

## 2022-06-02 DIAGNOSIS — R109 Unspecified abdominal pain: Secondary | ICD-10-CM | POA: Diagnosis not present

## 2022-06-02 MED ORDER — TECHNETIUM TC 99M MEBROFENIN IV KIT
4.8000 | PACK | Freq: Once | INTRAVENOUS | Status: AC | PRN
  Administered 2022-06-02: 4.8 via INTRAVENOUS

## 2022-06-07 ENCOUNTER — Encounter: Payer: Self-pay | Admitting: Family Medicine

## 2022-06-08 ENCOUNTER — Encounter: Payer: Self-pay | Admitting: Family Medicine

## 2022-06-08 ENCOUNTER — Telehealth (INDEPENDENT_AMBULATORY_CARE_PROVIDER_SITE_OTHER): Payer: Medicare PPO | Admitting: Family Medicine

## 2022-06-08 VITALS — BP 123/77 | HR 71

## 2022-06-08 DIAGNOSIS — F418 Other specified anxiety disorders: Secondary | ICD-10-CM | POA: Diagnosis not present

## 2022-06-08 DIAGNOSIS — R197 Diarrhea, unspecified: Secondary | ICD-10-CM | POA: Diagnosis not present

## 2022-06-08 MED ORDER — CHOLESTYRAMINE 4 G PO PACK: 4.0000 g | PACK | Freq: Two times a day (BID) | ORAL | 3 refills | Status: DC | PRN

## 2022-06-08 MED ORDER — IBGARD 90 MG PO CPCR: 2.0000 | ORAL_CAPSULE | Freq: Two times a day (BID) | ORAL | 5 refills | Status: DC | PRN

## 2022-06-08 NOTE — Telephone Encounter (Signed)
Called pt appt was made for today VV at 340.

## 2022-06-08 NOTE — Progress Notes (Signed)
MyChart Video Visit Virtual Visit via Video Note   This visit type was conducted due to national recommendations for restrictions regarding the COVID-19 Pandemic (e.g. social distancing) in an effort to limit this patient's exposure and mitigate transmission in our community. This patient is at least at moderate risk for complications without adequate follow up. This format is felt to be most appropriate for this patient at this time. Physical exam was limited by quality of the video and audio technology used for the visit. Shamaine, CMA, was able to get the patient set up on a video visit.  Patient location: Patient's home Provider location: Office  I discussed the limitations of evaluation and management by telemedicine and the availability of in person appointments. The patient expressed understanding and agreed to proceed.  Visit Date: 06/08/2022  Today's healthcare provider: Danise Edge, MD     Subjective:    Patient ID: Casey Rowe, female    DOB: [birth year], 72 y.o.   MRN: [id number]  No chief complaint on file.  HPI Patient is in today for a virtual visit.  Chronic Diarrhea/Depression Patient is visibly upset during today's visit and feels hopeless about managing her chronic diarrhea. She states that she is experiencing diarrhea every day which has ruined her marriage by deterring her husband from being sexually active with her. This has consequentially led to her kicking him out of their home. She further states that she is extremely conscious about her diet and is using 2 IBgard twice daily as needed. She is requesting a prescription for this due to it being so expensive, though it is improbable that her insurance will cover this. She is also using Imodium as needed when the diarrhea is severe. She reports that she is having only one episode of diarrhea when taking her medications but is now experiencing mild cramps. She denies CP/palpitations/SOB/HA/fever/chills/or GU  symptoms. Additionally, she has taken vital measurements at home which are:  BP-123/77 Pulse-71 Weight-114 lbs  Past Medical History:  Diagnosis Date   Allergy    Anxiety    Asthma    Broken jaw ([facility]) 1980s   Depression    Depression with anxiety    Hypertension    Preventative health care 03/16/2015   Sarcoidosis    Tubal pregnancy 1981    Past Surgical History:  Procedure Laterality Date   ABDOMINAL HYSTERECTOMY     CESAREAN SECTION     ECTOPIC PREGNANCY SURGERY  1981   FRACTURE SURGERY  1980s   broken jaw    Family History  Problem Relation Age of Onset   Hypertension Mother    Dementia Mother    Diabetes Mother    Diabetes Father    Heart attack Father    Heart disease Father    Psoriasis Father    Cancer Sister        pancreatic   Alcohol abuse Sister        substance   Hepatitis B Brother    Gout Brother    Hypertension Brother    Glaucoma Brother    Heart disease Brother    Rashes / Skin problems Son    Other Son        bad allergies, aspirin, dermatitis   Rashes / Skin problems Son    Cancer Maternal Aunt        breast   Breast cancer Maternal Aunt     Social History   Socioeconomic History   Marital status: Married    Spouse  name: Not on file   Number of children: Not on file   Years of education: Not on file   Highest education level: Not on file  Occupational History   Not on file  Tobacco Use   Smoking status: Never   Smokeless tobacco: Never  Vaping Use   Vaping Use: Never used  Substance and Sexual Activity   Alcohol use: Yes   Drug use: No   Sexual activity: Yes    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[employer], lives with husband, 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, minimal red meat  Other Topics Concern   Not on file  Social History Narrative   Works as a Runner, 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[place])   Married   Grown children- 3         Social Determinants of Corporate investment banker Strain: Not on file  Food Insecurity: Not on file  Transportation Needs: Not on  file  Physical Activity: Not on file  Stress: Not on file  Social Connections: Not on file  Intimate Partner Violence: Not on file    Outpatient Medications Prior to Visit  Medication Sig Dispense Refill   albuterol (VENTOLIN HFA) 108 (90 Base) MCG/ACT inhaler INHALE 2 PUFFS INTO THE LUNGS EVERY 6 HOURS AS NEEDED FOR WHEEZING 18 g 5   albuterol (VENTOLIN HFA) 108 (90 Base) MCG/ACT inhaler Inhale 1-2 puffs into the lungs every 4 (four) hours as needed for wheezing or shortness of breath. 1 each 5   ALPRAZolam (XANAX) 0.25 MG tablet Take 0.25 mg by mouth daily.      azelastine (ASTELIN) 0.1 % nasal spray Place 2 sprays into both nostrils 2 (two) times daily. Use in each nostril as directed 30 mL 12   buPROPion (WELLBUTRIN XL) 300 MG 24 hr tablet Take 300 mg by mouth daily.     busPIRone (BUSPAR) 5 MG tablet Take 5 mg by mouth 3 (three) times daily.      doxycycline (VIBRA-TABS) 100 MG tablet Take 1 tablet (100 mg total) by mouth 2 (two) times daily. 20 tablet 0   estradiol (ESTRACE) 1 MG tablet TAKE 1 TABLET BY MOUTH DAILY 90 tablet 1   fenofibrate micronized (LOFIBRA) 134 MG capsule TAKE ONE CAPSULE BY MOUTH EVERY MORNING BEFORE BREAKFAST 90 capsule 0   fluticasone-salmeterol (ADVAIR DISKUS) 250-50 MCG/ACT AEPB Inhale 1 puff into the lungs in the morning and at bedtime. 60 each 5   influenza vaccine adjuvanted (FLUAD QUADRIVALENT) 0.5 ML injection Inject into the muscle. 0.5 mL 0   losartan (COZAAR) 50 MG tablet TAKE 1 TABLET BY MOUTH DAILY 90 tablet 1   methylPREDNISolone (MEDROL) 4 MG tablet 5 tabs po x 1 day then 4 tabs po x 1 day then 3 tabs po x 1 day then 2 tabs po x 1 day then 1 tab po x 1 day and stop 15 tablet 0   metoprolol succinate (TOPROL-XL) 50 MG 24 hr tablet TAKE 1 TABLET BY MOUTH DAILY WITH A MEAL 90 tablet 1   neomycin-polymyxin-hydrocortisone (CORTISPORIN) 3.5-10000-1 ophthalmic suspension Place 3 drops into both eyes 3 (three) times daily. 7.5 mL 0   pneumococcal 20-valent  conjugate vaccine (PREVNAR 20) 0.5 ML injection Inject into the muscle. 0.5 mL 0   progesterone (PROMETRIUM) 100 MG capsule TAKE ONE CAPSULE BY MOUTH DAILY 90 capsule 1   progesterone (PROMETRIUM) 100 MG capsule TAKE 1 CAPSULE BY MOUTH DAILY 90 capsule 1   saccharomyces boulardii (FLORASTOR) 250 MG capsule Take 250 mg by mouth daily.  No facility-administered medications prior to visit.    Allergies  Allergen Reactions   Demerol Nausea And Vomiting   Singulair [Montelukast]     Dry mouth/dehydration   Augmentin [Amoxicillin-Pot Clavulanate] Diarrhea    Caused Diarrhea    Review of Systems  Constitutional:  Negative for chills and fever.  Respiratory:  Negative for shortness of breath.   Cardiovascular:  Negative for chest pain and palpitations.  Gastrointestinal:  Positive for diarrhea. Negative for nausea and vomiting.       (+) cramps.  Genitourinary:  Negative for dysuria, frequency, hematuria and urgency.  Skin:           Neurological:  Negative for headaches.       Objective:    Physical Exam  LMP 02/08/1997  Wt Readings from Last 3 Encounters:  03/29/22 117 lb (53.1 kg)  02/16/22 119 lb 3.2 oz (54.1 kg)  01/04/22 117 lb 6.4 oz (53.3 kg)    Diabetic Foot Exam - Simple   No data filed    Lab Results  Component Value Date   WBC 4.5 02/16/2022   HGB 13.3 02/16/2022   HCT 39.6 02/16/2022   PLT 237.0 02/16/2022   GLUCOSE 81 03/29/2022   CHOL 152 02/16/2022   TRIG 115.0 02/16/2022   HDL 61.70 02/16/2022   LDLDIRECT 50.0 11/14/2020   LDLCALC 67 02/16/2022   ALT 12 03/29/2022   AST 20 03/29/2022   NA 136 03/29/2022   K 4.4 03/29/2022   CL 103 03/29/2022   CREATININE 0.92 03/29/2022   BUN 19 03/29/2022   CO2 28 03/29/2022   TSH 1.38 03/29/2022   HGBA1C 5.2 03/29/2022    Lab Results  Component Value Date   TSH 1.38 03/29/2022   Lab Results  Component Value Date   WBC 4.5 02/16/2022   HGB 13.3 02/16/2022   HCT 39.6 02/16/2022   MCV 101.3 (H)  02/16/2022   PLT 237.0 02/16/2022   Lab Results  Component Value Date   NA 136 03/29/2022   K 4.4 03/29/2022   CO2 28 03/29/2022   GLUCOSE 81 03/29/2022   BUN 19 03/29/2022   CREATININE 0.92 03/29/2022   BILITOT 0.4 03/29/2022   ALKPHOS 27 (L) 03/29/2022   AST 20 03/29/2022   ALT 12 03/29/2022   PROT 6.8 03/29/2022   ALBUMIN 4.1 03/29/2022   CALCIUM 9.8 03/29/2022   GFR 63.50 03/29/2022   Lab Results  Component Value Date   CHOL 152 02/16/2022   Lab Results  Component Value Date   HDL 61.70 02/16/2022   Lab Results  Component Value Date   LDLCALC 67 02/16/2022   Lab Results  Component Value Date   TRIG 115.0 02/16/2022   Lab Results  Component Value Date   CHOLHDL 2 02/16/2022   Lab Results  Component Value Date   HGBA1C 5.2 03/29/2022      Assessment & Plan:  Chronic Diarrhea/Depression: Patient continues to experience diarrhea daily which has been causing depression. Cholestyramine 4 g packet and IBgard prescribed. Problem List Items Addressed This Visit   None  No orders of the defined types were placed in this encounter.  I discussed the assessment and treatment plan with the patient. The patient was provided an opportunity to ask questions and all were answered. The patient agreed with the plan and demonstrated an understanding of the instructions.   The patient was advised to call back or seek an in-person evaluation if the symptoms worsen or if the condition fails  to improve as anticipated.  I provided 20 minutes of face-to-face time during this encounter.  I,Mohammed Iqbal,acting as a scribe for Danise Edge, MD.,have documented all relevant documentation on the behalf of Danise Edge, MD,as directed by  Danise Edge, MD while in the presence of Danise Edge, MD.  Danise Edge, MD [facility] [phone] (phone) [phone] (fax)  [facility]

## 2022-06-09 ENCOUNTER — Encounter: Payer: Self-pay | Admitting: Family Medicine

## 2022-06-09 DIAGNOSIS — S060XAA Concussion with loss of consciousness status unknown, initial encounter: Secondary | ICD-10-CM

## 2022-06-09 HISTORY — DX: Concussion with loss of consciousness status unknown, initial encounter: S06.0XAA

## 2022-06-09 NOTE — Assessment & Plan Note (Signed)
She is tearful today due to chronic illness and life stressors including marriage trouble. She follows with psychiatry and will continue current meds for now but will reach out for care if needed.

## 2022-06-09 NOTE — Assessment & Plan Note (Addendum)
Patient is down to roughly 1 diarrhea episode daily but with significant discomfort. She has been seen by general surgery and they do not believe she is a surgical candidate and she has seen gastroenterology numerous times and is definitely frustrated. She does not want to return there at this time. She is being very careful with her diet. Will add Cholestyramine daily and increase as tolerated and as needed to up to 3 x a day. She finds IBGard helpful so a prescription is sent in

## 2022-06-11 ENCOUNTER — Other Ambulatory Visit: Payer: Self-pay | Admitting: Family Medicine

## 2022-06-21 ENCOUNTER — Other Ambulatory Visit: Payer: Self-pay

## 2022-06-21 ENCOUNTER — Encounter (HOSPITAL_BASED_OUTPATIENT_CLINIC_OR_DEPARTMENT_OTHER): Payer: Self-pay | Admitting: Urology

## 2022-06-21 ENCOUNTER — Emergency Department (HOSPITAL_BASED_OUTPATIENT_CLINIC_OR_DEPARTMENT_OTHER): Payer: Medicare PPO

## 2022-06-21 ENCOUNTER — Emergency Department (HOSPITAL_BASED_OUTPATIENT_CLINIC_OR_DEPARTMENT_OTHER)
Admission: EM | Admit: 2022-06-21 | Discharge: 2022-06-22 | Disposition: A | Discharge status: Home / Self Care | Payer: Medicare PPO | Attending: Emergency Medicine | Admitting: Emergency Medicine

## 2022-06-21 DIAGNOSIS — S199XXA Unspecified injury of neck, initial encounter: Secondary | ICD-10-CM | POA: Diagnosis not present

## 2022-06-21 DIAGNOSIS — Y92003 Bedroom of unspecified non-institutional (private) residence as the place of occurrence of the external cause: Secondary | ICD-10-CM | POA: Insufficient documentation

## 2022-06-21 DIAGNOSIS — J45909 Unspecified asthma, uncomplicated: Secondary | ICD-10-CM | POA: Diagnosis not present

## 2022-06-21 DIAGNOSIS — W1830XA Fall on same level, unspecified, initial encounter: Secondary | ICD-10-CM | POA: Diagnosis not present

## 2022-06-21 DIAGNOSIS — S060X1A Concussion with loss of consciousness of 30 minutes or less, initial encounter: Secondary | ICD-10-CM | POA: Diagnosis not present

## 2022-06-21 DIAGNOSIS — I10 Essential (primary) hypertension: Secondary | ICD-10-CM | POA: Diagnosis not present

## 2022-06-21 DIAGNOSIS — Z7951 Long term (current) use of inhaled steroids: Secondary | ICD-10-CM | POA: Diagnosis not present

## 2022-06-21 DIAGNOSIS — Z79899 Other long term (current) drug therapy: Secondary | ICD-10-CM | POA: Insufficient documentation

## 2022-06-21 DIAGNOSIS — S0083XA Contusion of other part of head, initial encounter: Secondary | ICD-10-CM | POA: Diagnosis not present

## 2022-06-21 DIAGNOSIS — R6889 Other general symptoms and signs: Secondary | ICD-10-CM | POA: Diagnosis not present

## 2022-06-21 DIAGNOSIS — S0081XA Abrasion of other part of head, initial encounter: Secondary | ICD-10-CM | POA: Insufficient documentation

## 2022-06-21 DIAGNOSIS — R55 Syncope and collapse: Secondary | ICD-10-CM | POA: Diagnosis not present

## 2022-06-21 DIAGNOSIS — S060X9A Concussion with loss of consciousness of unspecified duration, initial encounter: Secondary | ICD-10-CM | POA: Diagnosis not present

## 2022-06-21 DIAGNOSIS — S0993XA Unspecified injury of face, initial encounter: Secondary | ICD-10-CM | POA: Diagnosis not present

## 2022-06-21 DIAGNOSIS — R220 Localized swelling, mass and lump, head: Secondary | ICD-10-CM | POA: Diagnosis not present

## 2022-06-21 DIAGNOSIS — W19XXXA Unspecified fall, initial encounter: Secondary | ICD-10-CM

## 2022-06-21 LAB — COMPREHENSIVE METABOLIC PANEL
ALT: 13 U/L (ref 0–44)
AST: 24 U/L (ref 15–41)
Albumin: 3.9 g/dL (ref 3.5–5.0)
Alkaline Phosphatase: 27 U/L — ABNORMAL LOW (ref 38–126)
Anion gap: 12 (ref 5–15)
BUN: 13 mg/dL (ref 8–23)
CO2: 21 mmol/L — ABNORMAL LOW (ref 22–32)
Calcium: 9.2 mg/dL (ref 8.9–10.3)
Chloride: 102 mmol/L (ref 98–111)
Creatinine, Ser: 0.87 mg/dL (ref 0.44–1.00)
GFR, Estimated: >60 mL/min (ref >60)
Glucose, Bld: 111 mg/dL — ABNORMAL HIGH (ref 70–99)
Potassium: 3.4 mmol/L — ABNORMAL LOW (ref 3.5–5.1)
Sodium: 135 mmol/L (ref 135–145)
Total Bilirubin: 0.4 mg/dL (ref 0.3–1.2)
Total Protein: 6.9 g/dL (ref 6.5–8.1)

## 2022-06-21 LAB — URINALYSIS, ROUTINE W REFLEX MICROSCOPIC
Bilirubin Urine: NEGATIVE
Glucose, UA: NEGATIVE mg/dL
Hgb urine dipstick: NEGATIVE
Ketones, ur: NEGATIVE mg/dL
Leukocytes,Ua: NEGATIVE
Nitrite: NEGATIVE
Protein, ur: NEGATIVE mg/dL
Specific Gravity, Urine: >=1.03 (ref 1.005–1.030)
pH: 5.5 (ref 5.0–8.0)

## 2022-06-21 LAB — CBC WITH DIFFERENTIAL/PLATELET
Abs Immature Granulocytes: 0.01 10*3/uL (ref 0.00–0.07)
Basophils Absolute: 0 10*3/uL (ref 0.0–0.1)
Basophils Relative: 1 %
Eosinophils Absolute: 0 10*3/uL (ref 0.0–0.5)
Eosinophils Relative: 0 %
HCT: 37.2 % (ref 36.0–46.0)
Hemoglobin: 12.2 g/dL (ref 12.0–15.0)
Immature Granulocytes: 0 %
Lymphocytes Relative: 12 %
Lymphs Abs: 0.5 10*3/uL — ABNORMAL LOW (ref 0.7–4.0)
MCH: 32.9 pg (ref 26.0–34.0)
MCHC: 32.8 g/dL (ref 30.0–36.0)
MCV: 100.3 fL — ABNORMAL HIGH (ref 80.0–100.0)
Monocytes Absolute: 0.3 10*3/uL (ref 0.1–1.0)
Monocytes Relative: 7 %
Neutro Abs: 3.6 10*3/uL (ref 1.7–7.7)
Neutrophils Relative %: 80 %
Platelets: 193 10*3/uL (ref 150–400)
RBC: 3.71 MIL/uL — ABNORMAL LOW (ref 3.87–5.11)
RDW: 12 % (ref 11.5–15.5)
WBC: 4.5 10*3/uL (ref 4.0–10.5)
nRBC: 0 % (ref 0.0–0.2)

## 2022-06-21 MED ORDER — ACETAMINOPHEN 325 MG PO TABS
650.0000 mg | ORAL_TABLET | Freq: Once | ORAL | Status: AC
  Administered 2022-06-21: 650 mg via ORAL
  Filled 2022-06-21: qty 2

## 2022-06-21 MED ORDER — ONDANSETRON 4 MG PO TBDP
8.0000 mg | ORAL_TABLET | Freq: Once | ORAL | Status: AC
  Administered 2022-06-21: 8 mg via ORAL
  Filled 2022-06-21: qty 2

## 2022-06-21 NOTE — ED Triage Notes (Signed)
Pt got home from dinner, unwitnessed fall in bedroom  Syncopal episode, hit forehead and beside eye  Per EMS not obeying commands or responding to question on scene, became more alert during transport A&O x 4 on arrival to triage  C-collar placed by EMS  No blood thinners   Per husband pt has been having episodes of head pain that last approx 15/20 seconds and then resolves, had 3 episodes today

## 2022-06-21 NOTE — ED Provider Notes (Signed)
[facility] Provider Note: Casey Dell, MD, FACEP  CSN: [id number] MRN: [id number] ARRIVAL: 06/21/22 at 2029 ROOM: [facility]11/[facility]11   CHIEF COMPLAINT  Fall   HISTORY OF PRESENT ILLNESS  06/21/22 11:00 PM Casey Rowe is a 72 y.o. female who got home from dinner and had an unwitnessed fall in the bedroom, possibly due to a syncopal episode.  She does not remember the fall.  She hit her right forehead when she fell.  She was initially not following commands or responding to questions on scene but became more alert during transport to ED.  She was placed in c-collar by EMS prior to arrival.  On arrival she was awake, alert and oriented.  She is not on anticoagulation.  She has had had no nausea or vomiting.  She rates the pain associated with her injuries as a 6 out of 10.  She was given Tylenol in triage for this.   She has a history of mechanical falls in the past.  She also has a history, for several years, of episodes of nausea and confusion (not headache as noted in the triage note) that last 15 to 20 seconds and are followed by nausea.  They resolve on their own.  They may occur in clusters or she may have none for a long period of time.  She has had several in the past 2 days.   Past Medical History:  Diagnosis Date   Allergy    Anxiety    Asthma    Broken jaw (HCC) 1980s   Depression    Depression with anxiety    Hypertension    Preventative health care 03/16/2015   Sarcoidosis    Tubal pregnancy 1981    Past Surgical History:  Procedure Laterality Date   ABDOMINAL HYSTERECTOMY     CESAREAN SECTION     ECTOPIC PREGNANCY SURGERY  1981   FRACTURE SURGERY  1980s   broken jaw    Family History  Problem Relation Age of Onset   Hypertension Mother    Dementia Mother    Diabetes Mother    Diabetes Father    Heart attack Father    Heart disease Father    Psoriasis Father    Cancer Sister        pancreatic   Alcohol abuse Sister        substance    Hepatitis B Brother    Gout Brother    Hypertension Brother    Glaucoma Brother    Heart disease Brother    Rashes / Skin problems Son    Other Son        bad allergies, aspirin, dermatitis   Rashes / Skin problems Son    Cancer Maternal Aunt        breast   Breast cancer Maternal Aunt     Social History   Tobacco Use   Smoking status: Never   Smokeless tobacco: Never  Vaping Use   Vaping Use: Never used  Substance Use Topics   Alcohol use: Yes   Drug use: No    Prior to Admission medications   Medication Sig Start Date End Date Taking? Authorizing Provider  albuterol (VENTOLIN HFA) 108 (90 Base) MCG/ACT inhaler INHALE 2 PUFFS INTO THE LUNGS EVERY 6 HOURS AS NEEDED FOR WHEEZING 05/12/21   Luciano Cutter, MD  albuterol (VENTOLIN HFA) 108 (90 Base) MCG/ACT inhaler Inhale 1-2 puffs into the lungs every 4 (four) hours as needed for wheezing or shortness  of breath. 01/04/22   Luciano Cutter, MD  ALPRAZolam Prudy Feeler) 0.25 MG tablet Take 0.25 mg by mouth daily.     [provider]  azelastine (ASTELIN) 0.1 % nasal spray Place 2 sprays into both nostrils 2 (two) times daily. Use in each nostril as directed 03/16/16   Bradd Canary, MD  buPROPion (WELLBUTRIN XL) 300 MG 24 hr tablet Take 300 mg by mouth daily.    [provider]  busPIRone (BUSPAR) 5 MG tablet Take 5 mg by mouth 3 (three) times daily.     [provider]  cholestyramine (QUESTRAN) 4 g packet Take 1 packet (4 g total) by mouth 2 (two) times daily as needed. 06/08/22   Bradd Canary, MD  estradiol (ESTRACE) 1 MG tablet TAKE 1 TABLET BY MOUTH DAILY 01/29/22   [name], Casey Bastos, MD  fenofibrate micronized (LOFIBRA) 134 MG capsule TAKE ONE CAPSULE BY MOUTH EVERY MORNING BEFORE BREAKFAST 06/11/22   Bradd Canary, MD  fluticasone-salmeterol (ADVAIR DISKUS) 250-50 MCG/ACT AEPB Inhale 1 puff into the lungs in the morning and at bedtime. 01/04/22   Luciano Cutter, MD  losartan (COZAAR) 50 MG tablet  TAKE 1 TABLET BY MOUTH DAILY 03/10/22   Bradd Canary, MD  metoprolol succinate (TOPROL-XL) 50 MG 24 hr tablet TAKE 1 TABLET BY MOUTH DAILY WITH A MEAL 03/10/22   Bradd Canary, MD  neomycin-polymyxin-hydrocortisone (CORTISPORIN) 3.5-10000-1 ophthalmic suspension Place 3 drops into both eyes 3 (three) times daily. 02/16/22   Bradd Canary, MD  Peppermint Oil (IBGARD) 90 MG CPCR Take 2 capsules by mouth 2 (two) times daily as needed. 06/08/22   Bradd Canary, MD  progesterone (PROMETRIUM) 100 MG capsule TAKE ONE CAPSULE BY MOUTH DAILY 08/13/21   [name], DO  progesterone (PROMETRIUM) 100 MG capsule TAKE 1 CAPSULE BY MOUTH DAILY 01/29/22   [name], Casey Bastos, MD  saccharomyces boulardii (FLORASTOR) 250 MG capsule Take 250 mg by mouth daily.    [provider]    Allergies Demerol, Singulair [montelukast], and Augmentin [amoxicillin-pot clavulanate]   REVIEW OF SYSTEMS  Negative except as noted here or in the History of Present Illness.   PHYSICAL EXAMINATION  Initial Vital Signs Blood pressure 133/78, pulse 79, temperature 98.4 F (36.9 C), resp. rate 20, height 5\' 4"  (1.626 m), weight 52.2 kg, last menstrual period 02/08/1997, SpO2 98 %.  Examination General: Well-developed, well-nourished female in no acute distress; appearance consistent with age of record HENT: normocephalic; right forehead and right zygomatic hematomas with small abrasion:    Eyes: pupils equal, round and reactive to light; extraocular muscles intact Neck: supple mild left posterior soft tissue tenderness Heart: regular rate and rhythm; no murmurs, rubs or gallops Lungs: clear to auscultation bilaterally Abdomen: soft; nondistended; nontender; bowel sounds present Extremities: No deformity; full range of motion; pulses normal Neurologic: Awake, alert and oriented to person, place and year but not month or day; motor function intact in all extremities and symmetric; no facial droop Skin: Warm  and dry Psychiatric: Flat affect   RESULTS  Summary of this visit's results, reviewed and interpreted by myself:   EKG Interpretation  Date/Time:    Ventricular Rate:    PR Interval:    QRS Duration:   QT Interval:    QTC Calculation:   R Axis:     Text Interpretation:         Laboratory Studies: Results for orders placed or performed during the hospital encounter of  06/21/22 (from the past 24 hour(s))  CBC with Differential     Status: Abnormal   Collection Time: 06/21/22  8:44 PM  Result Value Ref Range   WBC 4.5 4.0 - 10.5 K/uL   RBC 3.71 (L) 3.87 - 5.11 MIL/uL   Hemoglobin 12.2 12.0 - 15.0 g/dL   HCT 16.1 09.6 - 04.5 %   MCV 100.3 (H) 80.0 - 100.0 fL   MCH 32.9 26.0 - 34.0 pg   MCHC 32.8 30.0 - 36.0 g/dL   RDW 40.9 81.1 - 91.4 %   Platelets 193 150 - 400 K/uL   nRBC 0.0 0.0 - 0.2 %   Neutrophils Relative % 80 %   Neutro Abs 3.6 1.7 - 7.7 K/uL   Lymphocytes Relative 12 %   Lymphs Abs 0.5 (L) 0.7 - 4.0 K/uL   Monocytes Relative 7 %   Monocytes Absolute 0.3 0.1 - 1.0 K/uL   Eosinophils Relative 0 %   Eosinophils Absolute 0.0 0.0 - 0.5 K/uL   Basophils Relative 1 %   Basophils Absolute 0.0 0.0 - 0.1 K/uL   Immature Granulocytes 0 %   Abs Immature Granulocytes 0.01 0.00 - 0.07 K/uL  Comprehensive metabolic panel     Status: Abnormal   Collection Time: 06/21/22  8:44 PM  Result Value Ref Range   Sodium 135 135 - 145 mmol/L   Potassium 3.4 (L) 3.5 - 5.1 mmol/L   Chloride 102 98 - 111 mmol/L   CO2 21 (L) 22 - 32 mmol/L   Glucose, Bld 111 (H) 70 - 99 mg/dL   BUN 13 8 - 23 mg/dL   Creatinine, Ser 7.82 0.44 - 1.00 mg/dL   Calcium 9.2 8.9 - 95.6 mg/dL   Total Protein 6.9 6.5 - 8.1 g/dL   Albumin 3.9 3.5 - 5.0 g/dL   AST 24 15 - 41 U/L   ALT 13 0 - 44 U/L   Alkaline Phosphatase 27 (L) 38 - 126 U/L   Total Bilirubin 0.4 0.3 - 1.2 mg/dL   GFR, Estimated >21 >30 mL/min   Anion gap 12 5 - 15  Urinalysis, Routine w reflex microscopic -Urine, Clean Catch      Status: Abnormal   Collection Time: 06/21/22 11:15 PM  Result Value Ref Range   Color, Urine YELLOW YELLOW   APPearance CLOUDY (A) CLEAR   Specific Gravity, Urine >=1.030 1.005 - 1.030   pH 5.5 5.0 - 8.0   Glucose, UA NEGATIVE NEGATIVE mg/dL   Hgb urine dipstick NEGATIVE NEGATIVE   Bilirubin Urine NEGATIVE NEGATIVE   Ketones, ur NEGATIVE NEGATIVE mg/dL   Protein, ur NEGATIVE NEGATIVE mg/dL   Nitrite NEGATIVE NEGATIVE   Leukocytes,Ua NEGATIVE NEGATIVE   Imaging Studies: CT Head Wo Contrast  Result Date: 06/21/2022 CLINICAL DATA:  Head trauma. Unwitnessed fall in bathroom. Syncopal episode. Hit forehead and beside I. EXAM: CT HEAD WITHOUT CONTRAST CT MAXILLOFACIAL WITHOUT CONTRAST TECHNIQUE: Multidetector CT imaging of the head and maxillofacial structures were performed using the standard protocol without intravenous contrast. Multiplanar CT image reconstructions of the maxillofacial structures were also generated. RADIATION DOSE REDUCTION: This exam was performed according to the departmental dose-optimization program which includes automated exposure control, adjustment of the mA and/or kV according to patient size and/or use of iterative reconstruction technique. COMPARISON:  None Available. FINDINGS: CT HEAD FINDINGS Brain: There is mild-to-moderate cortical atrophy, within normal limits for patient age. The ventricles are normal in configuration. The basilar cisterns are patent. No mass, mass effect, or  midline shift. No acute intracranial hemorrhage is seen. No abnormal extra-axial fluid collection. Mild scattered hypodensities within the periventricular white matter, compatible with mild chronic ischemic white matter changes. Otherwise, preservation of the normal cortical gray-white interface without CT evidence of an acute major vascular territorial cortical based infarction. Vascular: No hyperdense vessel or unexpected calcification. Skull: Normal. Negative for fracture or focal lesion.  Sinuses/Orbits: The visualized orbits are unremarkable. There is high-grade opacification of the frontal sinuses. Moderate anterior greater than posterior ethmoid air cell mucosal opacification. Mild-to-moderate left and mild right axillary sinus dependent opacification with some bubbly lucencies within the left-sided fluid. There is a connection between the right maxillary sinus and the right middle meatus, possibly postsurgical. Incidental note of a left Haller air cell (coronal series 4, image 52). Other: There is a soft tissue hematoma within the right forehead measuring up to approximately 3.7 x 1.4 x 2.6 cm (transverse by AP by craniocaudal). -- CT MAXILLOFACIAL FINDINGS Osseous: The nasal bone is intact. The bilateral zygomatic arches are intact. The bilateral temporomandibular joints are intact and the mandible is intact. The pterygoid plates are intact. Orbits: The superior, inferior, medial, lateral aspect of each bony orbit is intact. The ocular globes are intact. The bilateral extraocular muscles are intact. Sinuses: There is complete opacification of the right frontal sinus with high-grade opacification of the left frontal sinus and the bilateral frontal ethmoid sinuses. There is moderate mucosal opacification of the ethmoid air cells. Mild-to-moderate left and mild right maxillary sinus mucosal thickening with mild left maxillary sinus layering fluid level with mild internal bubbly lucencies. The bilateral lamina papyracea appear intact. There is a chronic appearing approximate 5 mm AP dimension defect within the right lamina papyracea connecting the right maxillary sinus and the right middle meatus. Soft tissues: There is mild-to-moderate hematoma within the right forehead soft tissues with some 10 acute blood density. IMPRESSION: CT HEAD: 1. No acute intracranial abnormality. 2. Mild-to-moderate cortical atrophy, within normal limits for patient age. 3. Mild chronic ischemic white matter changes. CT  MAXILLOFACIAL: 1. No acute facial bone fracture. 2. Moderate paranasal sinus opacification as above. 3. Right forehead soft tissue hematoma. Electronically Signed   By: Neita Garnet M.D.   On: 06/21/2022 21:46   CT MAXILLOFACIAL WO CONTRAST  Result Date: 06/21/2022 CLINICAL DATA:  Fall, hit forehead EXAM: CT MAXILLOFACIAL WITHOUT CONTRAST TECHNIQUE: Multidetector CT imaging of the maxillofacial structures was performed. Multiplanar CT image reconstructions were also generated. RADIATION DOSE REDUCTION: This exam was performed according to the departmental dose-optimization program which includes automated exposure control, adjustment of the mA and/or kV according to patient size and/or use of iterative reconstruction technique. COMPARISON:  None Available. FINDINGS: Osseous: No fracture or mandibular dislocation. No destructive process. Orbits: Negative. No traumatic or inflammatory finding. Sinuses: Mucosal thickening diffusely throughout the paranasal sinuses. Air-fluid levels in the sphenoid sinus and right maxillary sinus most compatible with acute on chronic sinusitis. Soft tissues: Soft tissue swelling over the right forehead and orbit. Limited intracranial: See head CT report IMPRESSION: No facial or orbital fracture. Acute on chronic pansinusitis. Electronically Signed   By: Charlett Nose M.D.   On: 06/21/2022 21:31   CT Cervical Spine Wo Contrast  Result Date: 06/21/2022 CLINICAL DATA:  Neck trauma, dangerous injury mechanism (Age 19-64y). Fall. She EXAM: CT CERVICAL SPINE WITHOUT CONTRAST TECHNIQUE: Multidetector CT imaging of the cervical spine was performed without intravenous contrast. Multiplanar CT image reconstructions were also generated. RADIATION DOSE REDUCTION: This exam was performed according  to the departmental dose-optimization program which includes automated exposure control, adjustment of the mA and/or kV according to patient size and/or use of iterative reconstruction technique.  COMPARISON:  None Available. FINDINGS: Alignment: Normal Skull base and vertebrae: No acute fracture. No primary bone lesion or focal pathologic process. Soft tissues and spinal canal: No prevertebral fluid or swelling. No visible canal hematoma. Disc levels: Degenerative disc disease most pronounced at C5-6 and C6-7. Moderate to advanced degenerative facet disease, right greater than left. Upper chest: No acute findings Other: None IMPRESSION: Degenerative disc and facet disease.  No acute bony abnormality. Electronically Signed   By: Charlett Nose M.D.   On: 06/21/2022 21:29    ED COURSE and MDM  Nursing notes, initial and subsequent vitals signs, including pulse oximetry, reviewed and interpreted by myself.  Vitals:   06/21/22 2039 06/21/22 2200 06/21/22 2315 06/21/22 2330  BP: 133/78 135/76 (!) 157/76 (!) 144/77  Pulse: 79 71 74   Resp: 20  17 15   Temp: 98.4 F (36.9 C)     SpO2: 98% 99% 100%   Weight:      Height:       Medications  acetaminophen (TYLENOL) tablet 650 mg (650 mg Oral Given 06/21/22 2146)  ondansetron (ZOFRAN-ODT) disintegrating tablet 8 mg (8 mg Oral Given 06/21/22 2340)   12:37 AM The patient has been observed in the ED for 4 hours without any change in mental status or neurologic exam.  It is unclear if she had a syncopal episode or had a mechanical fall.  Her rhythm strip was reviewed for her time in the ED and she has had no ectopy or arrhythmia.  Her urinalysis suggests a degree of dehydration and she was advised to keep well-hydrated at home.  Her husband favors the mechanical fall as she has fallen occasionally in the past, usually due to tripping or pain in her feet.  She has no bite marks on her tongue or incontinence to suggest a seizure and has no history of seizures.  She is disoriented to month and day but her husband states that this is typical for her.  He is comfortable taking her home and observing her.  He was given instructions on things to look for such as  seizure, inability to awaken, staggering gait, persistent vomiting, and anisocoria.  She she was advised to use an ice pack on her hematoma and to expect gravity to pull this down resulting in a periorbital hematoma.   PROCEDURES  Procedures   ED DIAGNOSES     ICD-10-CM   1. Fall in home, initial encounter  W19.XXXA    Y92.009     2. Facial hematoma, initial encounter  S00.83XA     3. Concussion with brief loss of consciousness  S06.0X1A     4. Abrasion of cheek, initial encounter  S00.81XA          Skye Plamondon, Jonny Ruiz, MD 06/22/22 [phone]

## 2022-06-21 NOTE — ED Notes (Signed)
C-Collar removed per McBride, PA

## 2022-06-22 ENCOUNTER — Encounter: Payer: Self-pay | Admitting: Family Medicine

## 2022-06-23 ENCOUNTER — Ambulatory Visit (INDEPENDENT_AMBULATORY_CARE_PROVIDER_SITE_OTHER): Payer: Medicare PPO | Admitting: Family

## 2022-06-23 VITALS — BP 131/57 | HR 58 | Temp 97.7°F | Resp 16 | Wt 115.6 lb

## 2022-06-23 DIAGNOSIS — S0011XA Contusion of right eyelid and periocular area, initial encounter: Secondary | ICD-10-CM | POA: Diagnosis not present

## 2022-06-23 DIAGNOSIS — T148XXA Other injury of unspecified body region, initial encounter: Secondary | ICD-10-CM

## 2022-06-23 DIAGNOSIS — R413 Other amnesia: Secondary | ICD-10-CM

## 2022-06-23 DIAGNOSIS — E538 Deficiency of other specified B group vitamins: Secondary | ICD-10-CM | POA: Diagnosis not present

## 2022-06-23 DIAGNOSIS — S0083XA Contusion of other part of head, initial encounter: Secondary | ICD-10-CM

## 2022-06-23 DIAGNOSIS — R630 Anorexia: Secondary | ICD-10-CM | POA: Diagnosis not present

## 2022-06-23 DIAGNOSIS — Y92009 Unspecified place in unspecified non-institutional (private) residence as the place of occurrence of the external cause: Secondary | ICD-10-CM

## 2022-06-23 DIAGNOSIS — W19XXXA Unspecified fall, initial encounter: Secondary | ICD-10-CM | POA: Diagnosis not present

## 2022-06-23 DIAGNOSIS — E876 Hypokalemia: Secondary | ICD-10-CM | POA: Diagnosis not present

## 2022-06-23 HISTORY — DX: Deficiency of other specified B group vitamins: E53.8

## 2022-06-23 HISTORY — DX: Other injury of unspecified body region, initial encounter: T14.8XXA

## 2022-06-23 LAB — BASIC METABOLIC PANEL
BUN: 10 mg/dL (ref 6–23)
CO2: 29 mEq/L (ref 19–32)
Calcium: 9.5 mg/dL (ref 8.4–10.5)
Chloride: 105 mEq/L (ref 96–112)
Creatinine, Ser: 0.92 mg/dL (ref 0.40–1.20)
GFR: 63.4 mL/min (ref >60.00)
Glucose, Bld: 81 mg/dL (ref 70–99)
Potassium: 4 mEq/L (ref 3.5–5.1)
Sodium: 141 mEq/L (ref 135–145)

## 2022-06-23 LAB — VITAMIN B12: Vitamin B-12: 588 pg/mL (ref 211–911)

## 2022-06-23 NOTE — Assessment & Plan Note (Signed)
I suspect that she also likely sustained a concussion.  Hematoma is improving since ED visit.

## 2022-06-23 NOTE — Assessment & Plan Note (Signed)
Shuffling gait- does not use a cane.  I did recommend that she use a cane to decrease her risk of fall. Also recommend PT for balance and strength training.  She and husband decline as she works out regularly at [employer].

## 2022-06-23 NOTE — Progress Notes (Signed)
Subjective:     Patient ID: Casey Rowe, female    DOB: April 26, 1950, 72 y.o.   MRN: [id number]  Chief Complaint  Patient presents with   Follow-up    Here for ED follow up   Fall    Here for follow up after fall at home    Fall   Patient is in today for ED follow up.  Pt presented to ED on 5/13 secondary to an unwitnessed mechanical fall. She sustained a hematoma on her forehead. No fractures noted on CT Cspine/maxillofacial.  UA was unremarkable. Potassium was slightly low.  (3.4).  H/H wnl.   Patient states that she cannot remember the incident.    Husband reports anorexia for 3-4 years. May eat breakfast. In the afternoon she doesn't remember if she ate. Often forgets to take her medication.  Weight in 202 was 137 lb.   Wt Readings from Last 3 Encounters:  06/23/22 115 lb 9.6 oz (52.4 kg)  06/21/22 115 lb (52.2 kg)  03/29/22 117 lb (53.1 kg)   She sees psychiatry.    Health Maintenance Due  Topic Date Due   Medicare Annual Wellness (AWV)  Never done   Diabetic kidney evaluation - Urine ACR  Never done    Past Medical History:  Diagnosis Date   Allergy    Anxiety    Asthma    Broken jaw (HCC) 1980s   Depression    Depression with anxiety    Hypertension    Preventative health care 03/16/2015   Sarcoidosis    Tubal pregnancy 1981    Past Surgical History:  Procedure Laterality Date   ABDOMINAL HYSTERECTOMY     CESAREAN SECTION     ECTOPIC PREGNANCY SURGERY  1981   FRACTURE SURGERY  1980s   broken jaw    Family History  Problem Relation Age of Onset   Hypertension Mother    Dementia Mother    Diabetes Mother    Diabetes Father    Heart attack Father    Heart disease Father    Psoriasis Father    Cancer Sister        pancreatic   Alcohol abuse Sister        substance   Hepatitis B Brother    Gout Brother    Hypertension Brother    Glaucoma Brother    Heart disease Brother    Rashes / Skin problems Son    Other Son        bad allergies,  aspirin, dermatitis   Rashes / Skin problems Son    Cancer Maternal Aunt        breast   Breast cancer Maternal Aunt     Social History   Socioeconomic History   Marital status: Married    Spouse name: Not on file   Number of children: Not on file   Years of education: Not on file   Highest education level: Not on file  Occupational History   Not on file  Tobacco Use   Smoking status: Never   Smokeless tobacco: Never  Vaping Use   Vaping Use: Never used  Substance and Sexual Activity   Alcohol use: Yes   Drug use: No   Sexual activity: Yes    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[employer], lives with husband, no dietary, minimal red meat  Other Topics Concern   Not on file  Social History Narrative   Works as a Runner, 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  Married   Grown children- 3         Social Determinants of Corporate investment banker Strain: Not on file  Food Insecurity: Not on file  Transportation Needs: Not on file  Physical Activity: Not on file  Stress: Not on file  Social Connections: Not on file  Intimate Partner Violence: Not on file    Outpatient Medications Prior to Visit  Medication Sig Dispense Refill   albuterol (VENTOLIN HFA) 108 (90 Base) MCG/ACT inhaler INHALE 2 PUFFS INTO THE LUNGS EVERY 6 HOURS AS NEEDED FOR WHEEZING 18 g 5   albuterol (VENTOLIN HFA) 108 (90 Base) MCG/ACT inhaler Inhale 1-2 puffs into the lungs every 4 (four) hours as needed for wheezing or shortness of breath. 1 each 5   ALPRAZolam (XANAX) 0.25 MG tablet Take 0.25 mg by mouth daily.      azelastine (ASTELIN) 0.1 % nasal spray Place 2 sprays into both nostrils 2 (two) times daily. Use in each nostril as directed 30 mL 12   buPROPion (WELLBUTRIN XL) 300 MG 24 hr tablet Take 300 mg by mouth daily.     busPIRone (BUSPAR) 5 MG tablet Take 5 mg by mouth 3 (three) times daily.      cholestyramine (QUESTRAN) 4 g packet Take 1 packet (4 g total) by mouth 2 (two) times daily as needed. 60 each 3   estradiol  (ESTRACE) 1 MG tablet TAKE 1 TABLET BY MOUTH DAILY 90 tablet 1   fenofibrate micronized (LOFIBRA) 134 MG capsule TAKE ONE CAPSULE BY MOUTH EVERY MORNING BEFORE BREAKFAST 90 capsule 0   fluticasone-salmeterol (ADVAIR DISKUS) 250-50 MCG/ACT AEPB Inhale 1 puff into the lungs in the morning and at bedtime. 60 each 5   losartan (COZAAR) 50 MG tablet TAKE 1 TABLET BY MOUTH DAILY 90 tablet 1   metoprolol succinate (TOPROL-XL) 50 MG 24 hr tablet TAKE 1 TABLET BY MOUTH DAILY WITH A MEAL 90 tablet 1   neomycin-polymyxin-hydrocortisone (CORTISPORIN) 3.5-10000-1 ophthalmic suspension Place 3 drops into both eyes 3 (three) times daily. 7.5 mL 0   Peppermint Oil (IBGARD) 90 MG CPCR Take 2 capsules by mouth 2 (two) times daily as needed. 120 capsule 5   progesterone (PROMETRIUM) 100 MG capsule TAKE ONE CAPSULE BY MOUTH DAILY 90 capsule 1   progesterone (PROMETRIUM) 100 MG capsule TAKE 1 CAPSULE BY MOUTH DAILY 90 capsule 1   saccharomyces boulardii (FLORASTOR) 250 MG capsule Take 250 mg by mouth daily.     No facility-administered medications prior to visit.    Allergies  Allergen Reactions   Demerol Nausea And Vomiting   Singulair [Montelukast]     Dry mouth/dehydration   Augmentin [Amoxicillin-Pot Clavulanate] Diarrhea    Caused Diarrhea    ROS See HPI    Objective:    Physical Exam Constitutional:      General: She is not in acute distress.    Appearance: Normal appearance. She is well-developed.  HENT:     Head: Normocephalic and atraumatic.     Right Ear: External ear normal.     Left Ear: External ear normal.  Eyes:     General: No scleral icterus.    Comments: Bruising noted right forehead and around right eye.   Neck:     Thyroid: No thyromegaly.  Cardiovascular:     Rate and Rhythm: Normal rate and regular rhythm.     Heart sounds: Normal heart sounds. No murmur heard. Pulmonary:     Effort: Pulmonary effort is normal. No  respiratory distress.     Breath sounds: Normal breath  sounds. No wheezing.  Musculoskeletal:     Cervical back: Neck supple.  Skin:    General: Skin is warm and dry.  Neurological:     Mental Status: She is alert.     Comments: Has poor recall about recent events  Wide shuffling slow gait. She seems to drag the left toe a bit which causes her left foot to stumble a bit.   Psychiatric:        Mood and Affect: Mood normal.        Behavior: Behavior normal.        Thought Content: Thought content normal.        Judgment: Judgment normal.     BP (!) 131/57 (BP Location: Right Arm, Patient Position: Sitting, Cuff Size: Small)   Pulse (!) 58   Temp 97.7 F (36.5 C) (Oral)   Resp 16   Wt 115 lb 9.6 oz (52.4 kg)   LMP 02/08/1997   SpO2 100%   BMI 19.84 kg/m  Wt Readings from Last 3 Encounters:  06/23/22 115 lb 9.6 oz (52.4 kg)  06/21/22 115 lb (52.2 kg)  03/29/22 117 lb (53.1 kg)       Assessment & Plan:   Problem List Items Addressed This Visit       Unprioritized   Vitamin B 12 deficiency   Relevant Orders   B12   Memory loss - Primary    Husband stated that until her fall her memory had been poor for the last 30 years.  However he also said that even before her fall, she can't remember in the afternoon if she ate in the AM and she rarely remembers to take her medicine I am concerned about underlying dementia. She also has a slow, shuffling gait which raises the concern for Parkinsons.  She does not have an obvious tremor. Will refer to neurology for further evaluation.       Relevant Orders   Ambulatory referral to Neurology   Hematoma    I suspect that she also likely sustained a concussion.  Hematoma is improving since ED visit.       Fall at home    Shuffling gait- does not use a cane.  I did recommend that she use a cane to decrease her risk of fall. Also recommend PT for balance and strength training.  She and husband decline as she works out regularly at [employer].      Anorexia    Husband is concerned about  pt's weight loss and lack of appetite.  I noted that she is on Wellbutrin xl 300mg  once daily which is being prescribed by her psychiatrist. I recommended that she follow up with psychiatry to see if they can change her antidepressant.  She had a TSH recently which was WNL.        Other Visit Diagnoses     Hypokalemia       Relevant Orders   Basic Metabolic Panel (BMET)       I am having Sherol L. Theurer maintain her ALPRAZolam, busPIRone, buPROPion, azelastine, saccharomyces boulardii, albuterol, progesterone, fluticasone-salmeterol, albuterol, estradiol, progesterone, neomycin-polymyxin-hydrocortisone, losartan, metoprolol succinate, IBgard, cholestyramine, and fenofibrate micronized.  No orders of the defined types were placed in this encounter.

## 2022-06-23 NOTE — Assessment & Plan Note (Signed)
Husband stated that until her fall her memory had been poor for the last 30 years.  However he also said that even before her fall, she can't remember in the afternoon if she ate in the AM and she rarely remembers to take her medicine I am concerned about underlying dementia. She also has a slow, shuffling gait which raises the concern for Parkinsons.  She does not have an obvious tremor. Will refer to neurology for further evaluation.

## 2022-06-23 NOTE — Assessment & Plan Note (Signed)
Husband is concerned about pt's weight loss and lack of appetite.  I noted that she is on Wellbutrin xl 300mg  once daily which is being prescribed by her psychiatrist. I recommended that she follow up with psychiatry to see if they can change her antidepressant.  She had a TSH recently which was WNL.

## 2022-06-24 ENCOUNTER — Encounter: Payer: Self-pay | Admitting: Physician Assistant

## 2022-06-29 DIAGNOSIS — F411 Generalized anxiety disorder: Secondary | ICD-10-CM | POA: Diagnosis not present

## 2022-06-29 DIAGNOSIS — F331 Major depressive disorder, recurrent, moderate: Secondary | ICD-10-CM | POA: Diagnosis not present

## 2022-07-05 ENCOUNTER — Encounter: Payer: Self-pay | Admitting: Family Medicine

## 2022-07-06 NOTE — Telephone Encounter (Signed)
Called pt lvm needing to get her set up with f/u appt with one of our providers  ER follow up. Pt has neurology appt 07/16/22.

## 2022-07-07 NOTE — Telephone Encounter (Signed)
Pt had a follow up appt with [facility] 06/22/22, Pt stated she has appt with nero just letting Dr.Blyth know about everything.

## 2022-07-16 ENCOUNTER — Ambulatory Visit: Payer: Medicare PPO

## 2022-07-16 ENCOUNTER — Encounter: Payer: Self-pay | Admitting: Physician Assistant

## 2022-07-16 ENCOUNTER — Ambulatory Visit: Payer: Medicare PPO | Admitting: Physician Assistant

## 2022-07-16 VITALS — BP 119/64 | HR 64 | Resp 18 | Ht 64.0 in | Wt 114.0 lb

## 2022-07-16 DIAGNOSIS — R404 Transient alteration of awareness: Secondary | ICD-10-CM

## 2022-07-16 DIAGNOSIS — R413 Other amnesia: Secondary | ICD-10-CM

## 2022-07-16 NOTE — Patient Instructions (Signed)

## 2022-07-16 NOTE — Progress Notes (Signed)
Assessment/Plan:   Casey Rowe is a delightful  72 y.o. year old RH female with a history of hypertension, hyperglycemia, anxiety, depression,  seen today for evaluation of memory loss. Recent CT of the head taken after a fall with transient alteration of awareness which required the ED presentation for further workup with no apparent cardiac etiology was remarkable for mild to moderate cortical atrophy but within normal limits for patient age, as well as mild chronic ischemic white matter changes.  No acute intracranial abnormalities were noted. MoCA today is 24/30.  Patient is able to participate on his IADLs and continues to drive.  Workup is in progress.  There was an instance during this visit, in which the patient did not remember having asked the same question to the provider ("You did not ask me if I read books "-she said, although the provider did.)  Patient was apprehensive about being here, at times defensive in her answers.  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  Will consider starting antidementia medication such as donepezil, but the patient does have a history of chronic diarrhea that is not resolving.  If this is resolved, we would consider starting ACHI.  She has family history of Alzheimer's dementia.  In addition, there is a question of possible seizures prior to the presentation to the ER, without recurrence, etiology unknown. .  Memory Impairment of unclear etiology  MRI brain without contrast to assess for underlying structural abnormality and assess vascular load  Neurocognitive testing to further evaluate cognitive concerns and determine other underlying cause of memory changes, including potential contribution from sleep, anxiety, or depression  Check EEG for further evaluation of transient alteration of awareness on May 2024.  Recommend no driving for 6 months as per [place] laws  requirement. Check  B1   continue to control mood as per PCP Recommend good control of cardiovascular risk factors [place] up in 3 months  Subjective:   The patient is accompanied by her husband who supplements the history.   How long did patient have memory difficulties?  Patient denies that she has any memory issues.  She states that she is a Runner, broadcasting/film/video, is smart, and "everybody has some moments but that does not mean that I have memory problems ".  Her husband does report that she had some memory issues for the last 1-1/2 years, especially about appointments.  She does repeat herself sometimes.  Patient disagrees with her husband regarding this issue.  She states that she likes to read extensively, is fairly active.  She states that she continues to sub repeats oneself?  Endorsed Disoriented when walking into a room?  Patient denies  Leaving objects in unusual places? denies   Wandering behavior?  denies   Any personality changes?  She adamantly says no.  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 "Everybody is human" Any history of depression?: Endorsed for the last 4 y Hallucinations or paranoia?  Patient denies   Seizures?   Patient denies, however, she sustained a fall recently as May 2024  for which she was seen in the ER.  It is unclear if she had a syncopal episode, although she was cleared for cardiac issues.  She is not sure if she had a seizure though there were no bite marks on her tongue,  but her husband states that she did have some shaking when he looked at her, no foaming in the mouth.She does not remember having fallen while in the bathroom.  No stitches were received, but she did have a significant bruise in the right above the brow area.  Any sleep changes? Sleeps well. Denies vivid dreams, REM behavior or sleepwalking   Sleep apnea?  Patient denies   Any hygiene concerns?  Patient denies    Independent of bathing and dressing?  Endorsed  Does the patient needs help with medications? Patient is in charge  "She forgets id she took them or not" Who is in charge of the finances? Patient is in charge, she is on auto-pay     Any changes in appetite?  Decreased. "Lost my ability to be hungry". She reports drinking water.   "She needs to be reminded though " Patient have trouble swallowing? denies   Does the patient cook?  Yes  Any kitchen accidents such as leaving the stove on? denies   Any headaches?  Denies  Chronic back pain ? denies   Ambulates with difficulty?  denies . [facility] [employer] , exercises frequently.  Recent falls or head injuries?  As mentioned above, she sustained an unwitnessed head trauma while in the bathroom, she hit her forehead and sustained a large hematoma on the right side CT of the head and maxillofacial was negative for acute intracranial abnormalities, or bone fractures.   Vision changes? denies   Unilateral weakness, numbness or tingling? denies  History of neuropathy followed in [facility].  She states that her neuropathy may cause some of her hesitancy when walking. Any tremors?   denies   Any anosmia? "Maybe, it is kind of weird for the last 2 years, loosens up with diet and losing the sense of smell" Any incontinence of urine? denies   Any bowel dysfunction? Chronic diarrhea for the last year. She takes IB guard daily and Imodium.  She is to follow-up with GI at some point Patient lives with her husband History of heavy alcohol intake? Drinks wine, 5-6 glasses a week  History of heavy tobacco use? denies   Family history of dementia?  Mother had Alzheimer's dementia.  Does patient drive?  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, now substituting   Past Medical History:  Diagnosis Date   Allergy    Anxiety    Asthma    Broken jaw (HCC) 1980s   Depression    Depression with anxiety    Hypertension     Preventative health care 03/16/2015   Sarcoidosis    Tubal pregnancy 1981     Past Surgical History:  Procedure Laterality Date   ABDOMINAL HYSTERECTOMY     CESAREAN SECTION     ECTOPIC PREGNANCY SURGERY  1981   FRACTURE SURGERY  1980s   broken jaw     Allergies  Allergen Reactions   Demerol Nausea And Vomiting   Singulair [Montelukast]     Dry mouth/dehydration   Augmentin [Amoxicillin-Pot Clavulanate] Diarrhea    Caused Diarrhea    Current Outpatient Medications  Medication Instructions   albuterol (VENTOLIN HFA) 108 (90 Base) MCG/ACT inhaler INHALE 2 PUFFS INTO THE LUNGS EVERY 6 HOURS AS NEEDED FOR WHEEZING   albuterol (VENTOLIN HFA) 108 (90 Base) MCG/ACT inhaler 1-2 puffs, Inhalation, Every 4 hours PRN   ALPRAZolam (XANAX) 0.25 mg, Oral, Daily   azelastine (ASTELIN) 0.1 %  nasal spray 2 sprays, Each Nare, 2 times daily, Use in each nostril as directed   buPROPion (WELLBUTRIN XL) 300 mg, Oral, Daily   busPIRone (BUSPAR) 5 mg, Oral, 3 times daily   cholestyramine (QUESTRAN) 4 g, Oral, 2 times daily PRN   estradiol (ESTRACE) 1 mg, Oral, Daily   fenofibrate micronized (LOFIBRA) 134 MG capsule TAKE ONE CAPSULE BY MOUTH EVERY MORNING BEFORE BREAKFAST   fluticasone-salmeterol (ADVAIR DISKUS) 250-50 MCG/ACT AEPB 1 puff, Inhalation, 2 times daily   losartan (COZAAR) 50 mg, Oral, Daily   metoprolol succinate (TOPROL-XL) 50 MG 24 hr tablet TAKE 1 TABLET BY MOUTH DAILY WITH A MEAL   neomycin-polymyxin-hydrocortisone (CORTISPORIN) 3.5-10000-1 ophthalmic suspension 3 drops, Both Eyes, 3 times daily   Peppermint Oil (IBGARD) 90 MG CPCR 2 capsules, Oral, 2 times daily PRN   progesterone (PROMETRIUM) 100 MG capsule TAKE ONE CAPSULE BY MOUTH DAILY   progesterone (PROMETRIUM) 100 mg, Oral, Daily   saccharomyces boulardii (FLORASTOR) 250 mg, Oral, Daily     VITALS:   Vitals:   07/16/22 0927  BP: 119/64  Pulse: 64  Resp: 18  SpO2: 98%  Weight: 114 lb (51.7 kg)  Height: 5\' 4"  (1.626  m)       PHYSICAL EXAM   HEENT:  Normocephalic, atraumatic. The mucous membranes are moist. The superficial temporal arteries are without ropiness or tenderness. Cardiovascular: Regular rate and rhythm. Lungs: Clear to auscultation bilaterally. Neck: There are no carotid bruits noted bilaterally.  NEUROLOGICAL:    07/16/2022   12:00 PM  Montreal Cognitive Assessment   Visuospatial/ Executive (0/5) 4  Naming (0/3) 3  Attention: Read list of digits (0/2) 2  Attention: Read list of letters (0/1) 1  Attention: Serial 7 subtraction starting at 100 (0/3) 3  Language: Repeat phrase (0/2) 2  Language : Fluency (0/1) 0  Abstraction (0/2) 2  Delayed Recall (0/5) 1  Orientation (0/6) 6  Total 24  Adjusted Score (based on education) 24        No data to display           Orientation:  Alert and oriented to person, place and time. No aphasia or dysarthria. Fund of knowledge is appropriate. Recent memory impaired and remote memory intact.  Attention and concentration are normal.  Able to name objects and repeat phrases. Delayed recall  1/5 Cranial nerves: There is good facial symmetry. Extraocular muscles are intact and visual fields are full to confrontational testing. Speech is fluent and clear.  Hearing is intact to conversational tone. Tone: Tone is good throughout. Sensation: Sensation is intact to light touch and pinprick throughout. Vibration is intact at the bilateral big toe.  Coordination: The patient has no difficulty with RAM's or FNF bilaterally. Normal finger to nose  Motor: Strength is 5/5 in the bilateral upper and lower extremities. There is no pronator drift. There are no fasciculations noted. DTR's: Deep tendon reflexes are 2/4 at the bilateral biceps, triceps, brachioradialis, patella and achilles.  Plantar responses are downgoing bilaterally. Gait and Station: The patient is able to ambulate without difficulty.The patient is able to heel toe walk without any  difficulty. Gait is cautious and narrow. The patient is able to ambulate in a tandem fashion. The patient is able to stand in the Romberg position.     Thank you for allowing [place] the opportunity to participate in the care of this nice patient. Please do not hesitate to contact us for any questions or concerns.   Total time spent  on today's visit was 69 minutes dedicated to this patient today, preparing to see patient, examining the patient, ordering tests and/or medications and counseling the patient, documenting clinical information in the EHR or other health record, independently interpreting results and communicating results to the patient/family, discussing treatment and goals, answering patient's questions and coordinating care.  Cc:  Bradd Canary, MD  Marlowe Kays 07/16/2022 12:59 PM

## 2022-07-19 ENCOUNTER — Ambulatory Visit: Payer: Medicare PPO | Admitting: Neurology

## 2022-07-19 DIAGNOSIS — R404 Transient alteration of awareness: Secondary | ICD-10-CM | POA: Diagnosis not present

## 2022-07-19 NOTE — Progress Notes (Signed)
EEG complete - results pending 

## 2022-07-21 ENCOUNTER — Other Ambulatory Visit: Payer: Self-pay | Admitting: Family Medicine

## 2022-07-27 DIAGNOSIS — F331 Major depressive disorder, recurrent, moderate: Secondary | ICD-10-CM | POA: Diagnosis not present

## 2022-07-27 DIAGNOSIS — F411 Generalized anxiety disorder: Secondary | ICD-10-CM | POA: Diagnosis not present

## 2022-07-30 ENCOUNTER — Telehealth: Payer: Self-pay

## 2022-07-30 NOTE — Telephone Encounter (Signed)
[facility] tried to schedule MRI, patient refused to schedule. FYI

## 2022-08-04 NOTE — Procedures (Signed)
ELECTROENCEPHALOGRAM REPORT  Date of Study: 07/19/2022  Patient's Name: Casey Rowe MRN: [id number] Date of Birth: April 03, 1950  Referring Provider: Marlowe Kays, PA-C  Clinical History: This is a 72 year old woman with fall and possible loss of consciousness. EEG for classification.  Medications: Xanax, Wellbutrin, Buspar  Technical Summary: A multichannel digital 1-hour EEG recording measured by the international 10-20 system with electrodes applied with paste and impedances below 5000 ohms performed in our laboratory with EKG monitoring in an awake and asleep patient.  Hyperventilation was not performed. Photic stimulation was performed.  The digital EEG was referentially recorded, reformatted, and digitally filtered in a variety of bipolar and referential montages for optimal display.    Description: The patient is awake and asleep during the recording.  During maximal wakefulness, there is a symmetric, medium voltage 8-9 Hz posterior dominant rhythm that attenuates with eye opening.  The record is symmetric.  During drowsiness and sleep, there is an increase in theta slowing of the background.  Vertex waves and symmetric sleep spindles were seen. Photic stimulation did not elicit any abnormalities.  There were no epileptiform discharges or electrographic seizures seen.    Technical difficulties with EKG lead.  Impression: This 1-hour awake and asleep EEG is normal.    Clinical Correlation: A normal EEG does not exclude a clinical diagnosis of epilepsy.  If further clinical questions remain, prolonged EEG may be helpful.  Clinical correlation is advised.   Patrcia Dolly, M.D.

## 2022-08-04 NOTE — Progress Notes (Signed)
EEG is normal, no seizure

## 2022-09-08 ENCOUNTER — Encounter (INDEPENDENT_AMBULATORY_CARE_PROVIDER_SITE_OTHER): Payer: Self-pay

## 2022-09-09 DIAGNOSIS — F411 Generalized anxiety disorder: Secondary | ICD-10-CM | POA: Diagnosis not present

## 2022-09-09 DIAGNOSIS — F331 Major depressive disorder, recurrent, moderate: Secondary | ICD-10-CM | POA: Diagnosis not present

## 2022-09-12 ENCOUNTER — Other Ambulatory Visit: Payer: Self-pay | Admitting: Family Medicine

## 2022-09-20 DIAGNOSIS — G5761 Lesion of plantar nerve, right lower limb: Secondary | ICD-10-CM | POA: Diagnosis not present

## 2022-09-20 DIAGNOSIS — G9009 Other idiopathic peripheral autonomic neuropathy: Secondary | ICD-10-CM | POA: Diagnosis not present

## 2022-09-20 DIAGNOSIS — M7742 Metatarsalgia, left foot: Secondary | ICD-10-CM | POA: Diagnosis not present

## 2022-09-20 DIAGNOSIS — M79671 Pain in right foot: Secondary | ICD-10-CM | POA: Diagnosis not present

## 2022-09-20 DIAGNOSIS — G5762 Lesion of plantar nerve, left lower limb: Secondary | ICD-10-CM | POA: Diagnosis not present

## 2022-09-20 DIAGNOSIS — M79672 Pain in left foot: Secondary | ICD-10-CM | POA: Diagnosis not present

## 2022-09-20 DIAGNOSIS — M7741 Metatarsalgia, right foot: Secondary | ICD-10-CM | POA: Diagnosis not present

## 2022-09-22 NOTE — Assessment & Plan Note (Signed)
Continue to monitor and increase po intake especially high calorie dense foods

## 2022-09-22 NOTE — Assessment & Plan Note (Signed)
Monitor and report any concerns, no changes to meds. Encouraged heart healthy diet such as the DASH diet and exercise as tolerated.  ?

## 2022-09-22 NOTE — Assessment & Plan Note (Signed)
Encourage heart healthy diet such as MIND or DASH diet, increase exercise, avoid trans fats, simple carbohydrates and processed foods, consider a krill or fish or flaxseed oil cap daily.  °

## 2022-09-22 NOTE — Assessment & Plan Note (Signed)
Continues to struggle with poor appetite but is trying to maintain protein intake

## 2022-09-22 NOTE — Assessment & Plan Note (Signed)
Supplement and monitor 

## 2022-09-23 ENCOUNTER — Ambulatory Visit: Payer: Medicare PPO | Admitting: Family Medicine

## 2022-09-23 VITALS — BP 122/68 | HR 62 | Temp 98.5°F | Resp 16 | Ht 64.0 in | Wt 110.2 lb

## 2022-09-23 DIAGNOSIS — E538 Deficiency of other specified B group vitamins: Secondary | ICD-10-CM

## 2022-09-23 DIAGNOSIS — R630 Anorexia: Secondary | ICD-10-CM

## 2022-09-23 DIAGNOSIS — R634 Abnormal weight loss: Secondary | ICD-10-CM

## 2022-09-23 DIAGNOSIS — E785 Hyperlipidemia, unspecified: Secondary | ICD-10-CM

## 2022-09-23 DIAGNOSIS — I10 Essential (primary) hypertension: Secondary | ICD-10-CM

## 2022-09-26 NOTE — Progress Notes (Signed)
Patient not seen.

## 2022-10-03 ENCOUNTER — Emergency Department (HOSPITAL_COMMUNITY)
Admission: EM | Admit: 2022-10-03 | Discharge: 2022-10-03 | Disposition: A | Discharge status: Home / Self Care | Payer: Medicare PPO | Source: Home / Self Care | Attending: Emergency Medicine | Admitting: Emergency Medicine

## 2022-10-03 ENCOUNTER — Emergency Department (HOSPITAL_COMMUNITY): Payer: Medicare PPO

## 2022-10-03 ENCOUNTER — Other Ambulatory Visit: Payer: Self-pay

## 2022-10-03 DIAGNOSIS — I10 Essential (primary) hypertension: Secondary | ICD-10-CM | POA: Insufficient documentation

## 2022-10-03 DIAGNOSIS — J45909 Unspecified asthma, uncomplicated: Secondary | ICD-10-CM | POA: Insufficient documentation

## 2022-10-03 DIAGNOSIS — F0390 Unspecified dementia without behavioral disturbance: Secondary | ICD-10-CM | POA: Diagnosis not present

## 2022-10-03 DIAGNOSIS — Z79899 Other long term (current) drug therapy: Secondary | ICD-10-CM | POA: Insufficient documentation

## 2022-10-03 DIAGNOSIS — R251 Tremor, unspecified: Secondary | ICD-10-CM | POA: Diagnosis not present

## 2022-10-03 LAB — CBC WITH DIFFERENTIAL/PLATELET
Abs Immature Granulocytes: 0.01 10*3/uL (ref 0.00–0.07)
Basophils Absolute: 0 10*3/uL (ref 0.0–0.1)
Basophils Relative: 1 %
Eosinophils Absolute: 0.2 10*3/uL (ref 0.0–0.5)
Eosinophils Relative: 7 %
HCT: 35.7 % — ABNORMAL LOW (ref 36.0–46.0)
Hemoglobin: 11.6 g/dL — ABNORMAL LOW (ref 12.0–15.0)
Immature Granulocytes: 0 %
Lymphocytes Relative: 21 %
Lymphs Abs: 0.7 10*3/uL (ref 0.7–4.0)
MCH: 34 pg (ref 26.0–34.0)
MCHC: 32.5 g/dL (ref 30.0–36.0)
MCV: 104.7 fL — ABNORMAL HIGH (ref 80.0–100.0)
Monocytes Absolute: 0.4 10*3/uL (ref 0.1–1.0)
Monocytes Relative: 13 %
Neutro Abs: 1.8 10*3/uL (ref 1.7–7.7)
Neutrophils Relative %: 58 %
Platelets: 172 10*3/uL (ref 150–400)
RBC: 3.41 MIL/uL — ABNORMAL LOW (ref 3.87–5.11)
RDW: 12.4 % (ref 11.5–15.5)
WBC: 3.1 10*3/uL — ABNORMAL LOW (ref 4.0–10.5)
nRBC: 0 % (ref 0.0–0.2)

## 2022-10-03 LAB — COMPREHENSIVE METABOLIC PANEL
ALT: 14 U/L (ref 0–44)
AST: 22 U/L (ref 15–41)
Albumin: 3.5 g/dL (ref 3.5–5.0)
Alkaline Phosphatase: 26 U/L — ABNORMAL LOW (ref 38–126)
Anion gap: 6 (ref 5–15)
BUN: 11 mg/dL (ref 8–23)
CO2: 24 mmol/L (ref 22–32)
Calcium: 9.2 mg/dL (ref 8.9–10.3)
Chloride: 108 mmol/L (ref 98–111)
Creatinine, Ser: 0.84 mg/dL (ref 0.44–1.00)
GFR, Estimated: >60 mL/min (ref >60)
Glucose, Bld: 119 mg/dL — ABNORMAL HIGH (ref 70–99)
Potassium: 3.9 mmol/L (ref 3.5–5.1)
Sodium: 138 mmol/L (ref 135–145)
Total Bilirubin: 0.4 mg/dL (ref 0.3–1.2)
Total Protein: 6.2 g/dL — ABNORMAL LOW (ref 6.5–8.1)

## 2022-10-03 LAB — MAGNESIUM: Magnesium: 1.9 mg/dL (ref 1.7–2.4)

## 2022-10-03 LAB — CK: Total CK: 44 U/L (ref 38–234)

## 2022-10-03 MED ORDER — LORAZEPAM 2 MG/ML IJ SOLN
1.0000 mg | Freq: Once | INTRAMUSCULAR | Status: AC
  Administered 2022-10-03: 1 mg via INTRAVENOUS
  Filled 2022-10-03: qty 1

## 2022-10-03 NOTE — ED Triage Notes (Signed)
Pt arrived via POV. C/o uncontrollable tremors primarily in upper body for 4x days that have been getting worse. Pt able to walk and has been exercising for past few days. Pt states they had severe concussion several months ago.  AOx4

## 2022-10-03 NOTE — ED Provider Notes (Signed)
[place] EMERGENCY DEPARTMENT AT [facility] Provider Note   CSN: [id number] Arrival date & time: 10/03/22  1046     History  Chief Complaint  Patient presents with   Tremors    Casey Rowe is a 72 y.o. female.  Patient is a 72 year old female with past medical history of mild dementia, sarcoidosis, asthma, anorexia, anxiety and hypertension presenting to the emergency department with tremors.  The patient reports over the last few days she has had intermittent tremors mostly involving her arms and occasionally involving the legs as well.  She states that it is worse when she gets up to take a shower.  She states that throughout the weekend it has been lasting for up to an hour at a time and will go away on its own.  She reports that this morning the symptoms were progressive and would not go away which prompted them to come to the emergency department.  She states that she feels like her muscles are spasming and like she has no control over them.  She denies any loss of consciousness during the episodes.  She does report that she had a concussion several months ago but had no complications after this.  She denies any associated headache, numbness or weakness.  She denies any recent nausea, vomiting or diarrhea.  Family does report that she has a poor appetite.  The history is provided by the patient.       Home Medications Prior to Admission medications   Medication Sig Start Date End Date Taking? Authorizing Provider  albuterol (VENTOLIN HFA) 108 (90 Base) MCG/ACT inhaler INHALE 2 PUFFS INTO THE LUNGS EVERY 6 HOURS AS NEEDED FOR WHEEZING 05/12/21   Luciano Cutter, MD  albuterol (VENTOLIN HFA) 108 (90 Base) MCG/ACT inhaler Inhale 1-2 puffs into the lungs every 4 (four) hours as needed for wheezing or shortness of breath. 01/04/22   Luciano Cutter, MD  ALPRAZolam Prudy Feeler) 0.25 MG tablet Take 0.25 mg by mouth daily.     [provider]  azelastine (ASTELIN)  0.1 % nasal spray Place 2 sprays into both nostrils 2 (two) times daily. Use in each nostril as directed 03/16/16   Bradd Canary, MD  buPROPion (WELLBUTRIN XL) 300 MG 24 hr tablet Take 300 mg by mouth daily.    [provider]  busPIRone (BUSPAR) 5 MG tablet Take 5 mg by mouth 3 (three) times daily.     [provider]  cholestyramine (QUESTRAN) 4 g packet Take 1 packet (4 g total) by mouth 2 (two) times daily as needed. 06/08/22   Bradd Canary, MD  estradiol (ESTRACE) 1 MG tablet TAKE 1 TABLET BY MOUTH DAILY 01/29/22   [name], [name], MD  fenofibrate micronized (LOFIBRA) 134 MG capsule TAKE ONE CAPSULE BY MOUTH EVERY MORNING BEFORE BREAKFAST 09/13/22   Bradd Canary, MD  fluticasone-salmeterol (ADVAIR DISKUS) 250-50 MCG/ACT AEPB Inhale 1 puff into the lungs in the morning and at bedtime. 01/04/22   Luciano Cutter, MD  losartan (COZAAR) 50 MG tablet TAKE 1 TABLET BY MOUTH DAILY 09/13/22   Bradd Canary, MD  metoprolol succinate (TOPROL-XL) 50 MG 24 hr tablet TAKE 1 TABLET BY MOUTH DAILY WITH A MEAL 09/13/22   Bradd Canary, MD  neomycin-polymyxin-hydrocortisone (CORTISPORIN) 3.5-10000-1 ophthalmic suspension Place 3 drops into both eyes 3 (three) times daily. 02/16/22   Bradd Canary, MD  Peppermint Oil (IBGARD) 90 MG CPCR Take 2 capsules by mouth 2 (  two) times daily as needed. 06/08/22   Bradd Canary, MD  progesterone (PROMETRIUM) 100 MG capsule TAKE ONE CAPSULE BY MOUTH DAILY 08/13/21   [name], DO  progesterone (PROMETRIUM) 100 MG capsule TAKE 1 CAPSULE BY MOUTH DAILY 01/29/22   [name], [name], MD  saccharomyces boulardii (FLORASTOR) 250 MG capsule Take 250 mg by mouth daily.    [provider]      Allergies    Demerol, Singulair [montelukast], and Augmentin [amoxicillin-pot clavulanate]    Review of Systems   Review of Systems  Physical Exam Updated Vital Signs BP 124/83 (BP Location: Left Arm)   Pulse 81   Temp 97.9 F (36.6 C) (Oral)    Ht 5\' 4"  (1.626 m)   Wt 49.9 kg   LMP 02/08/1997   SpO2 98%   BMI 18.88 kg/m  Physical Exam Vitals and nursing note reviewed.  Constitutional:      General: She is not in acute distress.    Appearance: Normal appearance.  HENT:     Head: Normocephalic and atraumatic.     Nose: Nose normal.     Mouth/Throat:     Mouth: Mucous membranes are moist.     Pharynx: Oropharynx is clear.  Eyes:     Extraocular Movements: Extraocular movements intact.     Conjunctiva/sclera: Conjunctivae normal.     Pupils: Pupils are equal, round, and reactive to light.  Cardiovascular:     Rate and Rhythm: Normal rate and regular rhythm.     Heart sounds: Normal heart sounds.  Pulmonary:     Effort: Pulmonary effort is normal.     Breath sounds: Normal breath sounds.  Abdominal:     General: Abdomen is flat.     Palpations: Abdomen is soft.     Tenderness: There is no abdominal tenderness.  Musculoskeletal:        General: Normal range of motion.     Cervical back: Normal range of motion.     Comments: Tremoring in bilateral upper extremities at rest, is able to follow commands in all 4 extremities  Skin:    General: Skin is warm and dry.  Neurological:     General: No focal deficit present.     Mental Status: She is alert and oriented to person, place, and time.     Cranial Nerves: No cranial nerve deficit.     Sensory: No sensory deficit.     Motor: No weakness.  Psychiatric:        Mood and Affect: Mood normal.        Behavior: Behavior normal.     ED Results / Procedures / Treatments   Labs (all labs ordered are listed, but only abnormal results are displayed) Labs Reviewed  COMPREHENSIVE METABOLIC PANEL - Abnormal; Notable for the following components:      Result Value   Glucose, Bld 119 (*)    Total Protein 6.2 (*)    Alkaline Phosphatase 26 (*)    All other components within normal limits  CBC WITH DIFFERENTIAL/PLATELET - Abnormal; Notable for the following components:    WBC 3.1 (*)    RBC 3.41 (*)    Hemoglobin 11.6 (*)    HCT 35.7 (*)    MCV 104.7 (*)    All other components within normal limits  MAGNESIUM  CK  URINALYSIS, ROUTINE W REFLEX MICROSCOPIC    EKG EKG Interpretation Date/Time:  Sunday October 03 2022 11:13:07 EDT Ventricular Rate:  75 PR Interval:  185 QRS Duration:  97 QT Interval:  388 QTC Calculation: 434 R Axis:   41  Text Interpretation: Sinus rhythm Anterior infarct, old No significant change since last tracing Confirmed by Elayne Snare ([id number]) on 10/03/2022 11:35:43 AM  Radiology CT Head Wo Contrast  Result Date: 10/03/2022 CLINICAL DATA:  Provided history: Mental status change, unknown cause. Additional history provided: Uncontrollable tremors. Fall. EXAM: CT HEAD WITHOUT CONTRAST TECHNIQUE: Contiguous axial images were obtained from the base of the skull through the vertex without intravenous contrast. RADIATION DOSE REDUCTION: This exam was performed according to the departmental dose-optimization program which includes automated exposure control, adjustment of the mA and/or kV according to patient size and/or use of iterative reconstruction technique. COMPARISON:  Head CT 06/21/2022. FINDINGS: Brain: Generalized cerebral atrophy. Partially empty sella turcica. There is no acute intracranial hemorrhage. No demarcated cortical infarct. No extra-axial fluid collection. No evidence of an intracranial mass. No midline shift. Vascular: No hyperdense vessel. Skull: No calvarial fracture or aggressive osseous lesion. Sinuses/Orbits: No mass or acute finding within the imaged orbits. Minimal mucosal thickening within the right maxillary sinus. Small-volume secretions within the left maxillary sinus. Small fluid levels and/or mild mucosal thickening within the bilateral sphenoid sinuses. Mild mucosal thickening within bilateral ethmoid air cells. Near complete opacification of the right frontal sinus. Mild mucosal thickening within the left  frontal sinus. IMPRESSION: 1. No evidence of an acute intracranial abnormality. 2. Generalized cerebral atrophy. 3. Paranasal sinus disease as described. Electronically Signed   By: Jackey Loge D.O.   On: 10/03/2022 12:52    Procedures Procedures    Medications Ordered in ED Medications  LORazepam (ATIVAN) injection 1 mg (1 mg Intravenous Given 10/03/22 1215)    ED Course/ Medical Decision Making/ A&P Clinical Course as of 10/03/22 1446  Sun Oct 03, 2022  1330 After receiving ativan, patient is asleep, no longer tremoring. CTH without acute disease. Labs without significant abnormality to explain symptoms. Will discuss with neurology if she may need further work up vs outpatient follow up. [VK]  1422 I spoke with Dr. Otelia Limes who has low suspicion for seizure or other neurologic etiology for tremor, does not recommend EEG at this time. Recommends outpatient primary care follow up for further work up. [VK]  1443 Patient is awake and alert well appearing at this time without active tremor. She is stable for discharge home. Family updated at bedside. [VK]    Clinical Course User Index [VK] Rexford Maus, DO                                 Medical Decision Making This patient presents to the ED with chief complaint(s) of tremors with pertinent past medical history of HTN, mild dementia, recent concussion, anxiety, anorexia which further complicates the presenting complaint. The complaint involves an extensive differential diagnosis and also carries with it a high risk of complications and morbidity.    The differential diagnosis includes concern for ICH, mass effect, electrolyte abnormality, dehydration, anxiety, medication side effect, patient remains awake and alert making a seizure less likely  Additional history obtained: Additional history obtained from family Records reviewed outpatient neurology records  ED Course and Reassessment: On patient's arrival she is hemodynamically  stable in no acute distress.  She does have tremors in her bilateral upper extremities at rest though she is still able to follow commands.  Patient will have labs to evaluate for electrolyte derangement as  well as head CT to evaluate for cause of her symptoms.  She will be trialed Ativan to help with her symptoms and will be closely reassessed.  Independent labs interpretation:  The following labs were independently interpreted: Within normal range  Independent visualization of imaging: - I independently visualized the following imaging with scope of interpretation limited to determining acute life threatening conditions related to emergency care: CT head, which revealed no acute disease  Consultation: - Consulted or discussed management/test interpretation w/ external professional: Neurology  Consideration for admission or further workup: Patient has no emergent conditions requiring admission or further work-up at this time and is stable for discharge home with primary care follow-up  Social Determinants of health: N/A    Amount and/or Complexity of Data Reviewed Labs: ordered. Radiology: ordered.  Risk Prescription drug management.          Final Clinical Impression(s) / ED Diagnoses Final diagnoses:  Occasional tremors    Rx / DC Orders ED Discharge Orders     None         Rexford Maus, DO 10/03/22 1446

## 2022-10-03 NOTE — Discharge Instructions (Addendum)
Were seen in the emergency department for your tremors.  Your workup showed no signs of abnormal electrolytes or abnormality in your brain to explain your tremors.  Spoke with neurology and there is low concern that this is seizure-like activity.  He recommend that you follow-up with your primary doctor for continued tremor workup.  You should return to the emergency department if you are unresponsive during her tremor episodes, it happens on one side of the body compared to the other or you have weakness on one side the body compared to the other or if you have any other new or concerning symptoms.

## 2022-10-04 ENCOUNTER — Encounter: Payer: Self-pay | Admitting: Family Medicine

## 2022-10-04 ENCOUNTER — Ambulatory Visit: Payer: Medicare PPO | Admitting: Family Medicine

## 2022-10-04 VITALS — BP 141/73 | HR 66 | Temp 98.0°F | Ht 64.0 in | Wt 113.0 lb

## 2022-10-04 DIAGNOSIS — R251 Tremor, unspecified: Secondary | ICD-10-CM | POA: Diagnosis not present

## 2022-10-04 DIAGNOSIS — R410 Disorientation, unspecified: Secondary | ICD-10-CM

## 2022-10-04 LAB — POC URINALSYSI DIPSTICK (AUTOMATED)
Bilirubin, UA: NEGATIVE
Blood, UA: NEGATIVE
Glucose, UA: NEGATIVE
Ketones, UA: NEGATIVE
Leukocytes, UA: NEGATIVE
Nitrite, UA: NEGATIVE
Protein, UA: NEGATIVE
Spec Grav, UA: 1.01 (ref 1.010–1.025)
Urobilinogen, UA: 0.2 E.U./dL
pH, UA: 7 (ref 5.0–8.0)

## 2022-10-04 NOTE — Progress Notes (Signed)
Acute Office Visit  Subjective:     Patient ID: Casey Rowe, female    DOB: [birth year], 72 y.o.   MRN: [id number]  Chief Complaint  Patient presents with   Tremors   Hospitalization Follow-up    HPI Patient is in today for tremors, confusion.  Discussed the use of AI scribe software for clinical note transcription with the patient, who gave verbal consent to proceed.  History of Present Illness   The patient, accompanied by their daughter and spouse, presents with a chief complaint of tremors and confusion. The onset of these symptoms was approximately 3 months ago, following a fall that resulted in a concussion in May. The patient reports that the tremors have worsened significantly over the past week, affecting their entire body. The tremors are not associated with any loss of consciousness or postictal state, and can be somewhat controlled by distraction and refocusing. The patient's daughter, who is also their caregiver, notes that stress and anxiety exacerbate the tremors.  The patient also reports a recent episode of confusion regarding their medication regimen, leading to a period of non-adherence. This confusion is a new symptom and is concerning to the patient and their family. The patient's daughter also reports that the patient has been experiencing increased difficulty with ambulation, including shuffling and scuffing of the feet, for over a year.  The patient has a history of anorexia and has been consuming a low-calorie diet, with an estimated intake of around 600 calories per day. The patient's daughter reports that the patient often refuses to eat, particularly in the afternoon and evening. The patient's protein intake is reportedly low, and they have been resistant to consuming protein supplements due to their sweet taste.  The patient has a scheduled appointment with a neurologist in two weeks, and an MRI of the brain has been ordered but not yet scheduled. The  patient's daughter is concerned about the patient's memory issues and is seeking further evaluation and potential treatment.          ROS All review of systems negative except what is listed in the HPI      Objective:    BP (!) 141/73   Pulse 66   Temp 98 F (36.7 C) (Oral)   Ht 5\' 4"  (1.626 m)   Wt 113 lb (51.3 kg)   LMP 02/08/1997   SpO2 97%   BMI 19.40 kg/m    Physical Exam Vitals reviewed.  Constitutional:      Appearance: Normal appearance.  Cardiovascular:     Rate and Rhythm: Normal rate and regular rhythm.  Pulmonary:     Effort: Pulmonary effort is normal.     Breath sounds: Normal breath sounds.  Musculoskeletal:     Cervical back: Normal range of motion and neck supple. No tenderness.  Lymphadenopathy:     Cervical: No cervical adenopathy.  Skin:    General: Skin is warm and dry.  Neurological:     Mental Status: She is alert.     Cranial Nerves: No cranial nerve deficit.     Motor: No weakness.     Gait: Gait normal.     Comments: Functional tremor R >L   Psychiatric:        Mood and Affect: Mood normal.        Behavior: Behavior normal.        Thought Content: Thought content normal.       10/04/2022    4:08 PM  MMSE - Mini  Mental State Exam  Orientation to time 4  Orientation to Place 4  Registration 3  Attention/ Calculation 5  Recall 1  Language- name 2 objects 2  Language- repeat 1  Language- follow 3 step command 3  Language- read & follow direction 1  Write a sentence 1  Copy design 1  Total score 26        Results for orders placed or performed in visit on 10/04/22  POCT Urinalysis Dipstick (Automated)  Result Value Ref Range   Color, UA yellow    Clarity, UA clear    Glucose, UA Negative Negative   Bilirubin, UA negative    Ketones, UA negative    Spec Grav, UA 1.010 1.010 - 1.025   Blood, UA negative    pH, UA 7.0 5.0 - 8.0   Protein, UA Negative Negative   Urobilinogen, UA 0.2 0.2 or 1.0 E.U./dL   Nitrite,  UA negative    Leukocytes, UA Negative Negative        Assessment & Plan:   Problem List Items Addressed This Visit   None Visit Diagnoses     Confusion    -  Primary   Relevant Orders   POCT Urinalysis Dipstick (Automated) (Completed)   Urine Culture   Tremor         Labs and CT at ED yesterday were stable. Urine culture sent to lab - we will let you know when the results are in  In the meantime, stay well hydrated, increase protein intake, get good rest.  Safety measures discussed.  Keep upcoming neurology appointment and ask about previously ordered MRI.          No orders of the defined types were placed in this encounter.   Return in about 3 months (around 01/04/2023) for routine follow-up.  Clayborne Dana, NP

## 2022-10-04 NOTE — Patient Instructions (Signed)
Urine culture sent to lab - we will let you know when the results are in  In the meantime, stay well hydrated, increase protein intake, get good rest.  Safety measures discussed.  Keep upcoming neurology appointment and ask about previously ordered MRI.

## 2022-10-05 LAB — URINE CULTURE
MICRO NUMBER:: 15381125
Result:: NO GROWTH
SPECIMEN QUALITY:: ADEQUATE

## 2022-10-18 DIAGNOSIS — M7742 Metatarsalgia, left foot: Secondary | ICD-10-CM | POA: Diagnosis not present

## 2022-10-18 DIAGNOSIS — G5761 Lesion of plantar nerve, right lower limb: Secondary | ICD-10-CM | POA: Diagnosis not present

## 2022-10-18 DIAGNOSIS — M79671 Pain in right foot: Secondary | ICD-10-CM | POA: Diagnosis not present

## 2022-10-18 DIAGNOSIS — M79672 Pain in left foot: Secondary | ICD-10-CM | POA: Diagnosis not present

## 2022-10-18 DIAGNOSIS — G9009 Other idiopathic peripheral autonomic neuropathy: Secondary | ICD-10-CM | POA: Diagnosis not present

## 2022-10-18 DIAGNOSIS — G5762 Lesion of plantar nerve, left lower limb: Secondary | ICD-10-CM | POA: Diagnosis not present

## 2022-10-18 DIAGNOSIS — M7741 Metatarsalgia, right foot: Secondary | ICD-10-CM | POA: Diagnosis not present

## 2022-10-20 ENCOUNTER — Ambulatory Visit: Payer: Medicare PPO | Admitting: Physician Assistant

## 2022-10-22 ENCOUNTER — Ambulatory Visit: Payer: Medicare PPO | Admitting: Physician Assistant

## 2022-10-22 ENCOUNTER — Encounter: Payer: Self-pay | Admitting: Physician Assistant

## 2022-10-22 VITALS — BP 140/70 | HR 60 | Resp 20 | Ht 64.0 in | Wt 113.0 lb

## 2022-10-22 DIAGNOSIS — R413 Other amnesia: Secondary | ICD-10-CM

## 2022-10-22 MED ORDER — DONEPEZIL HCL 10 MG PO TABS: ORAL_TABLET | ORAL | 11 refills | Status: DC

## 2022-10-22 NOTE — Patient Instructions (Addendum)

## 2022-10-22 NOTE — Progress Notes (Signed)
Assessment/Plan:   Memory Impairment of unclear etiology  Casey Rowe is a delightful 72 y.o. RH female with a history of hypertension, hyperlipidemia, anxiety, depression, presenting today for planned discussion of the MRI results which has been ordered by [place], but it was never performed, because the patient canceled the appointment.  Moreover, she also canceled the appointment with neuropsych evaluation which would give [place] clarity of her diagnosis.  This patient is accompanied in the office by her husband and son who supplements the history. Previous records as well as any outside records available were reviewed prior to todays visit.   Patient was last seen on 07/16/22 with [facility] 24/30   Explained the importance of proceeding with this testing, as it would give [place] clarity of her diagnosis, as we are trying to find the etiology of her memory issues.  The patient and her family understood its importance, and will proceed with further workup. Recently, she was seen at the ED with intermittent tremors, but CT of the head was performed, which shows no acute intracranial abnormalities although mild atrophy is noted.  Inpatient neurology evaluated the patient, with negative workup with a functional component suspected she received Ativan and the tremors resolved.  Discussed with her husband and son, that regardless of the diagnosis after neuropsych evaluation, patient can benefit of psychiatric follow-up for anxiety and depression.  Neuropsych evaluation for clarity of the diagnosis Start donepezil 10 mg daily, side effects discussed MRI of the brain to look at the structures and vascular load. Recommend good control of cardiovascular risk factors Continue to control mood as per PCP, recommend psychiatric evaluation for anxiety and depression.  Update  repeats oneself?  Endorsed.  She confabulates more.  For example, she reports that she retired recently, but she has not been working since  March. Disoriented when walking into a room?  Patient denies  Leaving objects in unusual places?  Patient denies She leaves things in a way that she may trip over.   Wandering behavior?   denies   Any personality changes since last visit?   denies   Any worsening depression?:  This is unclear.  She denies any depression, but her family reports that she may have some mood changes. Hallucinations or paranoia?  denies   Seizures?   denies    Any sleep changes? Sleeps well  Sleep apnea?   denies   Any hygiene concerns?  Denies she wants to look good, but she tends to trip with sandals.  Independent of bathing and dressing?  Endorsed  Does the patient needs help with medications? Patient is in charge   Who is in charge of the finances?  Patient is in charge     Any changes in appetite?  She has anorexia, consuming a low caloric diet about 600 cal daily.  Often, she refuses to eat especially in the afternoon and evening.    Patient have trouble swallowing?  denies   Does the patient cook?  Any kitchen accidents such as leaving the stove on?   denies   Any headaches?    denies   Vision changes? denies Chronic pain?  denies   Ambulates with difficulty?  Subjective shuffling and scuffing of feet over a year   Recent falls or head injuries?    denies      Unilateral weakness, numbness or tingling?   denies   Any tremors?  She was recently seen at the ED on 10/03/2022 with complaints of "tremors "described as  intermittent, mostly involving her arms, occasionally her legs, worse when taking a shower, then resolving.  No LOC or postictal state, controlled by distraction and refocusing.  She did report that anxiety and stress were exacerbating them.  She received Ativan and the tremors resolved.  It was felt that there were no neurological abnormalities for this tremors, no other EEG or any other neurological workup was recommended by [facility] neurology.  Suspect FT etiology Any anosmia?    denies   Any  incontinence of urine?  denies   Any bowel dysfunction?  Denies    Patient lives with husband Does the patient drive?  Currently she is not driving  Initial visit 07/14/4401  How long did patient have memory difficulties?  Patient denies that she has any memory issues.  She states that she is a Runner, broadcasting/film/video, is smart, and "everybody has some moments but that does not mean that I have memory problems ".  Her husband does report that she had some memory issues for the last 1-1/2 years, especially about appointments.  She does repeat herself sometimes.  Patient disagrees with her husband regarding this issue.  She states that she likes to read extensively, is fairly active.  She states that she continues to sub repeats oneself?  Endorsed Disoriented when walking into a room?  Patient denies  Leaving objects in unusual places? denies   Wandering behavior?  denies   Any personality changes?  She adamantly says no.  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 "Everybody is human" Any history of depression?: Endorsed for the last 4 y Hallucinations or paranoia?  Patient denies   Seizures?   Patient denies, however, she sustained a fall recently as May 2024  for which she was seen in the ER.  It is unclear if she had a syncopal episode, although she was cleared for cardiac issues.  She is not sure if she had a seizure though there were no bite marks on her tongue, but her husband states that she did have some shaking when he looked at her, no foaming in the mouth.She does not remember having fallen while in the bathroom.  No stitches were received, but she did have a significant bruise in the right above the brow area.  Any sleep changes? Sleeps well. Denies vivid dreams, REM behavior or sleepwalking   Sleep apnea?  Patient denies   Any hygiene concerns?  Patient denies   Independent of bathing and dressing?  Endorsed  Does the  patient needs help with medications? Patient is in charge  "She forgets if she took them or not" Who is in charge of the finances? Patient is in charge, she is on auto-pay     Any changes in appetite?  Decreased. "Lost my ability to be hungry". She reports drinking water "She needs to be reminded though " Patient have trouble swallowing? denies   Does the patient cook?  Yes  Any kitchen accidents such as leaving the stove on? denies   Any headaches?  Denies  Chronic back pain ? denies   Ambulates with difficulty?  denies . [facility] [employer] , exercises frequently.  Recent falls or head injuries?  As mentioned above, she sustained an unwitnessed head trauma while in the bathroom, she hit her forehead and sustained a large hematoma on the right side CT of the head and maxillofacial was negative for  acute intracranial abnormalities, or bone fractures.   Vision changes? denies   Unilateral weakness, numbness or tingling? denies  History of neuropathy followed in [facility].  She states that her neuropathy may cause some of her hesitancy when walking. Any tremors?   denies   Any anosmia? "Maybe, it is kind of weird for the last 2 years, loosens up with diet and losing the sense of smell" Any incontinence of urine? denies   Any bowel dysfunction? Chronic diarrhea for the last year. She takes IB guard daily and Imodium.  She is to follow-up with GI at some point Patient lives with her husband History of heavy alcohol intake? Drinks wine, 5-6 glasses a week  History of heavy tobacco use? denies   Family history of dementia?  Mother had Alzheimer's dementia.  Does patient drive?  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  Recent CT of the head taken after a fall with transient alteration of awareness which required the ED presentation for further workup with no apparent cardiac etiology was remarkable for mild to  moderate cortical atrophy but within normal limits for patient age, as well as mild chronic ischemic white matter changes.  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  Past Medical History:  Diagnosis Date   Allergy    Anxiety    Asthma    Broken jaw (HCC) 1980s   Depression    Depression with anxiety    Hypertension    Preventative health care 03/16/2015   Sarcoidosis    Tubal pregnancy 1981     Past Surgical History:  Procedure Laterality Date   ABDOMINAL HYSTERECTOMY     CESAREAN SECTION     ECTOPIC PREGNANCY SURGERY  1981   FRACTURE SURGERY  1980s   broken jaw     PREVIOUS MEDICATIONS:   CURRENT MEDICATIONS:  Outpatient Encounter Medications as of 10/22/2022  Medication Sig   albuterol (VENTOLIN HFA) 108 (90 Base) MCG/ACT inhaler INHALE 2 PUFFS INTO THE LUNGS EVERY 6 HOURS AS NEEDED FOR WHEEZING   albuterol (VENTOLIN HFA) 108 (90 Base) MCG/ACT inhaler Inhale 1-2 puffs into the lungs every 4 (four) hours as needed for wheezing or shortness of breath.   ALPRAZolam (XANAX) 0.25 MG tablet Take 0.25 mg by mouth daily.    azelastine (ASTELIN) 0.1 % nasal spray Place 2 sprays into both nostrils 2 (two) times daily. Use in each nostril as directed   buPROPion (WELLBUTRIN XL) 300 MG 24 hr tablet Take 300 mg by mouth daily.   busPIRone (BUSPAR) 5 MG tablet Take 5 mg by mouth 3 (three) times daily.    cholestyramine (QUESTRAN) 4 g packet Take 1 packet (4 g total) by mouth 2 (two) times daily as needed.   estradiol (ESTRACE) 1 MG tablet TAKE 1 TABLET BY MOUTH DAILY   fenofibrate micronized (LOFIBRA) 134 MG capsule TAKE ONE CAPSULE BY MOUTH EVERY MORNING BEFORE BREAKFAST   fluticasone-salmeterol (ADVAIR DISKUS) 250-50 MCG/ACT AEPB Inhale 1 puff into the lungs in the morning and at bedtime.   losartan (COZAAR) 50 MG tablet TAKE 1 TABLET BY MOUTH DAILY   metoprolol succinate (TOPROL-XL) 50 MG 24 hr tablet TAKE 1  TABLET BY MOUTH DAILY WITH A MEAL   neomycin-polymyxin-hydrocortisone (CORTISPORIN) 3.5-10000-1 ophthalmic suspension Place 3 drops into both eyes 3 (three) times daily.  Peppermint Oil (IBGARD) 90 MG CPCR Take 2 capsules by mouth 2 (two) times daily as needed.   progesterone (PROMETRIUM) 100 MG capsule TAKE ONE CAPSULE BY MOUTH DAILY   progesterone (PROMETRIUM) 100 MG capsule TAKE 1 CAPSULE BY MOUTH DAILY   saccharomyces boulardii (FLORASTOR) 250 MG capsule Take 250 mg by mouth daily.   No facility-administered encounter medications on file as of 10/22/2022.         07/16/2022   12:00 PM  Montreal Cognitive Assessment   Visuospatial/ Executive (0/5) 4  Naming (0/3) 3  Attention: Read list of digits (0/2) 2  Attention: Read list of letters (0/1) 1  Attention: Serial 7 subtraction starting at 100 (0/3) 3  Language: Repeat phrase (0/2) 2  Language : Fluency (0/1) 0  Abstraction (0/2) 2  Delayed Recall (0/5) 1  Orientation (0/6) 6  Total 24  Adjusted Score (based on education) 24       10/04/2022    4:08 PM  MMSE - Mini Mental State Exam  Orientation to time 4  Orientation to Place 4  Registration 3  Attention/ Calculation 5  Recall 1  Language- name 2 objects 2  Language- repeat 1  Language- follow 3 step command 3  Language- read & follow direction 1  Write a sentence 1  Copy design 1  Total score 26       Movement examination: Tone: There is normal tone in the UE/LE.  No cogwheeling. Abnormal movements: No tremor seen today's visit.  No myoclonus.  No asterixis.   Coordination:  There is no decremation with RAM's. Normal finger to nose  Gait and Station: The patient has no difficulty arising out of a deep-seated chair without the use of the hands. The patient's stride length is good.  Gait is cautious and narrow.   Thank you for allowing [place] the opportunity to participate in the care of this nice patient. Please do not hesitate to contact us for any questions or  concerns.   Total time spent on today's visit was 33 minutes dedicated to this patient today, preparing to see patient, examining the patient, ordering tests and/or medications and counseling the patient, documenting clinical information in the EHR or other health record, independently interpreting results and communicating results to the patient/family, discussing treatment and goals, answering patient's questions and coordinating care.  Cc:  Bradd Canary, MD  Marlowe Kays 10/22/2022 1:33 PM

## 2022-10-28 ENCOUNTER — Telehealth: Payer: Self-pay | Admitting: Physician Assistant

## 2022-10-28 NOTE — Telephone Encounter (Signed)
Pt called in stating she started taking the donepezil and it has caused outrageous diarrhea and vomiting. She does not want to take it again. She wants to make sure she can stop taking it without a taper?

## 2022-10-28 NOTE — Telephone Encounter (Signed)
Patient advised to hold aricept, will follow up after MRI. To discuss follow up plan. Thanked me for calling.

## 2022-11-02 ENCOUNTER — Encounter: Payer: Self-pay | Admitting: Psychology

## 2022-11-02 ENCOUNTER — Ambulatory Visit: Payer: Self-pay

## 2022-11-02 ENCOUNTER — Other Ambulatory Visit (HOSPITAL_BASED_OUTPATIENT_CLINIC_OR_DEPARTMENT_OTHER): Payer: Self-pay

## 2022-11-02 ENCOUNTER — Ambulatory Visit: Payer: Medicare PPO | Admitting: Psychology

## 2022-11-02 DIAGNOSIS — S060X1S Concussion with loss of consciousness of 30 minutes or less, sequela: Secondary | ICD-10-CM | POA: Diagnosis not present

## 2022-11-02 DIAGNOSIS — G3184 Mild cognitive impairment, so stated: Secondary | ICD-10-CM | POA: Insufficient documentation

## 2022-11-02 DIAGNOSIS — R630 Anorexia: Secondary | ICD-10-CM | POA: Diagnosis not present

## 2022-11-02 DIAGNOSIS — R4189 Other symptoms and signs involving cognitive functions and awareness: Secondary | ICD-10-CM

## 2022-11-02 HISTORY — DX: Mild cognitive impairment of uncertain or unknown etiology: G31.84

## 2022-11-02 MED ORDER — COVID-19 MRNA VAC-TRIS(PFIZER) 30 MCG/0.3ML IM SUSY
0.3000 mL | PREFILLED_SYRINGE | Freq: Once | INTRAMUSCULAR | 0 refills | Status: AC
Start: 2022-11-02 — End: 2022-11-03
  Filled 2022-11-02: qty 0.3, 1d supply, fill #0

## 2022-11-02 MED ORDER — INFLUENZA VAC A&B SURF ANT ADJ 0.5 ML IM SUSY
0.5000 mL | PREFILLED_SYRINGE | Freq: Once | INTRAMUSCULAR | 0 refills | Status: AC
  Filled 2022-11-02: qty 0.5, 1d supply, fill #0

## 2022-11-02 NOTE — Progress Notes (Signed)
   Psychometrician Note   Cognitive testing was administered to Lenell Antu by Shan Levans, B.S. (psychometrist) under the supervision of Dr. Newman Nickels, Ph.D., licensed psychologist on 11/02/2022. Ms. Plett did not appear overtly distressed by the testing session per behavioral observation or responses across self-report questionnaires. Rest breaks were offered.    The battery of tests administered was selected by Dr. Newman Nickels, Ph.D. with consideration to Ms. Sampey's current level of functioning, the nature of her symptoms, emotional and behavioral responses during interview, level of literacy, observed level of motivation/effort, and the nature of the referral question. This battery was communicated to the psychometrist. Communication between Dr. Newman Nickels, Ph.D. and the psychometrist was ongoing throughout the evaluation and Dr. Newman Nickels, Ph.D. was immediately accessible at all times. Dr. Newman Nickels, Ph.D. provided supervision to the psychometrist on the date of this service to the extent necessary to assure the quality of all services provided.    Lenell Antu will return within approximately 1-2 weeks for an interactive feedback session with Dr. Milbert Coulter at which time her test performances, clinical impressions, and treatment recommendations will be reviewed in detail. Ms. Mottl understands she can contact our office should she require our assistance before this time.  A total of 155 minutes of billable time were spent face-to-face with Ms. Antonini by the psychometrist. This includes both test administration and scoring time. Billing for these services is reflected in the clinical report generated by Dr. Newman Nickels, Ph.D.  This note reflects time spent with the psychometrician and does not include test scores or any clinical interpretations made by Dr. Milbert Coulter. The full report will follow in a separate note.

## 2022-11-02 NOTE — Progress Notes (Signed)
NEUROPSYCHOLOGICAL EVALUATION [place]. [facility] Department of Neurology  Date of Evaluation: November 02, 2022  Reason for Referral:   Casey Rowe is a 72 y.o. right-handed Caucasian female referred by Marlowe Kays, PA-C, to characterize her current cognitive functioning and assist with diagnostic clarity and treatment planning in the context of subjective cognitive decline.   Assessment and Plan:   Clinical Impression(s): Casey Rowe's pattern of performance is suggestive of significant impairment surrounding essentially all aspects of verbal learning and memory. Further performance variability/weakness was exhibited across processing speed and executive functioning. Performances were appropriate relative to age-matched peers across attention/concentration, safety/judgment, receptive and expressive language, visuospatial abilities, and visual learning and memory. Functionally, Casey Rowe denied difficulties completing instrumental activities of daily living (ADLs) independently. Her husband did express acute concerns surrounding poor medication adherence to the extent that he had taken over these responsibilities during the prior 2-3 weeks. Overall, given evidence for cognitive dysfunction described above, she meets criteria for a Mild Neurocognitive Disorder ("mild cognitive impairment") at the present time.  The etiology for her ongoing cognitive impairment is unclear. Medical records suggest concerns for anorexia, including Casey Rowe recently describing to her neurologist that she typically consumes a low caloric diet of 600 calories daily. Past research on individuals with anorexia has detailed noteworthy structural changes where neuroimaging (i.e., brain MRIs) suggest gray and white matter reduction correlating with the extent of malnourishment. From a cognitive perspective, there is a long and well established correlation between malnutrition and diminished  cognitive abilities. Primary affected domains commonly include processing speed, attention/concentration, executive functioning, visuospatial/constructional abilities, and aspects of both verbal and visual memory. Her patterns of weakness across the current evaluation do align with expectations fairly well. While she has not yet had a brain MRI completed (this is currently scheduled for 11/14/2022), a prior head CT in 2024 revealed mild to moderate generalized cortical atrophy, also aligning reasonably well with what can be seen in this presentation. These difficulties could be exacerbated by the potential for elevated alcohol use/abuse. Overall, this remains a plausible explanation for ongoing dysfunction and adequate medical monitoring will be important moving forward.   Neurologically speaking, I am unable to rule out the early stages of an underlying neurodegenerative illness at the present time. Across verbal memory tasks, Casey Rowe did not benefit from repeated exposure to information. After a brief delay, she was fully amnestic (i.e., 0% retention) across 2/3 tasks and performed very poorly across 2/3 yes/no recognition trials. Taken together, this suggests growing concern for rapid forgetting and an evolving storage impairment, both of which are hallmark testing patterns of Alzheimer's disease. It is encouraging that expressive language, visuospatial performances, and visual memory were normatively appropriate. If this illness is present, it would appear to be in early stages. However, while I cannot rule out this illness, it also should not be ruled in with confidence given the impact of potential malnourishment as described above.   Specific to her concerns surrounding her concussion this past May causing cognitive disruptions, medical records suggest her husband describing memory concerns a year prior to this event taking place and current patterns would be quite unexpected and atypical for a  concussion presentation. All neuroimaging at the time of her injury was negative for any focal brain damage or intracranial bleeds. Nearly all individuals who sustain a mild head injury such as this fully recover within a few days to a few weeks following this injury. Overall, it is quite unlikely that  this mild head injury is the primary culprit for ongoing memory impairment and cognitive dysfunction.   Recommendations: Casey Rowe is strongly encouraged to work with her medical providers (and potentially a dietician) to significantly elevate her daily caloric intake. Treatment may depend on the root cause for diminished intake. If there are underlying body image concerns, individual psychotherapy would be strongly recommended. If not and it is solely based on decreased appetite, she should work on ways to stimulate her appetite, pharmaceutically and otherwise. If malnourishment is the primary culprit for ongoing impairment, there is the potential for some improvement over time if her caloric intake improves.   Casey Rowe appeared quite reluctant to divulge overall alcohol intake during interview. With multiple rounds of direct questioning, she ultimately stated that she consumes several glasses of wine "a lot of nights" throughout the week. The CDC considers "heavy drinking" for women as 8 or more alcoholic beverages per week. Given this estimate, there would appear a decent likelihood that Casey Rowe is exceeding this amount, potentially by a significant amount. She is encouraged to diminish her overall alcohol intake.  A repeat neuropsychological evaluation in 12-18 months is recommended to assess the trajectory of future cognitive decline should it occur. This will also aid in future efforts towards improved diagnostic clarity.  Casey Rowe has already been prescribed a medication aimed to address memory loss and concerns surrounding Alzheimer's disease (i.e., donepezil/Aricept). She is encouraged to  continue taking this medication as prescribed. It is important to highlight that this medication has been shown to slow functional decline in some individuals. There is no current treatment which can stop or reverse cognitive decline when caused by a neurodegenerative illness.   Performance across neurocognitive testing is not a strong predictor of an individual's safety operating a motor vehicle. Should her family wish to pursue a formalized driving evaluation, they could reach out to the following agencies: [employer] in [place]: [phone] [facility]: [phone] [facility]: [phone] [facility]: [phone] or [phone]  Should there be progression of current deficits over time, Ms. Spadafore is unlikely to regain any independent living skills lost. Therefore, it is recommended that she remain as involved as possible in all aspects of household chores, finances, and medication management, with supervision to ensure adequate performance. She will likely benefit from the establishment and maintenance of a routine in order to maximize her functional abilities over time.  It will be important for Ms. Shanes to have another person with her when in situations where she may need to process information, weigh the pros and cons of different options, and make decisions, in order to ensure that she fully understands and recalls all information to be considered.  If not already done, Ms. Sheldon and her family may want to discuss her wishes regarding durable power of attorney and medical decision making, so that she can have input into these choices. If they require legal assistance with this, long-term care resource access, or other aspects of estate planning, they could reach out to [employer] at [phone] for a free consultation.   Ms. Arzt is encouraged to attend to lifestyle factors for brain health (e.g., regular physical  exercise, good nutrition habits and consideration of the MIND-DASH diet, regular participation in cognitively-stimulating activities, and general stress management techniques), which are likely to have benefits for both emotional adjustment and cognition. Optimal control of vascular risk factors (including safe cardiovascular exercise and adherence to dietary recommendations) is encouraged. Continued participation  in activities which provide mental stimulation and social interaction is also recommended.   Important information should be provided to Ms. Leyman in written format in all instances. This information should be placed in a highly frequented and easily visible location within her home to promote recall. External strategies such as written notes in a consistently used memory journal, visual and nonverbal auditory cues such as a calendar on the refrigerator or appointments with alarm, such as on a cell phone, can also help maximize recall.  Memory can be improved using internal strategies such as rehearsal, repetition, chunking, mnemonics, association, and imagery. External strategies such as written notes in a consistently used memory journal, visual and nonverbal auditory cues such as a calendar on the refrigerator or appointments with alarm, such as on a cell phone, can also help maximize recall.    To address problems with fluctuating attention and/or executive dysfunction, she may wish to consider:   -Avoiding external distractions when needing to concentrate   -Limiting exposure to fast paced environments with multiple sensory demands   -Writing down complicated information and using checklists   -Attempting and completing one task at a time (i.e., no multi-tasking)   -Verbalizing aloud each step of a task to maintain focus   -Taking frequent breaks during the completion of steps/tasks to avoid fatigue   -Reducing the amount of information considered at one time   -Scheduling more  difficult activities for a time of day where she is usually most alert  Review of Records:   Ms. Mester was seen in the ED on 06/21/2022 following an unwitnessed fall in the bedroom, possibly due to a syncopal episode. EMS at the scene noted that she did not initially follow commands or respond to questions asked but did become more alert during transport. She impacted her right forehead and reported some amnesia surrounding the fall itself. Records suggest a history of mechanical falls in the past, as well as episodes of nausea and confusion which last about 15-20 seconds. These episodes were described being present for the past several years and did precipitate this fall per records. Urinalysis suggested dehydration at the time of her fall. Head CT was negative. She was sent home that night.  She met with [facility] [place], [place]) on 07/16/2022 for an evaluation of memory loss. At that time, Ms. Mcgrann denied all memory concerns or other cognitive difficulties. Her husband did express some concern surrounding short-term memory decline and increased repetition in day-to-day conversation, ongoing for the past 1-1.5 years. Concerns for anorexia were noted, with Ms. Dennler suggesting that she has "lost my ability to be hungry" and has been consuming around 600 calories per day. ADL dysfunction was denied. Performance on a brief cognitive screening instrument (MOCA) was 24/30. Ultimately, Ms. Booe was referred for a comprehensive neuropsychological evaluation to characterize her cognitive abilities and to assist with diagnostic clarity and treatment planning.   Ms. Petruso was recently seen in the ED on 10/03/2023 with onset of tremors. At that time, she described intermittent tremors largely involving her arms (and occasionally her legs as well) during the last few days. She also described symptoms as muscle spasms where she does not have control of her limbs. She noted that symptoms seemed  worse when she gets up to take a shower. Symptoms were said to last up to an hour and then dissipate on their own. She denied any loss of consciousness during the episodes. She was given Ativan in the hospital and symptoms  subsided. Concerns for a neurologic etiology for tremors were low per her ED physicians. Primary differential diagnoses included electrolyte abnormality, dehydration, anxiety, and/or medication side effect.   Neuroimaging Head CT on 06/21/2022 in the context of her fall was negative for any acute intracranial abnormality. It did suggest mild to moderate cortical atrophy and mild microvascular ischemia. EEG on 07/19/2022 was normal. Head CT on 10/03/2022 in the context of tremors was negative for any intracranial abnormality. It did suggest generalized cerebral atrophy of unspecified severity.  Past Medical History:  Diagnosis Date   Abdominal pain 08/08/2020   Acute nonsuppurative otitis media of left ear 12/22/2014   Allergy    Anorexia 01/14/2021   600 calories/daily   Asthma exacerbation 02/22/2012   Broken jaw    1980s; fall down flight of stairs   Callus of foot 12/28/2017   Coccydynia 04/08/2020   Concussion 06/2022   Fall at home    Conjunctivitis 04/04/2022   Fall 03/27/2017   At work on November 11, 2016, following with workman's comp  Last Assessment & Plan:   Larey Seat at work on 11/09/16 and has been following with [employer]. She   fell up some steps onto her knees and has been left with chronic pain.     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   Palpitation 03/24/2017   Rosacea 03/17/2020   Sarcoidosis    Sinusitis 01/18/2011   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   Vitamin B 12 deficiency 06/23/2022   Weight loss 11/07/2013    Past Surgical History:  Procedure Laterality Date   ABDOMINAL HYSTERECTOMY     CESAREAN SECTION     ECTOPIC PREGNANCY SURGERY  1981   FRACTURE SURGERY  1980s   broken jaw    Current Outpatient Medications:    albuterol (VENTOLIN HFA) 108 (90 Base) MCG/ACT inhaler, INHALE 2 PUFFS INTO THE LUNGS EVERY 6 HOURS AS NEEDED FOR WHEEZING, Disp: 18 g, Rfl: 5   albuterol (VENTOLIN HFA) 108 (90 Base) MCG/ACT inhaler, Inhale 1-2 puffs into the lungs every 4 (four) hours as needed for wheezing or shortness of breath., Disp: 1 each, Rfl: 5   ALPRAZolam (XANAX) 0.25 MG tablet, Take 0.25 mg by mouth daily. , Disp: , Rfl:    azelastine (ASTELIN) 0.1 % nasal spray, Place 2 sprays into both nostrils 2 (two) times daily. Use in each nostril as directed, Disp: 30 mL, Rfl: 12   buPROPion (WELLBUTRIN XL) 300 MG 24 hr tablet, Take 300 mg by mouth daily., Disp: , Rfl:    busPIRone (BUSPAR) 5 MG tablet, Take 5 mg by mouth 3 (three) times daily. , Disp: , Rfl:    cholestyramine (QUESTRAN) 4 g packet, Take 1 packet (4 g total) by mouth 2 (two) times daily as needed., Disp: 60 each, Rfl: 3   donepezil (ARICEPT) 10 MG tablet, Take half tablet (5 mg) daily for 2 weeks, then increase to the full tablet at 10 mg daily., Disp: 30 tablet, Rfl: 11   estradiol (ESTRACE)  1 MG tablet, TAKE 1 TABLET BY MOUTH DAILY, Disp: 90 tablet, Rfl: 1   fenofibrate micronized (LOFIBRA) 134 MG capsule, TAKE ONE CAPSULE BY MOUTH EVERY MORNING BEFORE BREAKFAST, Disp: 90 capsule, Rfl: 0   fluticasone-salmeterol (ADVAIR DISKUS) 250-50 MCG/ACT AEPB, Inhale 1 puff into the lungs in the morning and at bedtime., Disp: 60 each, Rfl: 5   losartan (COZAAR) 50 MG tablet, TAKE 1 TABLET BY MOUTH  DAILY, Disp: 90 tablet, Rfl: 1   metoprolol succinate (TOPROL-XL) 50 MG 24 hr tablet, TAKE 1 TABLET BY MOUTH DAILY WITH A MEAL, Disp: 90 tablet, Rfl: 1   neomycin-polymyxin-hydrocortisone (CORTISPORIN) 3.5-10000-1 ophthalmic suspension, Place 3 drops into both eyes 3 (three) times daily., Disp: 7.5 mL, Rfl: 0   Peppermint Oil (IBGARD) 90 MG CPCR, Take 2 capsules by mouth 2 (two) times daily as needed., Disp: 120 capsule, Rfl: 5   progesterone (PROMETRIUM) 100 MG capsule, TAKE ONE CAPSULE BY MOUTH DAILY, Disp: 90 capsule, Rfl: 1   progesterone (PROMETRIUM) 100 MG capsule, TAKE 1 CAPSULE BY MOUTH DAILY, Disp: 90 capsule, Rfl: 1   saccharomyces boulardii (FLORASTOR) 250 MG capsule, Take 250 mg by mouth daily., Disp: , Rfl:   Clinical Interview:   The following information was obtained during a clinical interview with Ms. Mahowald and her husband prior to cognitive testing.  Cognitive Symptoms: Decreased short-term memory: Denied. At most, Ms. Pageau described occasional instances where it might take a few seconds for her to think of a word to use or a prior memory. She otherwise denied all cognitive concerns. Her husband did express his perception of greater memory dysfunction. However, he generally did not provide any specific examples outside of occasional misplacing of items or being disoriented to the date and time.  Decreased long-term memory: Denied. Decreased attention/concentration: Denied. Reduced processing speed: Denied. Difficulties with executive functions: Denied. She also denied trouble with impulsivity or any significant personality changes.  Difficulties with emotion regulation: Denied. Difficulties with receptive language: Denied. Difficulties with word finding: Denied. Decreased visuoperceptual ability: Denied.  Difficulties completing ADLs: Ms. Dishon denied all concerns. Over the past 2-3 weeks, her husband did state that he has taken a more far involved role in medication  management due to what he described as quite notable adherence concerns. Medication adherence has been improved since he began managing medications and providing prompts for Ms. Preddy to take said medications consistently. Ms. Menning denied trouble with financial management or bill paying. She stated that her husband has expressed driving concerns since the onset of intermittent tremors. She denied any prior navigation or safety concerns.   Additional Medical History: History of traumatic brain injury/concussion: She and her husband described a likely concussion sustained in May 2024 (described above). Her husband noted a brief loss in consciousness lasting a few minutes, as well as Ms. Silcott seeming dazed and disoriented afterwards. She noted some brief amnesia surrounding the fall itself. As stated above, a head CT in the ED was negative. More remotely, she described falling down a flight of stairs, ultimately hitting the concrete floor and breaking her jaw in the 1980s. Broadly, she described a history of numerous falls due to a baseline degree of clumsiness that has been relatively stable over time.  History of stroke: Denied. History of seizure activity: Denied. History of known exposure to toxins: Denied. Symptoms of chronic pain: Denied. Experience of frequent headaches/migraines: Denied. Her husband described some very brief 10-15 second spells where Ms. Horgen has described sharp, stabbing frontal pain. She  did not recall these experiences as he was describing them. Frequent instances of dizziness/vertigo: Denied. Her husband did describe some intermittent dizziness associated with balance in general.   Sensory changes: She wears glasses with benefit. Other sensory changes/difficulties (e.g., hearing, taste, smell) were denied.  Balance/coordination difficulties: Endorsed. As stated above, she described a longstanding baseline level of clumsiness, calling herself a long-time "Laurence Spates."  Outside of her May 2024 fall described above, no other recent falls were said to produce significant injury. She described generalized weakness in her extremities as a likely cause for balance instability.  Other motor difficulties: Intermittent tremors have been described above. There have largely been concerns surrounding non-neurological causes for said experiences. No consistent tremulous experiences were described.   Sleep History: Estimated hours obtained each night: 8 hours.  Difficulties falling asleep: Denied. Difficulties staying asleep: Denied. Feels rested and refreshed upon awakening: Endorsed.  History of snoring: Denied. History of waking up gasping for air: Denied. Witnessed breath cessation while asleep: Denied.  History of vivid dreaming: Denied. Excessive movement while asleep: Denied. Instances of acting out her dreams: Denied.  Psychiatric/Behavioral Health History: Depression: She described her current mood as "mellow lately," noting that this was a change for her normally high-strung personality. Her husband was in agreement. When asked directly, she did not describe any prior mental health concerns or diagnoses surrounding a depressive condition. Current or remote suicidal ideation, intent, or plan was denied.  Anxiety: She did report a history of generalized anxious distress. She meets with a provider semi-regularly who prescribes medication. She stated that said medication has been helpful in managing symptoms.  Mania: Denied. Trauma History: Denied. Visual/auditory hallucinations: Denied. Delusional thoughts: Denied.  Tobacco: Denied. Alcohol: Ms. Buri appeared quite reluctant to divulge overall alcohol intake during interview. With multiple rounds of direct questioning, she ultimately stated that she consumes several glasses of wine "a lot of nights" throughout the week. She denied a history of problematic alcohol abuse or dependence.  Recreational drugs:  Denied.  Family History: Problem Relation Age of Onset   Hypertension Mother    Dementia Mother        multi-infarct   Diabetes Mother    Diabetes Father    Heart attack Father    Heart disease Father    Psoriasis Father    Cancer Sister        pancreatic   Alcohol abuse Sister        substance   Hepatitis B Brother    Gout Brother    Hypertension Brother    Glaucoma Brother    Heart disease Brother    Rashes / Skin problems Son    Other Son        bad allergies, aspirin, dermatitis   Rashes / Skin problems Son    Cancer Maternal Aunt        breast   Breast cancer Maternal Aunt    This information was confirmed by Ms. Marca Ancona.  Academic/Vocational History: Highest level of educational attainment: 18 years. She earned a Oncologist in [place], as well as two Master's degrees in education and mass communication. She described herself as a good (A/B) student throughout academic settings. Higher level math courses may have represented a relative weakness.  History of developmental delay: Denied. History of grade repetition: Denied. Enrollment in special education courses: Denied. History of LD/ADHD: Denied.  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   Evaluation Results:   Behavioral Observations: Ms. Otey was accompanied by her husband, arrived to her appointment on time, and was appropriately dressed and groomed. She appeared alert. She ambulated slowly and with a good degree of caution, ultimately leaning on her husband for added support. Gross motor functioning appeared intact upon informal observation and no abnormal movements (e.g., tremors) were noted. Her affect was generally positive, but did range appropriately given the subject being discussed during the clinical interview or the task at hand during testing procedures. At one point, she expressed  "resistance" to the testing procedures. This had been observed prior to her acknowledgement. She also appeared anxious about the testing process itself. Spontaneous speech was fluent and word finding difficulties were not observed during the clinical interview. Thought processes were coherent, organized, and normal in content. Insight into her cognitive difficulties appeared poor. However, it remained unclear if Ms. Mustafa was actively minimizing concerns and attempting to portray herself in a favorable light or if she does not have full appreciation for the extent of memory impairment and other dysfunction.   During testing, Ms. Markowicz required instruction repetition and/or added clarification across essentially all administered tasks. She appeared somewhat defensive during testing, claming various tasks were "childish" and over-emphasizing her intelligence and past successes in life to the psychometrist in between tasks. Sustained attention was appropriate. Task engagement was adequate and she persisted when challenged. When asked to complete a questionnaire about her mood during the past 1-2 weeks, she initially filled this out incorrectly after stating that she was interpreting the quality and wording of the question and not how it pertained to her or her recent experiences. Overall, Ms. Wiesner was cooperative with the clinical interview and subsequent testing procedures.   Adequacy of Effort: The validity of neuropsychological testing is limited by the extent to which the individual being tested may be assumed to have exerted adequate effort during testing. Ms. Celona expressed her intention to perform to the best of her abilities and exhibited adequate task engagement and persistence. Scores across stand-alone and embedded performance validity measures were variable but largely within normal limits. Her sole below expectation performance was believed to be due to her not placing an emphasis on speed  and approaching said task in a more slow and deliberate fashion. Overall, while some mild caution with interpretation may be prudent, I feel that the results of the current evaluation represent a reasonably valid representation of Ms. Easom's current cognitive functioning.  Test Results: Ms. Paap was largely oriented at the time of the current evaluation. She was one day off when stating the date and unable to correctly name the current clinic.  Intellectual abilities based upon educational and vocational attainment were estimated to be in the average to above average range. Premorbid abilities were estimated to be within the above average range based upon a single-word reading test.   Processing speed was variable, ranging from the exceptionally low to average normative ranges. Basic attention was average. More complex attention (e.g., working memory) was also average. Executive functioning was variable, ranging from the exceptionally low to above average normative ranges. She performed in the above average range across a task assessing safety and judgment.  While not directly assessed, receptive language abilities were believed to be intact. Ms. Illes did not exhibit significant difficulties comprehending task instructions (outside of commonly requiring them to be repeated) and answered all questions asked of her appropriately. Assessed expressive language (e.g., verbal fluency and confrontation naming) was  below average to average.     Assessed visuospatial/visuoconstructional abilities were average to above average. Points were lost on her drawing of a clock due to incorrect hand placement.    Learning (i.e., encoding) of novel information was average across a single visual task but well below average across all three verbal tasks. Spontaneous delayed recall (i.e., retrieval) of previously learned information was below average across a visual task and exceptionally low across all verbal  tasks. Retention rates were 0% across a story learning task, 0% across a list learning task, 67% across a daily living task, and 67% across a shape learning task. Performance across recognition tasks was variable, ranging from the well below average to average normative ranges, suggesting some evidence for information consolidation.   Results of emotional screening instruments suggested that recent symptoms of generalized anxiety were in the minimal range, while symptoms of depression were within normal limits. A screening instrument assessing recent sleep quality suggested the presence of minimal sleep dysfunction.  Tables of Scores:   Note: This summary of test scores accompanies the interpretive report and should not be considered in isolation without reference to the appropriate sections in the text. Descriptors are based on appropriate normative data and may be adjusted based on clinical judgment. Terms such as "Within Normal Limits" and "Outside Normal Limits" are used when a more specific description of the test score cannot be determined.       Percentile - Normative Descriptor > 98 - Exceptionally High 91-97 - Well Above Average 75-90 - Above Average 25-74 - Average 9-24 - Below Average 2-8 - Well Below Average < 2 - Exceptionally Low       Validity:   DESCRIPTOR       DCT: --- --- Outside Normal Limits  NAB EVI: --- --- Within Normal Limits  D-KEFS Color Word EI: --- --- Within Normal Limits       Orientation:      Raw Score Percentile   NAB Orientation, Form 1 27/29 --- ---       Cognitive Screening:      Raw Score Percentile   SLUMS: 22/30 --- ---       Intellectual Functioning:      Standard Score Percentile   Test of Premorbid Functioning: 116 86 Above Average       Memory:     NAB Memory Module, Form 1: Standard Score/ T Score Percentile   Total Memory Index 65 1 Exceptionally Low  List Learning       Total Trials 1-3 15/36 (30) 2 Well Below Average    List B  1/12 (24) <1 Exceptionally Low    Short Delay Free Recall 4/12 (30) 2 Well Below Average    Long Delay Free Recall 0/12 (20) <1 Exceptionally Low    Retention Percentage 0 (22) <1 Exceptionally Low    Recognition Discriminability 7 (51) 54 Average  Shape Learning       Total Trials 1-3 18/27 (56) 73 Average    Delayed Recall 4/9 (38) 12 Below Average    Retention Percentage 67 (40) 16 Below Average    Recognition Discriminability 7 (52) 58 Average  Story Learning       Immediate Recall 51/80 (36) 8 Well Below Average    Delayed Recall 0/40 (24) <1 Exceptionally Low    Retention Percentage 0 (12) <1 Exceptionally Low  Daily Living Memory       Immediate Recall 29/51 (29) 2 Well Below Average  Delayed Recall 8/17 (25) 1 Exceptionally Low    Retention Percentage 67 (38) 12 Below Average    Recognition Hits 6/10 (28) 2 Well Below Average       Attention/Executive Function:     Trail Making Test (TMT): Raw Score (T Score) Percentile     Part A 31 secs.,  0 errors (50) 50 Average    Part B 193 secs.,  1 error (29) 2 Exceptionally Low         Scaled Score Percentile   WAIS-IV Coding: 4 2 Well Below Average       NAB Attention Module, Form 1: T Score Percentile     Digits Forward 50 50 Average    Digits Backwards 45 31 Average        Scaled Score Percentile   WAIS-IV Similarities: 13 84 Above Average       D-KEFS Color-Word Interference Test: Raw Score (Scaled Score) Percentile     Color Naming 54 secs. (2) <1 Exceptionally Low    Word Reading 27 secs. (9) 37 Average    Inhibition 89 secs. (7) 16 Below Average      Total Errors 0 errors (13) 84 Above Average    Inhibition/Switching 181 secs. (1) <1 Exceptionally Low      Total Errors 1 error (12) 75 Above Average       D-KEFS Verbal Fluency Test: Raw Score (Scaled Score) Percentile     Letter Total Correct 36 (10) 50 Average    Category Total Correct 32 (10) 50 Average    Category Switching Total Correct 6 (3) 1  Exceptionally Low    Category Switching Accuracy 2 (1) <1 Exceptionally Low      Total Set Loss Errors 1 (11) 63 Average      Total Repetition Errors 2 (11) 63 Average       NAB Executive Functions Module, Form 1: T Score Percentile     Judgment 58 79 Above Average       Language:     Verbal Fluency Test: Raw Score (T Score) Percentile     Phonemic Fluency (FAS) 36 (41) 18 Below Average    Animal Fluency 20 (47) 38 Average        NAB Language Module, Form 1: T Score Percentile     Naming 31/31 (58) 79 Above Average       Visuospatial/Visuoconstruction:      Raw Score Percentile   Clock Drawing: 8/10 --- Within Normal Limits       NAB Spatial Module, Form 1: T Score Percentile     Figure Drawing Copy 57 75 Above Average        Scaled Score Percentile   WAIS-IV Block Design: 8 25 Average       Mood and Personality:      Raw Score Percentile   Beck Depression Inventory - II: 2 --- Within Normal Limits  PROMIS Anxiety Questionnaire: 11 --- None to Slight       Additional Questionnaires:      Raw Score Percentile   PROMIS Sleep Disturbance Questionnaire: 9 --- None to Slight   Informed Consent and Coding/Compliance:   The current evaluation represents a clinical evaluation for the purposes previously outlined by the referral source and is in no way reflective of a forensic evaluation.   Ms. Magruder was provided with a verbal description of the nature and purpose of the present neuropsychological evaluation. Also reviewed were the foreseeable risks and/or discomforts and benefits of  the procedure, limits of confidentiality, and mandatory reporting requirements of this provider. The patient was given the opportunity to ask questions and receive answers about the evaluation. Oral consent to participate was provided by the patient.   This evaluation was conducted by Newman Nickels, Ph.D., ABPP-CN, board certified clinical neuropsychologist. Ms. Flum completed a clinical interview  with Dr. Milbert Coulter, billed as one unit [phone], and 155 minutes of cognitive testing and scoring, billed as one unit [phone] and four additional units [id number]. Psychometrist Shan Levans, B.S. assisted Dr. Milbert Coulter with test administration and scoring procedures. As a separate and discrete service, one unit [id number] and two units [phone] were billed for Dr. Tammy Sours time spent in interpretation and report writing.

## 2022-11-10 DIAGNOSIS — F331 Major depressive disorder, recurrent, moderate: Secondary | ICD-10-CM | POA: Diagnosis not present

## 2022-11-10 DIAGNOSIS — F411 Generalized anxiety disorder: Secondary | ICD-10-CM | POA: Diagnosis not present

## 2022-11-11 ENCOUNTER — Encounter: Payer: Self-pay | Admitting: Physician Assistant

## 2022-11-14 ENCOUNTER — Ambulatory Visit
Admission: RE | Admit: 2022-11-14 | Discharge: 2022-11-14 | Disposition: A | Discharge status: Home / Self Care | Payer: Medicare PPO | Source: Ambulatory Visit | Attending: Physician Assistant | Admitting: Physician Assistant

## 2022-11-14 DIAGNOSIS — R413 Other amnesia: Secondary | ICD-10-CM | POA: Diagnosis not present

## 2022-11-18 ENCOUNTER — Ambulatory Visit: Payer: Medicare PPO | Admitting: Psychology

## 2022-11-18 DIAGNOSIS — R630 Anorexia: Secondary | ICD-10-CM | POA: Diagnosis not present

## 2022-11-18 DIAGNOSIS — G3184 Mild cognitive impairment, so stated: Secondary | ICD-10-CM

## 2022-11-18 DIAGNOSIS — S060X1S Concussion with loss of consciousness of 30 minutes or less, sequela: Secondary | ICD-10-CM | POA: Diagnosis not present

## 2022-11-18 NOTE — Progress Notes (Signed)
   Neuropsychology Feedback Session Casey Rowe. [facility] Department of Neurology  Reason for Referral:   Casey Rowe is a 72 y.o. right-handed Caucasian female referred by Casey Kays, PA-C, to characterize her current cognitive functioning and assist with diagnostic clarity and treatment planning in the context of subjective cognitive decline.   Feedback:   Casey Rowe completed a comprehensive neuropsychological evaluation on 11/02/2022. Please refer to that encounter for the full report and recommendations. Briefly, results suggested significant impairment surrounding essentially all aspects of verbal learning and memory. Further performance variability/weakness was exhibited across processing speed and executive functioning. The etiology for her ongoing cognitive impairment is unclear. Medical records suggest concerns for anorexia, including Casey Rowe recently describing to her neurologist that she typically consumes a low caloric diet of 600 calories daily. Past research on individuals with anorexia has detailed noteworthy structural changes where neuroimaging (i.e., brain MRIs) suggest gray and white matter reduction correlating with the extent of malnourishment. From a cognitive perspective, there is a long and well established correlation between malnutrition and diminished cognitive abilities. Primary affected domains commonly include processing speed, attention/concentration, executive functioning, visuospatial/constructional abilities, and aspects of both verbal and visual memory. Her patterns of weakness across the current evaluation do align with expectations fairly well. While she has not yet had a brain MRI completed (this is currently scheduled for 11/14/2022), a prior head CT in 2024 revealed mild to moderate generalized cortical atrophy, also aligning reasonably well with what can be seen in this presentation. These difficulties could be exacerbated by the  potential for elevated alcohol use/abuse. Overall, this remains a plausible explanation for ongoing dysfunction and adequate medical monitoring will be important moving forward. Neurologically speaking, I am unable to rule out the early stages of an underlying neurodegenerative illness at the present time. Across verbal memory tasks, Casey Rowe did not benefit from repeated exposure to information. After a brief delay, she was fully amnestic (i.e., 0% retention) across 2/3 tasks and performed very poorly across 2/3 yes/no recognition trials. Taken together, this suggests growing concern for rapid forgetting and an evolving storage impairment, both of which are hallmark testing patterns of Alzheimer's disease. It is encouraging that expressive language, visuospatial performances, and visual memory were normatively appropriate. If this illness is present, it would appear to be in early stages. However, while I cannot rule out this illness, it also should not be ruled in with confidence given the impact of potential malnourishment as described above.   Casey Rowe was accompanied by her husband during the current feedback session. Content of the current session focused on the results of her neuropsychological evaluation. Casey Rowe was given the opportunity to ask questions and her questions were answered. She was encouraged to reach out should additional questions arise. A copy of her report was provided at the conclusion of the visit.      One unit [phone] was billed for Dr. Tammy Sours time spent preparing for, conducting, and documenting the current feedback session with Casey Rowe.

## 2022-11-29 DIAGNOSIS — L820 Inflamed seborrheic keratosis: Secondary | ICD-10-CM | POA: Diagnosis not present

## 2022-12-09 ENCOUNTER — Other Ambulatory Visit: Payer: Self-pay | Admitting: Family Medicine

## 2022-12-23 DIAGNOSIS — F331 Major depressive disorder, recurrent, moderate: Secondary | ICD-10-CM | POA: Diagnosis not present

## 2022-12-23 DIAGNOSIS — F411 Generalized anxiety disorder: Secondary | ICD-10-CM | POA: Diagnosis not present

## 2023-01-04 ENCOUNTER — Ambulatory Visit: Payer: Medicare PPO | Admitting: Family Medicine

## 2023-01-04 VITALS — BP 120/69 | HR 62 | Ht 64.0 in | Wt 116.0 lb

## 2023-01-04 DIAGNOSIS — I10 Essential (primary) hypertension: Secondary | ICD-10-CM | POA: Diagnosis not present

## 2023-01-04 DIAGNOSIS — M79671 Pain in right foot: Secondary | ICD-10-CM

## 2023-01-04 DIAGNOSIS — M79672 Pain in left foot: Secondary | ICD-10-CM

## 2023-01-04 DIAGNOSIS — E538 Deficiency of other specified B group vitamins: Secondary | ICD-10-CM

## 2023-01-04 DIAGNOSIS — G3184 Mild cognitive impairment, so stated: Secondary | ICD-10-CM | POA: Diagnosis not present

## 2023-01-04 DIAGNOSIS — E785 Hyperlipidemia, unspecified: Secondary | ICD-10-CM

## 2023-01-04 LAB — COMPREHENSIVE METABOLIC PANEL
ALT: 10 U/L (ref 0–35)
AST: 14 U/L (ref 0–37)
Albumin: 4 g/dL (ref 3.5–5.2)
Alkaline Phosphatase: 31 U/L — ABNORMAL LOW (ref 39–117)
BUN: 12 mg/dL (ref 6–23)
CO2: 30 meq/L (ref 19–32)
Calcium: 9.4 mg/dL (ref 8.4–10.5)
Chloride: 103 meq/L (ref 96–112)
Creatinine, Ser: 0.9 mg/dL (ref 0.40–1.20)
GFR: 64.85 mL/min (ref >60.00)
Glucose, Bld: 77 mg/dL (ref 70–99)
Potassium: 4.8 meq/L (ref 3.5–5.1)
Sodium: 138 meq/L (ref 135–145)
Total Bilirubin: 0.6 mg/dL (ref 0.2–1.2)
Total Protein: 6.4 g/dL (ref 6.0–8.3)

## 2023-01-04 LAB — CBC WITH DIFFERENTIAL/PLATELET
Basophils Absolute: 0.1 10*3/uL (ref 0.0–0.1)
Basophils Relative: 1.6 % (ref 0.0–3.0)
Eosinophils Absolute: 0.5 10*3/uL (ref 0.0–0.7)
Eosinophils Relative: 8.5 % — ABNORMAL HIGH (ref 0.0–5.0)
HCT: 38.1 % (ref 36.0–46.0)
Hemoglobin: 12.6 g/dL (ref 12.0–15.0)
Lymphocytes Relative: 16.4 % (ref 12.0–46.0)
Lymphs Abs: 1 10*3/uL (ref 0.7–4.0)
MCHC: 33 g/dL (ref 30.0–36.0)
MCV: 101.4 fL — ABNORMAL HIGH (ref 78.0–100.0)
Monocytes Absolute: 0.7 10*3/uL (ref 0.1–1.0)
Monocytes Relative: 11.4 % (ref 3.0–12.0)
Neutro Abs: 3.6 10*3/uL (ref 1.4–7.7)
Neutrophils Relative %: 62.1 % (ref 43.0–77.0)
Platelets: 226 10*3/uL (ref 150.0–400.0)
RBC: 3.76 Mil/uL — ABNORMAL LOW (ref 3.87–5.11)
RDW: 12.4 % (ref 11.5–15.5)
WBC: 5.9 10*3/uL (ref 4.0–10.5)

## 2023-01-04 LAB — LIPID PANEL
Cholesterol: 142 mg/dL (ref 0–200)
HDL: 50.5 mg/dL (ref >39.00)
LDL Cholesterol: 66 mg/dL (ref 0–99)
NonHDL: 91.97
Total CHOL/HDL Ratio: 3
Triglycerides: 131 mg/dL (ref 0.0–149.0)
VLDL: 26.2 mg/dL (ref 0.0–40.0)

## 2023-01-04 LAB — B12 AND FOLATE PANEL
Folate: 15.8 ng/mL (ref >5.9)
Vitamin B-12: 347 pg/mL (ref 211–911)

## 2023-01-04 LAB — TSH: TSH: 0.81 u[IU]/mL (ref 0.35–5.50)

## 2023-01-04 NOTE — Progress Notes (Signed)
Established Patient Office Visit  Subjective   Patient ID: Casey Rowe, female    DOB: Jan 25, 1951  Age: 72 y.o. MRN: [id number]  Chief Complaint  Patient presents with   Medical Management of Chronic Issues    HPI   Discussed the use of AI scribe software for clinical note transcription with the patient, who gave verbal consent to proceed.  History of Present Illness   The patient presents for routine follow-up. 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 Despite taking gabapentin three times a day, the patient continues to experience pain and has to walk slowly due to the discomfort. She is following with podiatry for this. She also reports difficulty getting up after a fall, attributing this to weakness.  The patient has been using occasional albuterol for breathing issues, which she attributes to allergy season. She denies daily use of Xanax and is unsure about the use of Buspar. She is currently weaning off estradiol and progesterone.   The patient has been experiencing memory problems and has been evaluated by a neurologist. She reports a history of falls, including a significant fall on ice many years ago and a more recent fall at home resulting in a concussion. She expresses frustration that these incidents are being linked to her current cognitive issues. She was prescribed donepezil by the neurologist, but she experienced severe vomiting and has discontinued the medication. She was encouraged to follow-up with neuropsych in one year.  The patient acknowledges a lack of appetite and the need to make a conscious decision to eat. She reports eating a balanced diet with assistance from husband. Despite the neuropathy, she is trying to stay active by walking. She is currently on losartan and metoprolol for blood pressure management. Reports this has been working well.             ROS All  review of systems negative except what is listed in the HPI    Objective:     BP 120/69   Pulse 62   Ht 5\' 4"  (1.626 m)   Wt 116 lb (52.6 kg)   LMP 02/08/1997   SpO2 99%   BMI 19.91 kg/m    Physical Exam Vitals reviewed.  Constitutional:      Appearance: Normal appearance.  Cardiovascular:     Rate and Rhythm: Normal rate and regular rhythm.  Pulmonary:     Effort: Pulmonary effort is normal.     Breath sounds: Normal breath sounds.  Musculoskeletal:     Cervical back: Normal range of motion and neck supple. No tenderness.  Lymphadenopathy:     Cervical: No cervical adenopathy.  Skin:    General: Skin is warm and dry.  Neurological:     Mental Status: She is alert.     Cranial Nerves: No cranial nerve deficit.     Motor: No weakness.     Gait: Gait normal.  Psychiatric:        Mood and Affect: Mood normal.        Behavior: Behavior normal.        Thought Content: Thought content normal.      No results found for any visits on 01/04/23.    The 10-year ASCVD risk score (Arnett DK, et al., 2019) is: 10.1%    Assessment & Plan:   Problem List Items Addressed This Visit       Active Problems   Hypertension - Primary  Stable Continue current regimen and heart healhty diet, exercise as tolerated      Relevant Orders   CBC with Differential/Platelet   Comprehensive metabolic panel   Lipid panel   TSH   Hyperlipidemia    Stable. Continue current regimen Lifestyle factors for lowering cholesterol include: Diet therapy - heart-healthy diet rich in fruits, veggies, fiber-rich whole grains, lean meats, chicken, fish (at least twice a week), fat-free or 1% dairy products; foods low in saturated/trans fats, cholesterol, sodium, and sugar. Mediterranean diet has shown to be very heart healthy. Regular exercise - recommend at least 30 minutes a day, 5 times per week Weight management        Relevant Orders   Comprehensive metabolic panel   Lipid panel    Vitamin B 12 deficiency    Supplement and monitor.       Relevant Orders   CBC with Differential/Platelet   B12 and Folate Panel   Mild cognitive impairment with memory loss    Did not tolerate Aricept Supportive measures, exercise, healthy diet (increase calorie intake recommended) Due to follow-up with neuropsych in 1 year      Other Visit Diagnoses     Bilateral foot pain     Following with podiatry. 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    Clayborne Dana, NP

## 2023-01-04 NOTE — Assessment & Plan Note (Signed)
Stable Continue current regimen and heart healhty diet, exercise as tolerated

## 2023-01-04 NOTE — Assessment & Plan Note (Signed)
Stable. Continue current regimen Lifestyle factors for lowering cholesterol include: Diet therapy - heart-healthy diet rich in fruits, veggies, fiber-rich whole grains, lean meats, chicken, fish (at least twice a week), fat-free or 1% dairy products; foods low in saturated/trans fats, cholesterol, sodium, and sugar. Mediterranean diet has shown to be very heart healthy. Regular exercise - recommend at least 30 minutes a day, 5 times per week Weight management

## 2023-01-04 NOTE — Assessment & Plan Note (Signed)
Supplement and monitor 

## 2023-01-04 NOTE — Assessment & Plan Note (Signed)
Did not tolerate Aricept Supportive measures, exercise, healthy diet (increase calorie intake recommended) Due to follow-up with neuropsych in 1 year

## 2023-01-15 ENCOUNTER — Other Ambulatory Visit: Payer: Self-pay | Admitting: Obstetrics & Gynecology

## 2023-01-15 DIAGNOSIS — N951 Menopausal and female climacteric states: Secondary | ICD-10-CM

## 2023-01-19 DIAGNOSIS — F411 Generalized anxiety disorder: Secondary | ICD-10-CM | POA: Diagnosis not present

## 2023-01-19 DIAGNOSIS — F331 Major depressive disorder, recurrent, moderate: Secondary | ICD-10-CM | POA: Diagnosis not present

## 2023-01-25 ENCOUNTER — Ambulatory Visit: Payer: Medicare PPO | Admitting: Physician Assistant

## 2023-01-26 ENCOUNTER — Ambulatory Visit: Payer: Medicare PPO | Admitting: Physician Assistant

## 2023-02-04 ENCOUNTER — Encounter: Payer: Self-pay | Admitting: Family Medicine

## 2023-02-04 NOTE — Telephone Encounter (Signed)
Called and spoke with patient. A virtual visit appointment was made for Monday 02/07/2023

## 2023-02-04 NOTE — Telephone Encounter (Signed)
Called the patient to see if we can make an appointment. No answer. LVM

## 2023-02-06 NOTE — Assessment & Plan Note (Signed)
Monitor and report any concerns, no changes to meds. Encouraged heart healthy diet such as the DASH diet and exercise as tolerated.  ?

## 2023-02-06 NOTE — Assessment & Plan Note (Signed)
She has been struggling with URI symptoms. Encouraged increased rest and hydration, add probiotics, zinc such as Coldeze or Xicam. Treat fevers as needed

## 2023-02-07 ENCOUNTER — Encounter: Payer: Self-pay | Admitting: Family Medicine

## 2023-02-07 ENCOUNTER — Telehealth (INDEPENDENT_AMBULATORY_CARE_PROVIDER_SITE_OTHER): Payer: Medicare PPO | Admitting: Family Medicine

## 2023-02-07 VITALS — BP 149/85 | HR 65

## 2023-02-07 DIAGNOSIS — J329 Chronic sinusitis, unspecified: Secondary | ICD-10-CM

## 2023-02-07 DIAGNOSIS — I10 Essential (primary) hypertension: Secondary | ICD-10-CM | POA: Diagnosis not present

## 2023-02-07 MED ORDER — METHYLPREDNISOLONE 4 MG PO TBPK: ORAL_TABLET | ORAL | 0 refills | Status: DC

## 2023-02-07 MED ORDER — AZITHROMYCIN 250 MG PO TABS: ORAL_TABLET | ORAL | 0 refills | Status: AC

## 2023-02-07 MED ORDER — HYDROCODONE BIT-HOMATROP MBR 5-1.5 MG/5ML PO SOLN: 5.0000 mL | Freq: Two times a day (BID) | ORAL | 0 refills | Status: DC | PRN

## 2023-02-07 NOTE — Progress Notes (Signed)
MyChart Video Visit    Virtual Visit via Video Note   This patient is at least at moderate risk for complications without adequate follow up. This format is felt to be most appropriate for this patient at this time. Physical exam was limited by quality of the video and audio technology used for the visit. Juanetta, CMA was able to get the patient set up on a video visit.  Patient location: home Patient and provider in visit Provider location: Office  I discussed the limitations of evaluation and management by telemedicine and the availability of in person appointments. The patient expressed understanding and agreed to proceed.  Visit Date: 02/07/2023  Today's healthcare provider: Danise Edge, MD     Subjective:    Patient ID: Casey Rowe, female    DOB: [birth year], 72 y.o.   MRN: [id number]  No chief complaint on file.   HPI Discussed the use of AI scribe software for clinical note transcription with the patient, who gave verbal consent to proceed.  History of Present Illness   The patient, with a history of unexplained weight loss, presents with a persistent cough and laryngitis that has been ongoing for a month. The cough is severe enough to cause hoarseness and disrupt sleep, requiring the patient to take Robitussin both during the day and at night. However, the Robitussin has been causing diarrhea. The patient has not tried any other medications for these symptoms. The patient also mentions a family member who recently had a triple bypass surgery, which has added to her stress.        Past Medical History:  Diagnosis Date  . Abdominal pain 08/08/2020  . Acute nonsuppurative otitis media of left ear 12/22/2014  . Allergy   . Anorexia 01/14/2021   600 calories/daily  . Asthma exacerbation 02/22/2012  . Broken jaw    1980s; fall down flight of stairs  . Callus of foot 12/28/2017  . Coccydynia 04/08/2020  . Concussion 06/2022   Fall at home   .  Conjunctivitis 04/04/2022  . Fall 03/27/2017   At work on November 11, 2016, following with workman's comp  Last Assessment & Plan:   Larey Seat at work on 11/09/16 and has been following with [employer]. She   fell up some steps onto her knees and has been left with chronic pain.    Marland Kitchen [place], menopausal 03/17/2020  . Generalized anxiety disorder   . Hematoma 06/23/2022  . Hyperlipidemia 01/17/2020  . Hypertension   . Injury of plantar plate 46/96/2952  . Interdigital neuroma of foot 11/17/2017  . Irritable bowel syndrome with both constipation and diarrhea 01/14/2021  . Left hip pain 01/21/2020  . Loss of appetite 01/14/2021  . Macrocytic anemia 02/15/2022  . Metatarsalgia of both feet 12/28/2017  . Mild cognitive impairment with memory loss 11/02/2022  . Moderate persistent asthma without complication 06/11/2021  . Morton's neuroma of both feet 03/27/2017  . Palpitation 03/24/2017  . Rosacea 03/17/2020  . Sarcoidosis   . Sinusitis 01/18/2011  . 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  . Vitamin B 12 deficiency 06/23/2022  . Weight loss 11/07/2013    Past Surgical History:  Procedure Laterality Date  . ABDOMINAL HYSTERECTOMY    . CESAREAN SECTION    . ECTOPIC PREGNANCY SURGERY  1981  . FRACTURE SURGERY  1980s   broken jaw    Family History  Problem Relation Age of Onset  . Hypertension Mother   . Dementia Mother  multi-infarct  . Diabetes Mother   . Diabetes Father   . Heart attack Father   . Heart disease Father   . Psoriasis Father   . Cancer Sister        pancreatic  . Alcohol abuse Sister        substance  . Hepatitis B Brother   . Gout Brother   . Hypertension Brother   . Glaucoma Brother   . Heart disease Brother   . Rashes / Skin problems Son   . Other Son        bad allergies, aspirin, dermatitis  . Rashes / Skin problems Son   . Cancer Maternal Aunt        breast  . Breast cancer Maternal Aunt     Social History    Socioeconomic History  . Marital status: Married    Spouse name: Not on file  . Number of children: Not on file  . Years of education: 61  . Highest education level: Master's degree (e.g., MA, MS, MEng, MEd, MSW, MBA)  Occupational History  . Occupation: Retired  Tobacco Use  . Smoking status: Never  . Smokeless tobacco: Never  Vaping Use  . Vaping status: Never Used  Substance and Sexual Activity  . Alcohol use: Yes    Alcohol/week: 14.0 - 21.0 standard drinks of alcohol    Types: 14 - 21 Glasses of wine per week    Comment: couple glasses of wine "a lot of nights"  . Drug use: No  . Sexual activity: Yes  Other Topics Concern  . Not on file  Social History Narrative   Works as a Runner, 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MarriedGrown children- 3   Master in science    One floor home   Right handed   Lives with husband   Retired   Drinks caffeine prn   Social Drivers of Corporate investment banker Strain: Low Risk  (01/04/2023)   Overall Financial Resource Strain (CARDIA)   . Difficulty of Paying Living Expenses: Not hard at all  Food Insecurity: No Food Insecurity (01/04/2023)   Hunger Vital Sign   . Worried About Programme researcher, broadcasting/film/video in the Last Year: Never true   . Ran Out of Food in the Last Year: Never true  Transportation Needs: No Transportation Needs (01/04/2023)   PRAPARE - Transportation   . Lack of Transportation (Medical): No   . Lack of Transportation (Non-Medical): No  Physical Activity: Insufficiently Active (01/04/2023)   Exercise Vital Sign   . Days of Exercise per Week: 2 days   . Minutes of Exercise per Session: 30 min  Stress: Stress Concern Present (01/04/2023)   [employer] of Occupational Health - Occupational Stress Questionnaire   . Feeling of Stress : To some extent  Social Connections: Socially Isolated (01/04/2023)   Social Connection and Isolation Panel [NHANES]   . Frequency of Communication with Friends and Family: Once a week   . Frequency  of Social Gatherings with Friends and Family: Once a week   . Attends Religious Services: Never   . Active Member of Clubs or Organizations: No   . Attends Banker Meetings: Not on file   . Marital Status: Married  Catering manager Violence: Not on file    Outpatient Medications Prior to Visit  Medication Sig Dispense Refill  . albuterol (VENTOLIN HFA) 108 (90 Base) MCG/ACT inhaler Inhale 1-2 puffs into the lungs every 4 (four) hours as needed for wheezing or  shortness of breath. 1 each 5  . ALPRAZolam (XANAX) 0.25 MG tablet Take 0.25 mg by mouth daily.     Marland Kitchen azelastine (ASTELIN) 0.1 % nasal spray Place 2 sprays into both nostrils 2 (two) times daily. Use in each nostril as directed 30 mL 12  . buPROPion (WELLBUTRIN XL) 300 MG 24 hr tablet Take 300 mg by mouth daily.    . busPIRone (BUSPAR) 5 MG tablet Take 5 mg by mouth 3 (three) times daily.     . cholestyramine (QUESTRAN) 4 g packet Take 1 packet (4 g total) by mouth 2 (two) times daily as needed. 60 each 3  . estradiol (ESTRACE) 1 MG tablet TAKE 1 TABLET BY MOUTH DAILY 90 tablet 1  . fenofibrate micronized (LOFIBRA) 134 MG capsule TAKE ONE CAPSULE BY MOUTH EVERY MORNING BEFORE BREAKFAST 90 capsule 0  . fluticasone-salmeterol (ADVAIR DISKUS) 250-50 MCG/ACT AEPB Inhale 1 puff into the lungs in the morning and at bedtime. 60 each 5  . gabapentin (NEURONTIN) 300 MG capsule Take 300 mg by mouth 3 (three) times daily.    Marland Kitchen losartan (COZAAR) 50 MG tablet TAKE 1 TABLET BY MOUTH DAILY 90 tablet 1  . metoprolol succinate (TOPROL-XL) 50 MG 24 hr tablet TAKE 1 TABLET BY MOUTH DAILY WITH A MEAL 90 tablet 1  . neomycin-polymyxin-hydrocortisone (CORTISPORIN) 3.5-10000-1 ophthalmic suspension Place 3 drops into both eyes 3 (three) times daily. 7.5 mL 0  . Peppermint Oil (IBGARD) 90 MG CPCR Take 2 capsules by mouth 2 (two) times daily as needed. 120 capsule 5  . progesterone (PROMETRIUM) 100 MG capsule TAKE 1 CAPSULE BY MOUTH DAILY 90 capsule  1  . saccharomyces boulardii (FLORASTOR) 250 MG capsule Take 250 mg by mouth daily.     No facility-administered medications prior to visit.    Allergies  Allergen Reactions  . Demerol Nausea And Vomiting  . Singulair [Montelukast]     Dry mouth/dehydration  . Augmentin [Amoxicillin-Pot Clavulanate] Diarrhea    Caused Diarrhea    Review of Systems  Constitutional:  Positive for malaise/fatigue. Negative for fever.  HENT:  Positive for congestion and sore throat.   Eyes:  Negative for blurred vision.  Respiratory:  Positive for cough and sputum production. Negative for shortness of breath and wheezing.   Cardiovascular:  Negative for chest pain, palpitations and leg swelling.  Gastrointestinal:  Negative for abdominal pain, blood in stool and nausea.  Genitourinary:  Negative for dysuria and frequency.  Musculoskeletal:  Negative for falls.  Skin:  Negative for rash.  Neurological:  Negative for dizziness, loss of consciousness and headaches.  Endo/Heme/Allergies:  Negative for environmental allergies.  Psychiatric/Behavioral:  Negative for depression. The patient is not nervous/anxious.       Objective:    Physical Exam Constitutional:      General: She is not in acute distress.    Appearance: Normal appearance. She is not ill-appearing or toxic-appearing.  HENT:     Head: Normocephalic and atraumatic.     Right Ear: External ear normal.     Left Ear: External ear normal.     Nose: Nose normal.  Eyes:     General:        Right eye: No discharge.        Left eye: No discharge.  Pulmonary:     Effort: Pulmonary effort is normal.  Skin:    Findings: No rash.  Neurological:     Mental Status: She is alert and oriented to person, place, and  time.  Psychiatric:        Behavior: Behavior normal.   BP (!) 149/85   Pulse 65   LMP 02/08/1997  Wt Readings from Last 3 Encounters:  01/04/23 116 lb (52.6 kg)  10/22/22 113 lb (51.3 kg)  10/04/22 113 lb (51.3 kg)        Assessment & Plan:  Primary hypertension Assessment & Plan: Monitor and report any concerns, no changes to meds. Encouraged heart healthy diet such as the DASH diet and exercise as tolerated.    Sinusitis, unspecified chronicity, unspecified location Assessment & Plan: She has been struggling with URI symptoms. Encouraged increased rest and hydration, add probiotics, zinc such as Coldeze or Xicam. Treat fevers as needed    Other orders -     HYDROcodone Bit-Homatrop MBr; Take 5 mLs by mouth 2 (two) times daily as needed for cough.  Dispense: 120 mL; Refill: 0 -     methylPREDNISolone; 6 tabs po x 1 day then 5 tabs po x 1 day then 4 tabs po x 1 day then 3 tabs po x 1 day then 2 tabs po x 1 day then 1 tab po x 1 day and stop  Dispense: 21 each; Refill: 0 -     Azithromycin; Take 2 tablets on day 1, then 1 tablet daily on days 2 through 5  Dispense: 6 tablet; Refill: 0     Assessment and Plan    Upper Respiratory Infection Persistent cough and laryngitis for a month. No improvement with Robitussin, which causes diarrhea. No fever, chest pain, nausea, or vomiting. -Prescribe Medrol Dosepak to reduce inflammation. -Prescribe Hydromet cough syrup to help with sleep and reduce coughing. -Prescribe Azithromycin (Z-Pak) to cover potential bacterial infection. -Advise to increase hydration and maintain regular protein intake. -Check in by the end of the week to assess improvement.  Unexplained Weight Loss Ongoing for years. No new symptoms or changes reported. -Continue monitoring.  Follow-up appointment scheduled for end of February 2025.         I discussed the assessment and treatment plan with the patient. The patient was provided an opportunity to ask questions and all were answered. The patient agreed with the plan and demonstrated an understanding of the instructions.   The patient was advised to call back or seek an in-person evaluation if the symptoms worsen or if the  condition fails to improve as anticipated.  Danise Edge, MD [facility] at [facility] [phone] (phone) [phone] (fax)  [facility]

## 2023-02-10 ENCOUNTER — Other Ambulatory Visit: Payer: Self-pay | Admitting: Family Medicine

## 2023-02-15 ENCOUNTER — Encounter: Payer: Self-pay | Admitting: Family Medicine

## 2023-02-21 ENCOUNTER — Telehealth: Payer: Self-pay

## 2023-02-21 NOTE — Telephone Encounter (Signed)
 Initial Comment Caller states she has laryngitis is a Pt. of Dr. Domenica. Can't stop coughing and is in bed. Can feel congestion in her chest but it won't come up. Is taking Robitussin but it is not working. Translation No Nurse Assessment Nurse: Signa, RN, Devere Shell Date/Time Titus Time): 02/20/2023 2:33:11 PM Confirm and document reason for call. If symptomatic, describe symptoms. ---Patient states she has cough and congestion. Patient has sore throat. Patient's cough is very productive. Patient is trying OTC cough medication, but it isn't helping. Patient is hoarse. Patient has a headache. Patient has no other sx. Does the patient have any new or worsening symptoms? ---Yes Will a triage be completed? ---Yes Related visit to physician within the last 2 weeks? ---No Does the PT have any chronic conditions? (i.e. diabetes, asthma, this includes High risk factors for pregnancy, etc.) ---No Is this a behavioral health or substance abuse call? ---No Guidelines Guideline Title Affirmed Question Affirmed Notes Nurse Date/Time (Eastern Time) Cough - Chronic Change in color of sputum (e.g., from white to yellow-green sputum) Signa, RN, Devere Shell 02/20/2023 2:35:52 PM Disp. Time Titus Time) Disposition Final User 02/20/2023 1:53:11 PM Send to Clinical Louis Muzzy, RN, Dorothyann MASON NOTE: All timestamps contained within this report are represented as Eastern Standard Time. CONFIDENTIALTY NOTICE: This fax transmission is intended only for the addressee. It contains information that is legally privileged, confidential or otherwise protected from use or disclosure. If you are not the intended recipient, you are strictly prohibited from reviewing, disclosing, copying using or disseminating any of this information or taking any action in reliance on or regarding this information. If you have received this fax in error, please notify us  immediately by telephone so that we  can arrange for its return to us . Phone: [phone], Toll-Free: [phone], Fax: [phone] Page: 2 of 2 Call Id: [id number] Disp. Time Titus Time) Disposition Final User 02/20/2023 2:39:49 PM See PCP within 24 Hours Yes Signa, RN, Devere Shell Final Disposition 02/20/2023 2:39:49 PM See PCP within 24 Hours Yes Signa, RN, Devere Shell Caller Disagree/Comply Comply Caller Understands Yes PreDisposition Home Care Care Advice Given Per Guideline SEE PCP WITHIN 24 HOURS: * IF OFFICE WILL BE OPEN: You need to be examined within the next 24 hours. Call your doctor (or NP/PA) when the office opens and make an appointment. * IF OFFICE WILL BE CLOSED: You need to be seen within the next 24 hours. A clinic or an urgent care center is often a good source of care if your doctor's office is closed or you can't get an appointment. HUMIDIFIER: * If the air is dry, use a humidifier in the bedroom. CALL BACK IF: * Difficulty breathing occurs * You become worse CARE ADVICE given per Cough - Chronic (Adult) guideline. Referrals REFERRED TO PCP OFFICE

## 2023-02-21 NOTE — Telephone Encounter (Signed)
 Called patient. Per triage notes an appointment was made and patient will be seen by L. Drubel tomorrow (02/22/2023) at 2:20 pm

## 2023-02-22 ENCOUNTER — Ambulatory Visit: Payer: Medicare PPO | Admitting: Physician Assistant

## 2023-02-22 ENCOUNTER — Encounter: Payer: Self-pay | Admitting: Physician Assistant

## 2023-02-22 VITALS — BP 140/86 | HR 65 | Temp 98.2°F | Ht 64.0 in | Wt 122.4 lb

## 2023-02-22 DIAGNOSIS — J4540 Moderate persistent asthma, uncomplicated: Secondary | ICD-10-CM

## 2023-02-22 NOTE — Progress Notes (Signed)
 Established patient visit   Patient: Casey Rowe   DOB: 12-03-1950   73 y.o. Female  MRN: [id number] Visit Date: 02/22/2023  Today's healthcare provider: Manuelita Flatness, PA-C   Cc. Cough f/u  Subjective    Pt reports feeling nearly 100% better. No longer coughing much, denies wheezing. She reports nasal congestion.   She is using her albuterol  inhaler TID still. Reports she was not using advair regularly.  asthma Medications: Outpatient Medications Prior to Visit  Medication Sig   albuterol  (VENTOLIN  HFA) 108 (90 Base) MCG/ACT inhaler Inhale 1-2 puffs into the lungs every 4 (four) hours as needed for wheezing or shortness of breath.   azelastine  (ASTELIN ) 0.1 % nasal spray Place 2 sprays into both nostrils 2 (two) times daily. Use in each nostril as directed   buPROPion  (WELLBUTRIN  XL) 300 MG 24 hr tablet Take 300 mg by mouth daily.   busPIRone  (BUSPAR ) 5 MG tablet Take 5 mg by mouth 3 (three) times daily.    fenofibrate  micronized (LOFIBRA) 134 MG capsule TAKE ONE CAPSULE BY MOUTH EVERY MORNING BEFORE BREAKFAST   fluticasone -salmeterol (ADVAIR DISKUS) 250-50 MCG/ACT AEPB Inhale 1 puff into the lungs in the morning and at bedtime.   gabapentin (NEURONTIN) 300 MG capsule Take 300 mg by mouth 3 (three) times daily.   losartan  (COZAAR ) 50 MG tablet TAKE 1 TABLET BY MOUTH DAILY   [DISCONTINUED] ALPRAZolam (XANAX) 0.25 MG tablet Take 0.25 mg by mouth daily.    [DISCONTINUED] cholestyramine  (QUESTRAN ) 4 g packet Take 1 packet (4 g total) by mouth 2 (two) times daily as needed.   [DISCONTINUED] estradiol  (ESTRACE ) 1 MG tablet TAKE 1 TABLET BY MOUTH DAILY   [DISCONTINUED] HYDROcodone  bit-homatropine (HYDROMET) 5-1.5 MG/5ML syrup Take 5 mLs by mouth 2 (two) times daily as needed for cough.   [DISCONTINUED] methylPREDNISolone  (MEDROL  DOSEPAK) 4 MG TBPK tablet 6 tabs po x 1 day then 5 tabs po x 1 day then 4 tabs po x 1 day then 3 tabs po x 1 day then 2 tabs po x 1 day then 1 tab po x 1  day and stop   [DISCONTINUED] metoprolol  succinate (TOPROL -XL) 50 MG 24 hr tablet TAKE 1 TABLET BY MOUTH DAILY WITH A MEAL   [DISCONTINUED] neomycin -polymyxin-hydrocortisone (CORTISPORIN) 3.5-10000-1 ophthalmic suspension Place 3 drops into both eyes 3 (three) times daily.   [DISCONTINUED] Peppermint Oil (IBGARD) 90 MG CPCR Take 2 capsules by mouth 2 (two) times daily as needed.   [DISCONTINUED] progesterone  (PROMETRIUM ) 100 MG capsule TAKE 1 CAPSULE BY MOUTH DAILY   [DISCONTINUED] saccharomyces boulardii (FLORASTOR) 250 MG capsule Take 250 mg by mouth daily.   No facility-administered medications prior to visit.    Review of Systems  Constitutional:  Negative for fatigue and fever.  HENT:  Positive for congestion.   Respiratory:  Negative for cough and shortness of breath.   Cardiovascular:  Negative for chest pain and leg swelling.  Gastrointestinal:  Negative for abdominal pain.  Neurological:  Negative for dizziness and headaches.       Objective    BP (!) 140/86   Pulse 65   Temp 98.2 F (36.8 C) (Oral)   Ht 5' 4 (1.626 m)   Wt 122 lb 6 oz (55.5 kg)   LMP 02/08/1997   SpO2 100%   BMI 21.01 kg/m    Physical Exam Constitutional:      General: She is awake.     Appearance: She is well-developed.  HENT:  Head: Normocephalic.     Mouth/Throat:     Pharynx: No oropharyngeal exudate or posterior oropharyngeal erythema.  Eyes:     Conjunctiva/sclera: Conjunctivae normal.  Cardiovascular:     Rate and Rhythm: Normal rate and regular rhythm.     Heart sounds: Normal heart sounds.  Pulmonary:     Effort: Pulmonary effort is normal.     Breath sounds: Normal breath sounds.  Skin:    General: Skin is warm.  Neurological:     Mental Status: She is alert and oriented to person, place, and time.  Psychiatric:        Attention and Perception: Attention normal.        Mood and Affect: Mood normal.        Speech: Speech normal.        Behavior: Behavior is cooperative.      No results found for any visits on 02/22/23.  Assessment & Plan    Moderate persistent asthma without complication Assessment & Plan: Reviewed maintenance inhaler vs rescue inhaler Recommend advair BID and albuterol  prn    For nasal congestion, recommend daily saline nasal rinses and flonase  or similar otc bid Return if symptoms worsen or fail to improve.       Manuelita Flatness, PA-C  [facility] at [facility] [phone] (phone) [phone] (fax)  [facility]

## 2023-02-22 NOTE — Assessment & Plan Note (Signed)
 Reviewed maintenance inhaler vs rescue inhaler Recommend advair BID and albuterol prn

## 2023-03-06 ENCOUNTER — Other Ambulatory Visit: Payer: Self-pay | Admitting: Family Medicine

## 2023-03-08 ENCOUNTER — Encounter: Payer: Self-pay | Admitting: Family Medicine

## 2023-03-08 MED ORDER — ALBUTEROL SULFATE HFA 108 (90 BASE) MCG/ACT IN AERS: 1.0000 | INHALATION_SPRAY | RESPIRATORY_TRACT | 5 refills | Status: AC | PRN

## 2023-03-09 ENCOUNTER — Other Ambulatory Visit: Payer: Self-pay | Admitting: Family Medicine

## 2023-03-10 ENCOUNTER — Other Ambulatory Visit: Payer: Self-pay | Admitting: Family Medicine

## 2023-03-11 ENCOUNTER — Other Ambulatory Visit: Payer: Self-pay | Admitting: Family Medicine

## 2023-03-11 ENCOUNTER — Ambulatory Visit: Payer: Medicare PPO | Admitting: Physician Assistant

## 2023-03-11 ENCOUNTER — Encounter: Payer: Self-pay | Admitting: Physician Assistant

## 2023-03-11 ENCOUNTER — Encounter: Payer: Self-pay | Admitting: Family Medicine

## 2023-03-11 ENCOUNTER — Ambulatory Visit (HOSPITAL_BASED_OUTPATIENT_CLINIC_OR_DEPARTMENT_OTHER)
Admission: RE | Admit: 2023-03-11 | Discharge: 2023-03-11 | Disposition: A | Discharge status: Home / Self Care | Payer: Medicare PPO | Source: Ambulatory Visit | Attending: Physician Assistant | Admitting: Physician Assistant

## 2023-03-11 VITALS — BP 126/73 | HR 69 | Temp 97.6°F | Resp 16 | Ht 65.0 in | Wt 122.8 lb

## 2023-03-11 DIAGNOSIS — D869 Sarcoidosis, unspecified: Secondary | ICD-10-CM

## 2023-03-11 DIAGNOSIS — J45909 Unspecified asthma, uncomplicated: Secondary | ICD-10-CM | POA: Diagnosis not present

## 2023-03-11 DIAGNOSIS — J4540 Moderate persistent asthma, uncomplicated: Secondary | ICD-10-CM

## 2023-03-11 DIAGNOSIS — R059 Cough, unspecified: Secondary | ICD-10-CM | POA: Diagnosis not present

## 2023-03-11 DIAGNOSIS — J4541 Moderate persistent asthma with (acute) exacerbation: Secondary | ICD-10-CM | POA: Insufficient documentation

## 2023-03-11 MED ORDER — BENZONATATE 100 MG PO CAPS: 100.0000 mg | ORAL_CAPSULE | Freq: Two times a day (BID) | ORAL | 0 refills | Status: DC | PRN

## 2023-03-11 MED ORDER — DOXYCYCLINE HYCLATE 100 MG PO TABS: 100.0000 mg | ORAL_TABLET | Freq: Two times a day (BID) | ORAL | 0 refills | Status: AC

## 2023-03-11 MED ORDER — NEBULIZER MASK ADULT/TUBING MISC: 1.0000 | Freq: Four times a day (QID) | 0 refills | Status: AC

## 2023-03-11 MED ORDER — ALBUTEROL SULFATE (2.5 MG/3ML) 0.083% IN NEBU: 2.5000 mg | INHALATION_SOLUTION | Freq: Four times a day (QID) | RESPIRATORY_TRACT | 1 refills | Status: DC | PRN

## 2023-03-11 MED ORDER — NEBULIZER MISC: 0 refills | Status: AC

## 2023-03-11 NOTE — Telephone Encounter (Signed)
Called and spoke with patient. She will be coming in this afternoon (03/11/2023) to be seen by Lillia Abed

## 2023-03-11 NOTE — Telephone Encounter (Signed)
Called patient on both mobile and home phones. There was no answer. A message was left on both. Will continue to reach out, so an appointment can be made for this afternoon.

## 2023-03-11 NOTE — Progress Notes (Signed)
Established patient visit   Patient: Casey Rowe   DOB: 1950/07/20   73 y.o. Female  MRN: [id number] Visit Date: 03/11/2023  Today's healthcare provider: Alfredia Ferguson, PA-C   Cc. Respiratory symptoms worsening x 1 mo  Subjective     Pt, with a history of sarcoidosis, asthma, presents today with a worsening cough w/ green phlegm, wheezing for the last month. Last night was very difficult. Albuterol provides limited relief. Some sob, denies fevers.  Medications: Outpatient Medications Prior to Visit  Medication Sig   albuterol (VENTOLIN HFA) 108 (90 Base) MCG/ACT inhaler Inhale 1-2 puffs into the lungs every 4 (four) hours as needed for wheezing or shortness of breath.   azelastine (ASTELIN) 0.1 % nasal spray Place 2 sprays into both nostrils 2 (two) times daily. Use in each nostril as directed   buPROPion (WELLBUTRIN XL) 300 MG 24 hr tablet Take 300 mg by mouth daily.   busPIRone (BUSPAR) 5 MG tablet Take 5 mg by mouth 3 (three) times daily.    fenofibrate micronized (LOFIBRA) 134 MG capsule TAKE ONE CAPSULE BY MOUTH EVERY MORNING BEFORE BREAKFAST   fluticasone-salmeterol (ADVAIR DISKUS) 250-50 MCG/ACT AEPB Inhale 1 puff into the lungs in the morning and at bedtime.   gabapentin (NEURONTIN) 300 MG capsule Take 300 mg by mouth 3 (three) times daily.   losartan (COZAAR) 50 MG tablet TAKE 1 TABLET BY MOUTH DAILY   metoprolol succinate (TOPROL-XL) 50 MG 24 hr tablet TAKE 1 TABLET BY MOUTH DAILY WITH A MEAL   No facility-administered medications prior to visit.    Review of Systems  Constitutional:  Positive for fatigue. Negative for fever.  Respiratory:  Positive for cough, shortness of breath and wheezing.   Cardiovascular:  Negative for chest pain and leg swelling.  Gastrointestinal:  Negative for abdominal pain.  Neurological:  Negative for dizziness and headaches.       Objective    BP 126/73 (BP Location: Left Arm, Patient Position: Sitting, Cuff Size: Normal)    Pulse 69   Temp 97.6 F (36.4 C) (Oral)   Resp 16   Ht 5\' 5"  (1.651 m)   Wt 122 lb 12.8 oz (55.7 kg)   LMP 02/08/1997   SpO2 96%   BMI 20.43 kg/m    Physical Exam Vitals reviewed.  Constitutional:      Appearance: She is not ill-appearing.  HENT:     Head: Normocephalic.  Eyes:     Conjunctiva/sclera: Conjunctivae normal.  Cardiovascular:     Rate and Rhythm: Normal rate.  Pulmonary:     Effort: Pulmonary effort is normal. No respiratory distress.     Breath sounds: Examination of the right-upper field reveals rhonchi. Examination of the right-middle field reveals rhonchi. Examination of the right-lower field reveals wheezing. Wheezing and rhonchi present.  Neurological:     General: No focal deficit present.     Mental Status: She is alert and oriented to person, place, and time.  Psychiatric:        Mood and Affect: Mood normal.        Behavior: Behavior normal.      No results found for any visits on 03/11/23.  Assessment & Plan    Moderate persistent asthma with exacerbation -     DG Chest 2 View; Future -     Doxycycline Hyclate; Take 1 tablet (100 mg total) by mouth 2 (two) times daily for 7 days.  Dispense: 14 tablet; Refill: 0 -  Benzonatate; Take 1 capsule (100 mg total) by mouth 2 (two) times daily as needed.  Dispense: 20 capsule; Refill: 0 -     Albuterol Sulfate; Take 3 mLs (2.5 mg total) by nebulization every 6 (six) hours as needed for wheezing or shortness of breath.  Dispense: 156 each; Refill: 1 -     Nebulizer Mask Adult/Tubing; 1 each by Does not apply route every 6 (six) hours. Use w/ nebulized med q 6 hours  Dispense: 1 each; Refill: 0 -     Nebulizer; Use w/ nebulized medication q 6 hours  Dispense: 1 each; Refill: 0  Sarcoidosis -     DG Chest 2 View; Future  Will rx doxy bid x 7 days , ordering chest xray to r/o pneumonia Rx tessalon for cough, ok to take otc robitussion as well   Advised pt to get a nebulizer machine, will send in rx  to pharmacy w/ nebulized albuterol. A referral was placed to pulmonology today from her pcp.   Return if symptoms worsen or fail to improve.       Alfredia Ferguson, PA-C  [facility] at [facility] [phone] (phone) [phone] (fax)  [facility]

## 2023-03-17 ENCOUNTER — Encounter (HOSPITAL_BASED_OUTPATIENT_CLINIC_OR_DEPARTMENT_OTHER): Payer: Self-pay | Admitting: Pulmonary Disease

## 2023-03-17 ENCOUNTER — Ambulatory Visit (HOSPITAL_BASED_OUTPATIENT_CLINIC_OR_DEPARTMENT_OTHER): Payer: Medicare PPO | Admitting: Pulmonary Disease

## 2023-03-17 VITALS — BP 132/80 | HR 67 | Ht 65.0 in | Wt 119.0 lb

## 2023-03-17 DIAGNOSIS — J209 Acute bronchitis, unspecified: Secondary | ICD-10-CM

## 2023-03-17 DIAGNOSIS — J4540 Moderate persistent asthma, uncomplicated: Secondary | ICD-10-CM | POA: Diagnosis not present

## 2023-03-17 DIAGNOSIS — D869 Sarcoidosis, unspecified: Secondary | ICD-10-CM | POA: Diagnosis not present

## 2023-03-17 MED ORDER — PROMETHAZINE-DM 6.25-15 MG/5ML PO SYRP: 5.0000 mL | ORAL_SOLUTION | Freq: Four times a day (QID) | ORAL | 0 refills | Status: DC | PRN

## 2023-03-17 MED ORDER — PREDNISONE 10 MG PO TABS: ORAL_TABLET | ORAL | 0 refills | Status: DC

## 2023-03-17 NOTE — Patient Instructions (Signed)
 X Refill on albuterol  nebs  Prednisone  10 mg tabs Take 4 tabs  daily with food x 4 days, then 3 tabs daily x 4 days, then 2 tabs daily x 4 days, then 1 tab daily x4 days then stop. #40

## 2023-03-17 NOTE — Progress Notes (Signed)
 Subjective:    Patient ID: Casey Rowe [place], female    DOB: 05/27/1950, 73 y.o.   MRN: [id number]  HPI  73 yo never smoker for FU of  sarcoidosis, asthma  Casey Rowe pt Dx in 1980s via lung biopsy    Discussed the use of AI scribe software for clinical note transcription with the patient, who gave verbal consent to proceed.  History of Present Illness   The patient, with a history of sarcoidosis, presents with a persistent cough and congestion that has been ongoing for a couple of months. The cough is described as deep and gut-wrenching, often leading to gagging. The patient reports that the cough is constant if not managed with Robitussin or a nebulizer treatment. The patient has been on a nebulizer treatment every six hours for an unspecified duration. Despite these treatments, the patient reports that the cough and congestion have been getting worse since around Christmas time. The patient denies any recent colds or illnesses prior to the onset of the cough. The patient also reports a loss of appetite and weight loss over the past couple of years. The patient denies any changes in her surroundings or new pets. The patient also denies any heartburn.      Reviewed PCP visits x 3 First time -given Medrol  Dosepak, Z-Pak and hydrocodone  cough syrup  Second visit -felt better Third visit -given doxycycline  and albuterol  nebs CXR 1/31 clear  Significant tests/ events reviewed  08/19/21 FVC 3.31 (107%) FEV1 2.48 (106%) Ratio 79  TLC 117% RV 139% DLCO 91%.   Review of Systems neg for any significant sore throat, dysphagia, itching, sneezing, nasal congestion or excess/ purulent secretions, fever, chills, sweats, unintended wt loss, pleuritic or exertional cp, hempoptysis, orthopnea pnd or change in chronic leg swelling. Also denies presyncope, palpitations, heartburn, abdominal pain, nausea, vomiting, diarrhea or change in bowel or urinary habits, dysuria,hematuria, rash, arthralgias, visual  complaints, headache, numbness weakness or ataxia.     Objective:   Physical Exam   Gen. Pleasant, well-nourished, in no distress, 1 bout of deep coughing ENT - no thrush, no pallor/icterus,no post nasal drip Neck: No JVD, no thyromegaly, no carotid bruits Lungs: no use of accessory muscles, no dullness to percussion, clear without rales or rhonchi  Cardiovascular: Rhythm regular, heart sounds  normal, no murmurs or gallops, no peripheral edema Musculoskeletal: No deformities, no cyanosis or clubbing       Assessment & Plan:    Assessment and Plan    Moderate Persistent Asthma Presents with persistent cough and dyspnea for several months, likely post viral bronchitis. Currently using a nebulizer every six hours. Previous treatments included prednisone , azithromycin , doxycycline , and hydrocodone  cough syrup. Reports cough worsens without nebulizer, productive with green sputum. Chest x-ray clear, no pneumonia or active sarcoidosis. Suspected residual bronchitis exacerbated by asthma. Discussed risks and benefits of prednisone , including potential increase in appetite, and plan to taper nebulizer usage as symptoms improve. - Prescribe another round of prednisonex 2 weeks starting at 40 mg - Refill nebulizer medication - Advise to decrease nebulizer usage as symptoms improve (every 8 hours, then every 12 hours, and eventually as needed) - Prescribe promethazine  with DM for cough suppression - Schedule follow-up appointment in four weeks  Sarcoidosis Recent chest x-ray shows no signs of active disease. Unlikely contributing to current symptoms. - Monitor for any signs of reactivation  Unintentional Weight Loss Reports significant loss of appetite and weight loss over the past couple of years. Current weight 117 lbs. Denies  heartburn, no increase in appetite with previous prednisone  use. Current medications should not suppress appetite. Discussed importance of maintaining nutrition  despite lack of hunger. - Monitor weight and appetite - Encourage regular meals despite lack of hunger  General Health Maintenance Up to date with COVID-19 vaccinations and compliant with all recommended immunizations. - Continue to follow vaccination recommendations  Follow-up - Schedule follow-up appointment in four weeks - Send a note to PCP to keep her informed.

## 2023-03-18 ENCOUNTER — Encounter: Payer: Self-pay | Admitting: Physician Assistant

## 2023-03-19 ENCOUNTER — Encounter: Payer: Self-pay | Admitting: Physician Assistant

## 2023-03-24 ENCOUNTER — Other Ambulatory Visit: Payer: Self-pay | Admitting: Physician Assistant

## 2023-03-24 DIAGNOSIS — D869 Sarcoidosis, unspecified: Secondary | ICD-10-CM

## 2023-03-24 MED ORDER — PROMETHAZINE-DM 6.25-15 MG/5ML PO SYRP: 5.0000 mL | ORAL_SOLUTION | Freq: Four times a day (QID) | ORAL | 0 refills | Status: DC | PRN

## 2023-04-03 NOTE — Assessment & Plan Note (Signed)
Encouraged increased hydration and fiber in diet. Daily probiotics.  

## 2023-04-03 NOTE — Assessment & Plan Note (Addendum)
 Patient encouraged to maintain heart healthy diet, regular exercise, adequate sleep. Consider daily probiotics. Take medications as prescribed. Labs ordered and reviewed. Given and reviewed copy of ACP documents from [facility] Secretary of State and encouraged to complete and return  Colonoscopy at [facility] 2022 Pap  Mgm 04/15/2022, odered Dexa 11/2020 normal repeat in 2027

## 2023-04-03 NOTE — Assessment & Plan Note (Signed)
 Supplement and monitor

## 2023-04-03 NOTE — Assessment & Plan Note (Signed)
 Continue to stay active, learn something new, maintain a heart healthy diet and hydrate well

## 2023-04-03 NOTE — Assessment & Plan Note (Signed)
 Monitor and report any concerns, no changes to meds. Encouraged heart healthy diet such as the DASH diet and exercise as tolerated.  ?

## 2023-04-03 NOTE — Assessment & Plan Note (Signed)
 No recent exacerbation

## 2023-04-03 NOTE — Assessment & Plan Note (Signed)
 Encourage heart healthy diet such as MIND or DASH diet, increase exercise, avoid trans fats, simple carbohydrates and processed foods, consider a krill or fish or flaxseed oil cap daily.

## 2023-04-06 ENCOUNTER — Encounter: Payer: Self-pay | Admitting: Family Medicine

## 2023-04-06 NOTE — Telephone Encounter (Signed)
 Called patient and spoke with husband to confirm patient will be coming to her appointment tomorrow (04/07/23). Per patient, she will be keeping her appointment.

## 2023-04-07 ENCOUNTER — Ambulatory Visit (INDEPENDENT_AMBULATORY_CARE_PROVIDER_SITE_OTHER): Payer: Medicare PPO | Admitting: Family Medicine

## 2023-04-07 VITALS — BP 132/78 | HR 64 | Temp 98.8°F | Resp 16 | Ht 63.0 in | Wt 125.0 lb

## 2023-04-07 DIAGNOSIS — E538 Deficiency of other specified B group vitamins: Secondary | ICD-10-CM | POA: Diagnosis not present

## 2023-04-07 DIAGNOSIS — Z1239 Encounter for other screening for malignant neoplasm of breast: Secondary | ICD-10-CM

## 2023-04-07 DIAGNOSIS — I10 Essential (primary) hypertension: Secondary | ICD-10-CM | POA: Diagnosis not present

## 2023-04-07 DIAGNOSIS — R739 Hyperglycemia, unspecified: Secondary | ICD-10-CM | POA: Diagnosis not present

## 2023-04-07 DIAGNOSIS — K582 Mixed irritable bowel syndrome: Secondary | ICD-10-CM | POA: Diagnosis not present

## 2023-04-07 DIAGNOSIS — Z0000 Encounter for general adult medical examination without abnormal findings: Secondary | ICD-10-CM

## 2023-04-07 DIAGNOSIS — E785 Hyperlipidemia, unspecified: Secondary | ICD-10-CM

## 2023-04-07 DIAGNOSIS — G3184 Mild cognitive impairment, so stated: Secondary | ICD-10-CM

## 2023-04-07 DIAGNOSIS — J4540 Moderate persistent asthma, uncomplicated: Secondary | ICD-10-CM

## 2023-04-07 DIAGNOSIS — E559 Vitamin D deficiency, unspecified: Secondary | ICD-10-CM | POA: Diagnosis not present

## 2023-04-07 LAB — LIPID PANEL
Cholesterol: 202 mg/dL — ABNORMAL HIGH (ref 0–200)
HDL: 64 mg/dL (ref >39.00)
LDL Cholesterol: 107 mg/dL — ABNORMAL HIGH (ref 0–99)
NonHDL: 138.08
Total CHOL/HDL Ratio: 3
Triglycerides: 153 mg/dL — ABNORMAL HIGH (ref 0.0–149.0)
VLDL: 30.6 mg/dL (ref 0.0–40.0)

## 2023-04-07 LAB — CBC WITH DIFFERENTIAL/PLATELET
Basophils Absolute: 0 10*3/uL (ref 0.0–0.1)
Basophils Relative: 0.8 % (ref 0.0–3.0)
Eosinophils Absolute: 0.3 10*3/uL (ref 0.0–0.7)
Eosinophils Relative: 6.2 % — ABNORMAL HIGH (ref 0.0–5.0)
HCT: 40.3 % (ref 36.0–46.0)
Hemoglobin: 13.1 g/dL (ref 12.0–15.0)
Lymphocytes Relative: 17.2 % (ref 12.0–46.0)
Lymphs Abs: 0.8 10*3/uL (ref 0.7–4.0)
MCHC: 32.4 g/dL (ref 30.0–36.0)
MCV: 103.2 fl — ABNORMAL HIGH (ref 78.0–100.0)
Monocytes Absolute: 0.5 10*3/uL (ref 0.1–1.0)
Monocytes Relative: 10.4 % (ref 3.0–12.0)
Neutro Abs: 2.9 10*3/uL (ref 1.4–7.7)
Neutrophils Relative %: 65.4 % (ref 43.0–77.0)
Platelets: 203 10*3/uL (ref 150.0–400.0)
RBC: 3.9 Mil/uL (ref 3.87–5.11)
RDW: 14.8 % (ref 11.5–15.5)
WBC: 4.5 10*3/uL (ref 4.0–10.5)

## 2023-04-07 LAB — COMPREHENSIVE METABOLIC PANEL
ALT: 16 U/L (ref 0–35)
AST: 16 U/L (ref 0–37)
Albumin: 3.9 g/dL (ref 3.5–5.2)
Alkaline Phosphatase: 42 U/L (ref 39–117)
BUN: 14 mg/dL (ref 6–23)
CO2: 30 meq/L (ref 19–32)
Calcium: 9.1 mg/dL (ref 8.4–10.5)
Chloride: 106 meq/L (ref 96–112)
Creatinine, Ser: 0.83 mg/dL (ref 0.40–1.20)
GFR: 71.34 mL/min (ref >60.00)
Glucose, Bld: 72 mg/dL (ref 70–99)
Potassium: 4.3 meq/L (ref 3.5–5.1)
Sodium: 140 meq/L (ref 135–145)
Total Bilirubin: 0.5 mg/dL (ref 0.2–1.2)
Total Protein: 6.4 g/dL (ref 6.0–8.3)

## 2023-04-07 LAB — VITAMIN D 25 HYDROXY (VIT D DEFICIENCY, FRACTURES): VITD: 33.03 ng/mL (ref 30.00–100.00)

## 2023-04-07 LAB — TSH: TSH: 1.04 u[IU]/mL (ref 0.35–5.50)

## 2023-04-07 LAB — HEMOGLOBIN A1C: Hgb A1c MFr Bld: 5.5 % (ref 4.6–6.5)

## 2023-04-07 LAB — VITAMIN B12: Vitamin B-12: 286 pg/mL (ref 211–911)

## 2023-04-07 MED ORDER — AZITHROMYCIN 250 MG PO TABS: ORAL_TABLET | ORAL | 0 refills | Status: AC

## 2023-04-07 NOTE — Patient Instructions (Addendum)
 Had colonoscopy at [facility] in July 2022 had two adenomatous polyps removed. F/u with them at the 3-5 year mark about when they want to repeat  Get [place] a copy of the HCP healthcare power of attorney and the Living Will for your medical record  RSV, Respiratory Syncitial Virus, Arexvy at pharmacy      Preventive Care 65 Years and Older, Female Preventive care refers to lifestyle choices and visits with your health care provider that can promote health and wellness. Preventive care visits are also called wellness exams. What can I expect for my preventive care visit? Counseling Your health care provider may ask you questions about your: Medical history, including: Past medical problems. Family medical history. Pregnancy and menstrual history. History of falls. Current health, including: Memory and ability to understand (cognition). Emotional well-being. Home life and relationship well-being. Sexual activity and sexual health. Lifestyle, including: Alcohol, nicotine or tobacco, and drug use. Access to firearms. Diet, exercise, and sleep habits. Work and work Astronomer. Sunscreen use. Safety issues such as seatbelt and bike helmet use. Physical exam Your health care provider will check your: Height and weight. These may be used to calculate your BMI (body mass index). BMI is a measurement that tells if you are at a healthy weight. Waist circumference. This measures the distance around your waistline. This measurement also tells if you are at a healthy weight and may help predict your risk of certain diseases, such as type 2 diabetes and high blood pressure. Heart rate and blood pressure. Body temperature. Skin for abnormal spots. What immunizations do I need?  Vaccines are usually given at various ages, according to a schedule. Your health care provider will recommend vaccines for you based on your age, medical history, and lifestyle or other factors, such as travel or where you  work. What tests do I need? Screening Your health care provider may recommend screening tests for certain conditions. This may include: Lipid and cholesterol levels. Hepatitis C test. Hepatitis B test. HIV (human immunodeficiency virus) test. STI (sexually transmitted infection) testing, if you are at risk. Lung cancer screening. Colorectal cancer screening. Diabetes screening. This is done by checking your blood sugar (glucose) after you have not eaten for a while (fasting). Mammogram. Talk with your health care provider about how often you should have regular mammograms. BRCA-related cancer screening. This may be done if you have a family history of breast, ovarian, tubal, or peritoneal cancers. Bone density scan. This is done to screen for osteoporosis. Talk with your health care provider about your test results, treatment options, and if necessary, the need for more tests. Follow these instructions at home: Eating and drinking  Eat a diet that includes fresh fruits and vegetables, whole grains, lean protein, and low-fat dairy products. Limit your intake of foods with high amounts of sugar, saturated fats, and salt. Take vitamin and mineral supplements as recommended by your health care provider. Do not drink alcohol if your health care provider tells you not to drink. If you drink alcohol: Limit how much you have to 0-1 drink a day. Know how much alcohol is in your drink. In the [place] one drink equals one 12 oz bottle of beer (355 mL), one 5 oz glass of wine (148 mL), or one 1 oz glass of hard liquor (44 mL). Lifestyle Brush your teeth every morning and night with fluoride toothpaste. Floss one time each day. Exercise for at least 30 minutes 5 or more days each week. Do not use any  products that contain nicotine or tobacco. These products include cigarettes, chewing tobacco, and vaping devices, such as e-cigarettes. If you need help quitting, ask your health care provider. Do not  use drugs. If you are sexually active, practice safe sex. Use a condom or other form of protection in order to prevent STIs. Take aspirin only as told by your health care provider. Make sure that you understand how much to take and what form to take. Work with your health care provider to find out whether it is safe and beneficial for you to take aspirin daily. Ask your health care provider if you need to take a cholesterol-lowering medicine (statin). Find healthy ways to manage stress, such as: Meditation, yoga, or listening to music. Journaling. Talking to a trusted person. Spending time with friends and family. Minimize exposure to UV radiation to reduce your risk of skin cancer. Safety Always wear your seat belt while driving or riding in a vehicle. Do not drive: If you have been drinking alcohol. Do not ride with someone who has been drinking. When you are tired or distracted. While texting. If you have been using any mind-altering substances or drugs. Wear a helmet and other protective equipment during sports activities. If you have firearms in your house, make sure you follow all gun safety procedures. What's next? Visit your health care provider once a year for an annual wellness visit. Ask your health care provider how often you should have your eyes and teeth checked. Stay up to date on all vaccines. This information is not intended to replace advice given to you by your health care provider. Make sure you discuss any questions you have with your health care provider. Document Revised: 07/23/2020 Document Reviewed: 07/23/2020 Elsevier Patient Education  2024 [employer].

## 2023-04-10 ENCOUNTER — Encounter: Payer: Self-pay | Admitting: Family Medicine

## 2023-04-10 NOTE — Progress Notes (Signed)
 Subjective:    Patient ID: Casey Rowe, female    DOB: [birth year], 73 y.o.   MRN: [id number]  Chief Complaint  Patient presents with   Annual Exam    HPI Discussed the use of AI scribe software for clinical note transcription with the patient, who gave verbal consent to proceed.  History of Present Illness Casey Rowe is a 73 year old female who presents with a sore throat and headache.  She has been experiencing a sore throat and headache, with the sore throat being the most severe symptom. She has had a rough winter with multiple respiratory infections. Her appetite has been poor, though it is slightly improving, and she has gained weight from 113 pounds to 120 pounds. She associates her decreased appetite with sore throats, which she links to strep infections. She is currently taking azithromycin for her symptoms and is cautious about antibiotic use due to a history of diarrhea with Augmentin.  She has a history of chronic diarrhea, occurring multiple times a day. This has been a persistent issue, and she underwent a colonoscopy in 2022, during which two adenomatous polyps were removed. She is aware that tubular adenomas can sometimes be precancerous.    Past Medical History:  Diagnosis Date   Abdominal pain 08/08/2020   Acute nonsuppurative otitis media of left ear 12/22/2014   Allergy    Anorexia 01/14/2021   600 calories/daily   Asthma exacerbation 02/22/2012   Broken jaw    1980s; fall down flight of stairs   Callus of foot 12/28/2017   Coccydynia 04/08/2020   Concussion 06/2022   Fall at home    Conjunctivitis 04/04/2022   Fall 03/27/2017   At work on November 11, 2016, following with workman's comp  Last Assessment & Plan:   Larey Seat at work on 11/09/16 and has been following with [employer]. She   fell up some steps onto her knees and has been left with chronic pain.     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   Palpitation 03/24/2017   Rosacea 03/17/2020   Sarcoidosis    Sinusitis 01/18/2011   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   Vitamin B 12 deficiency 06/23/2022   Weight loss 11/07/2013    Past Surgical History:  Procedure Laterality Date   ABDOMINAL HYSTERECTOMY     CESAREAN SECTION     ECTOPIC PREGNANCY SURGERY  1981   FRACTURE SURGERY  1980s   broken jaw    Family History  Problem Relation Age of Onset   Hypertension Mother    Dementia Mother        multi-infarct   Diabetes Mother    Diabetes Father    Heart attack Father    Heart disease Father    Psoriasis Father    Cancer Sister        pancreatic   Alcohol abuse Sister        substance   Hepatitis B Brother  Gout Brother    Hypertension Brother    Glaucoma Brother    Heart disease Brother    Rashes / Skin problems Son    Other Son        bad allergies, aspirin, dermatitis   Rashes / Skin problems Son    Cancer Maternal Aunt        breast   Breast cancer Maternal Aunt     Social History   Socioeconomic History   Marital status: Married    Spouse name: Not on file   Number of children: Not on file   Years of education: 18   Highest education level: Master's degree (e.g., MA, MS, MEng, MEd, MSW, MBA)  Occupational History   Occupation: Retired  Tobacco Use   Smoking status: Never   Smokeless tobacco: Never  Vaping Use   Vaping status: Never Used  Substance and Sexual Activity   Alcohol use: Yes     Alcohol/week: 14.0 - 21.0 standard drinks of alcohol    Types: 14 - 21 Glasses of wine per week    Comment: couple glasses of wine "a lot of nights"   Drug use: No   Sexual activity: Yes  Other Topics Concern   Not on file  Social History Narrative   Works as a Runner, 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MarriedGrown children- 3   Master in science    One floor home   Right handed   Lives with husband   Retired   Drinks caffeine prn   Social Drivers of Corporate investment banker Strain: Low Risk  (01/04/2023)   Overall Financial Resource Strain (CARDIA)    Difficulty of Paying Living Expenses: Not hard at all  Food Insecurity: No Food Insecurity (01/04/2023)   Hunger Vital Sign    Worried About Running Out of Food in the Last Year: Never true    Ran Out of Food in the Last Year: Never true  Transportation Needs: No Transportation Needs (01/04/2023)   PRAPARE - Administrator, Civil Service (Medical): No    Lack of Transportation (Non-Medical): No  Physical Activity: Insufficiently Active (01/04/2023)   Exercise Vital Sign    Days of Exercise per Week: 2 days    Minutes of Exercise per Session: 30 min  Stress: Stress Concern Present (01/04/2023)   [employer] of Occupational Health - Occupational Stress Questionnaire    Feeling of Stress : To some extent  Social Connections: Socially Isolated (01/04/2023)   Social Connection and Isolation Panel [NHANES]    Frequency of Communication with Friends and Family: Once a week    Frequency of Social Gatherings with Friends and Family: Once a week    Attends Religious Services: Never    Database administrator or Organizations: No    Attends Engineer, structural: Not on file    Marital Status: Married  Catering manager Violence: Not on file    Outpatient Medications Prior to Visit  Medication Sig Dispense Refill   albuterol (PROVENTIL) (2.5 MG/3ML) 0.083% nebulizer solution Take 3 mLs (2.5 mg total) by nebulization  every 6 (six) hours as needed for wheezing or shortness of breath. 156 each 1   albuterol (VENTOLIN HFA) 108 (90 Base) MCG/ACT inhaler Inhale 1-2 puffs into the lungs every 4 (four) hours as needed for wheezing or shortness of breath. 18 g 5   azelastine (ASTELIN) 0.1 % nasal spray Place 2 sprays into both nostrils 2 (two) times daily. Use in each nostril as  directed 30 mL 12   benzonatate (TESSALON) 100 MG capsule Take 1 capsule (100 mg total) by mouth 2 (two) times daily as needed. 20 capsule 0   buPROPion (WELLBUTRIN XL) 300 MG 24 hr tablet Take 300 mg by mouth daily.     busPIRone (BUSPAR) 5 MG tablet Take 5 mg by mouth 3 (three) times daily.      fenofibrate micronized (LOFIBRA) 134 MG capsule TAKE ONE CAPSULE BY MOUTH EVERY MORNING BEFORE BREAKFAST 90 capsule 0   fluticasone-salmeterol (ADVAIR DISKUS) 250-50 MCG/ACT AEPB Inhale 1 puff into the lungs in the morning and at bedtime. 60 each 5   gabapentin (NEURONTIN) 300 MG capsule Take 300 mg by mouth 3 (three) times daily.     losartan (COZAAR) 50 MG tablet TAKE 1 TABLET BY MOUTH DAILY 90 tablet 1   metoprolol succinate (TOPROL-XL) 50 MG 24 hr tablet TAKE 1 TABLET BY MOUTH DAILY WITH A MEAL 90 tablet 1   Nebulizer MISC Use w/ nebulized medication q 6 hours 1 each 0   predniSONE (DELTASONE) 10 MG tablet Take 4 tabs  daily with food x 4 days, then 3 tabs daily x 4 days, then 2 tabs daily x 4 days, then 1 tab daily x4 days then stop. #40 40 tablet 0   promethazine-dextromethorphan (PROMETHAZINE-DM) 6.25-15 MG/5ML syrup Take 5 mLs by mouth 4 (four) times daily as needed for cough. 118 mL 0   Respiratory Therapy Supplies (NEBULIZER MASK ADULT/TUBING) MISC 1 each by Does not apply route every 6 (six) hours. Use w/ nebulized med q 6 hours 1 each 0   No facility-administered medications prior to visit.    Allergies  Allergen Reactions   Demerol Nausea And Vomiting   Singulair [Montelukast]     Dry mouth/dehydration   Augmentin [Amoxicillin-Pot  Clavulanate] Diarrhea    Caused Diarrhea    Review of Systems  Constitutional:  Positive for malaise/fatigue. Negative for chills and fever.  HENT:  Positive for sore throat. Negative for congestion and hearing loss.   Eyes:  Negative for discharge.  Respiratory:  Negative for cough, sputum production and shortness of breath.   Cardiovascular:  Negative for chest pain, palpitations and leg swelling.  Gastrointestinal:  Positive for diarrhea. Negative for abdominal pain, blood in stool, constipation, heartburn, nausea and vomiting.  Genitourinary:  Negative for dysuria, frequency, hematuria and urgency.  Musculoskeletal:  Negative for back pain, falls and myalgias.  Skin:  Negative for rash.  Neurological:  Positive for headaches. Negative for dizziness, sensory change, loss of consciousness and weakness.  Endo/Heme/Allergies:  Negative for environmental allergies. Does not bruise/bleed easily.  Psychiatric/Behavioral:  Negative for depression and suicidal ideas. The patient is not nervous/anxious and does not have insomnia.        Objective:    Physical Exam Constitutional:      General: She is not in acute distress.    Appearance: Normal appearance. She is not diaphoretic.  HENT:     Head: Normocephalic and atraumatic.     Right Ear: Tympanic membrane, ear canal and external ear normal.     Left Ear: Tympanic membrane, ear canal and external ear normal.     Nose: Nose normal.     Mouth/Throat:     Mouth: Mucous membranes are moist.     Pharynx: Posterior oropharyngeal erythema present. No oropharyngeal exudate.  Eyes:     General: No scleral icterus.       Right eye: No discharge.  Left eye: No discharge.     Conjunctiva/sclera: Conjunctivae normal.     Pupils: Pupils are equal, round, and reactive to light.  Neck:     Thyroid: No thyromegaly.  Cardiovascular:     Rate and Rhythm: Normal rate and regular rhythm.     Heart sounds: Normal heart sounds. No murmur  heard. Pulmonary:     Effort: Pulmonary effort is normal. No respiratory distress.     Breath sounds: Normal breath sounds. No wheezing or rales.  Abdominal:     General: Bowel sounds are normal. There is no distension.     Palpations: Abdomen is soft. There is no mass.     Tenderness: There is no abdominal tenderness.  Musculoskeletal:        General: No tenderness. Normal range of motion.     Cervical back: Normal range of motion and neck supple.  Lymphadenopathy:     Cervical: No cervical adenopathy.  Skin:    General: Skin is warm and dry.     Findings: No rash.  Neurological:     General: No focal deficit present.     Mental Status: She is alert and oriented to person, place, and time.     Cranial Nerves: No cranial nerve deficit.     Coordination: Coordination normal.     Deep Tendon Reflexes: Reflexes are normal and symmetric. Reflexes normal.  Psychiatric:        Mood and Affect: Mood normal.        Behavior: Behavior normal.        Thought Content: Thought content normal.        Judgment: Judgment normal.     BP 132/78 (BP Location: Left Arm, Patient Position: Sitting, Cuff Size: Normal)   Pulse 64   Temp 98.8 F (37.1 C) (Oral)   Resp 16   Ht 5\' 3"  (1.6 m)   Wt 125 lb (56.7 kg)   LMP 02/08/1997   SpO2 99%   BMI 22.14 kg/m  Wt Readings from Last 3 Encounters:  04/07/23 125 lb (56.7 kg)  03/17/23 119 lb (54 kg)  03/11/23 122 lb 12.8 oz (55.7 kg)    Diabetic Foot Exam - Simple   No data filed    Lab Results  Component Value Date   WBC 4.5 04/07/2023   HGB 13.1 04/07/2023   HCT 40.3 04/07/2023   PLT 203.0 04/07/2023   GLUCOSE 72 04/07/2023   CHOL 202 (H) 04/07/2023   TRIG 153.0 (H) 04/07/2023   HDL 64.00 04/07/2023   LDLDIRECT 50.0 11/14/2020   LDLCALC 107 (H) 04/07/2023   ALT 16 04/07/2023   AST 16 04/07/2023   NA 140 04/07/2023   K 4.3 04/07/2023   CL 106 04/07/2023   CREATININE 0.83 04/07/2023   BUN 14 04/07/2023   CO2 30 04/07/2023    TSH 1.04 04/07/2023   HGBA1C 5.5 04/07/2023    Lab Results  Component Value Date   TSH 1.04 04/07/2023   Lab Results  Component Value Date   WBC 4.5 04/07/2023   HGB 13.1 04/07/2023   HCT 40.3 04/07/2023   MCV 103.2 (H) 04/07/2023   PLT 203.0 04/07/2023   Lab Results  Component Value Date   NA 140 04/07/2023   K 4.3 04/07/2023   CO2 30 04/07/2023   GLUCOSE 72 04/07/2023   BUN 14 04/07/2023   CREATININE 0.83 04/07/2023   BILITOT 0.5 04/07/2023   ALKPHOS 42 04/07/2023   AST 16 04/07/2023   ALT  16 04/07/2023   PROT 6.4 04/07/2023   ALBUMIN 3.9 04/07/2023   CALCIUM 9.1 04/07/2023   ANIONGAP 6 10/03/2022   GFR 71.34 04/07/2023   Lab Results  Component Value Date   CHOL 202 (H) 04/07/2023   Lab Results  Component Value Date   HDL 64.00 04/07/2023   Lab Results  Component Value Date   LDLCALC 107 (H) 04/07/2023   Lab Results  Component Value Date   TRIG 153.0 (H) 04/07/2023   Lab Results  Component Value Date   CHOLHDL 3 04/07/2023   Lab Results  Component Value Date   HGBA1C 5.5 04/07/2023       Assessment & Plan:  Hyperlipidemia, unspecified hyperlipidemia type Assessment & Plan: Encourage heart healthy diet such as MIND or DASH diet, increase exercise, avoid trans fats, simple carbohydrates and processed foods, consider a krill or fish or flaxseed oil cap daily.   Orders: -     Lipid panel  Primary hypertension Assessment & Plan: Monitor and report any concerns, no changes to meds. Encouraged heart healthy diet such as the DASH diet and exercise as tolerated.   Orders: -     CBC with Differential/Platelet -     Comprehensive metabolic panel -     TSH  Irritable bowel syndrome with both constipation and diarrhea Assessment & Plan: Encouraged increased hydration and fiber in diet. Daily probiotics.    Mild cognitive impairment with memory loss Assessment & Plan: Continue to stay active, learn something new, maintain a heart healthy diet  and hydrate well   Moderate persistent asthma without complication Assessment & Plan: No recent exacerbation   Vitamin B 12 deficiency Assessment & Plan: Supplement and monitor   Orders: -     Vitamin B12  Preventative health care Assessment & Plan: Patient encouraged to maintain heart healthy diet, regular exercise, adequate sleep. Consider daily probiotics. Take medications as prescribed. Labs ordered and reviewed. Given and reviewed copy of ACP documents from [facility] Secretary of [phone] and encouraged to complete and return  Colonoscopy at [facility] 2022 Pap  Mgm 04/15/2022, odered Dexa 11/2020 normal repeat in 2027   Hyperglycemia, unspecified -     Hemoglobin A1c  Vitamin D deficiency -     VITAMIN D 25 Hydroxy (Vit-D Deficiency, Fractures)  Encounter for screening for malignant neoplasm of breast, unspecified screening modality -     3D Screening Mammogram, Left and Right; Future  Other orders -     Azithromycin; Take 2 tablets on day 1, then 1 tablet daily on days 2 through 5  Dispense: 6 tablet; Refill: 0    Assessment and Plan Assessment & Plan Pharyngitis Severe sore throat with headache and decreased appetite. Swabbed for strep, flu, and COVID. Suspicion for strep based on symptoms. -If strep test positive, start Azithromycin (Z-Pak) for 5 days. -If strep test negative, consider starting Azithromycin based on severity of symptoms.  Chronic Diarrhea Multiple episodes daily, ongoing for an extended period. No significant pain, blood, or fever. History of colonoscopy in 2022 with removal of two adenomatous polyps. -Consider repeat colonoscopy at 3-year mark (2025) if symptoms persist or worsen. -Advise on dietary modifications to minimize processed foods.  General Health Maintenance -Mammogram due in March 2025. Order can be placed for any time after March 8th, 2025. -DEXA scan normal in October 2022. Repeat in 2025. -Consider Respiratory Syncytial Virus (RSV)  vaccination at pharmacy. -Check status of colonoscopy follow-up based on 2022 results (removal of two adenomatous polyps).  Danise Edge, MD

## 2023-04-15 ENCOUNTER — Other Ambulatory Visit: Payer: Self-pay | Admitting: Family

## 2023-04-15 MED ORDER — BUSPIRONE HCL 5 MG PO TABS: 5.0000 mg | ORAL_TABLET | Freq: Three times a day (TID) | ORAL | 1 refills | Status: DC

## 2023-04-18 ENCOUNTER — Encounter (HOSPITAL_BASED_OUTPATIENT_CLINIC_OR_DEPARTMENT_OTHER): Payer: Self-pay

## 2023-04-18 ENCOUNTER — Ambulatory Visit (HOSPITAL_BASED_OUTPATIENT_CLINIC_OR_DEPARTMENT_OTHER)
Admission: RE | Admit: 2023-04-18 | Discharge: 2023-04-18 | Disposition: A | Discharge status: Home / Self Care | Payer: Medicare PPO | Source: Ambulatory Visit | Attending: Family Medicine | Admitting: Family Medicine

## 2023-04-18 DIAGNOSIS — Z1239 Encounter for other screening for malignant neoplasm of breast: Secondary | ICD-10-CM

## 2023-04-18 DIAGNOSIS — Z1231 Encounter for screening mammogram for malignant neoplasm of breast: Secondary | ICD-10-CM | POA: Insufficient documentation

## 2023-04-20 ENCOUNTER — Encounter (HOSPITAL_BASED_OUTPATIENT_CLINIC_OR_DEPARTMENT_OTHER): Payer: Self-pay | Admitting: Pulmonary Disease

## 2023-04-20 ENCOUNTER — Ambulatory Visit (HOSPITAL_BASED_OUTPATIENT_CLINIC_OR_DEPARTMENT_OTHER): Payer: Medicare PPO | Admitting: Pulmonary Disease

## 2023-04-20 ENCOUNTER — Institutional Professional Consult (permissible substitution) (HOSPITAL_BASED_OUTPATIENT_CLINIC_OR_DEPARTMENT_OTHER): Payer: Medicare PPO | Admitting: Pulmonary Disease

## 2023-04-20 ENCOUNTER — Encounter (HOSPITAL_BASED_OUTPATIENT_CLINIC_OR_DEPARTMENT_OTHER): Payer: Self-pay

## 2023-04-27 ENCOUNTER — Encounter: Payer: Self-pay | Admitting: Family Medicine

## 2023-04-28 ENCOUNTER — Other Ambulatory Visit: Payer: Self-pay | Admitting: Family Medicine

## 2023-04-28 DIAGNOSIS — J4541 Moderate persistent asthma with (acute) exacerbation: Secondary | ICD-10-CM

## 2023-04-28 MED ORDER — ALBUTEROL SULFATE (2.5 MG/3ML) 0.083% IN NEBU: 2.5000 mg | INHALATION_SOLUTION | Freq: Four times a day (QID) | RESPIRATORY_TRACT | 1 refills | Status: DC | PRN

## 2023-05-04 ENCOUNTER — Institutional Professional Consult (permissible substitution): Payer: Medicare PPO | Admitting: Psychology

## 2023-05-04 ENCOUNTER — Ambulatory Visit: Payer: Self-pay

## 2023-05-05 ENCOUNTER — Other Ambulatory Visit: Payer: Self-pay | Admitting: Family

## 2023-05-05 MED ORDER — DOXYCYCLINE HYCLATE 100 MG PO TABS: 100.0000 mg | ORAL_TABLET | Freq: Two times a day (BID) | ORAL | 0 refills | Status: DC

## 2023-05-11 ENCOUNTER — Other Ambulatory Visit (HOSPITAL_BASED_OUTPATIENT_CLINIC_OR_DEPARTMENT_OTHER): Payer: Self-pay

## 2023-05-12 ENCOUNTER — Encounter: Payer: Medicare PPO | Admitting: Psychology

## 2023-05-17 ENCOUNTER — Ambulatory Visit (INDEPENDENT_AMBULATORY_CARE_PROVIDER_SITE_OTHER): Admitting: Physician Assistant

## 2023-05-17 ENCOUNTER — Encounter: Payer: Self-pay | Admitting: Physician Assistant

## 2023-05-17 ENCOUNTER — Ambulatory Visit: Payer: Self-pay

## 2023-05-17 VITALS — BP 107/71 | HR 73 | Temp 97.7°F | Wt 120.0 lb

## 2023-05-17 DIAGNOSIS — D869 Sarcoidosis, unspecified: Secondary | ICD-10-CM | POA: Diagnosis not present

## 2023-05-17 DIAGNOSIS — J4541 Moderate persistent asthma with (acute) exacerbation: Secondary | ICD-10-CM

## 2023-05-17 MED ORDER — PREDNISONE 10 MG PO TABS: ORAL_TABLET | ORAL | 0 refills | Status: DC

## 2023-05-17 MED ORDER — ALBUTEROL SULFATE (2.5 MG/3ML) 0.083% IN NEBU: 2.5000 mg | INHALATION_SOLUTION | Freq: Four times a day (QID) | RESPIRATORY_TRACT | 1 refills | Status: DC | PRN

## 2023-05-17 MED ORDER — DOXYCYCLINE HYCLATE 100 MG PO TABS: 100.0000 mg | ORAL_TABLET | Freq: Two times a day (BID) | ORAL | 0 refills | Status: AC

## 2023-05-17 NOTE — Telephone Encounter (Signed)
 Copied from CRM [id number]. Topic: Clinical - Red Word Triage >> May 17, 2023  9:47 AM Theodis Sato wrote: Red Word that prompted transfer to Nurse Triage: Patient states she is coughing up green mucus and having shortness of breath.   E2C2 Pulmonary Triage - Initial Assessment Questions "Chief Complaint (e.g., cough, sob, wheezing, fever, chills, sweat or additional symptoms) *Go to specific symptom protocol after initial questions. Cough-Productive, Dyspnea  "How long have symptoms been present?" More than a week  Have you tested for COVID or Flu? Note: If not, ask patient if a home test can be taken. If so, instruct patient to call back for positive results. No  MEDICINES:   "Have you used any OTC meds to help with symptoms?" No If yes, ask "What medications?" No  "Have you used your inhalers/maintenance medication?" Yes If yes, "What medications?" Nebulizer,   If inhaler, ask "How many puffs and how often?" Note: Review instructions on medication in the chart. Yes, PRN Rescue inhaler  OXYGEN: "Do you wear supplemental oxygen?" No If yes, "How many liters are you supposed to use?" No  "Do you monitor your oxygen levels?" No  If yes, "What is your reading (oxygen level) today?" Unsure  "What is your usual oxygen saturation reading?"  (Note: Pulmonary O2 sats should be 90% or greater) Unsure  Reason for Disposition  [1] MILD difficulty breathing (e.g., minimal/no SOB at rest, SOB with walking, pulse <100) AND [2] still present when not coughing  Answer Assessment - Initial Assessment Questions 1. ONSET: "When did the cough begin?"      More than a week  2. SEVERITY: "How bad is the cough today?"      Intermittent  3. SPUTUM: "Describe the color of your sputum" (none, dry cough; clear, white, yellow, green)     Green sputum  4. HEMOPTYSIS: "Are you coughing up any blood?" If so ask: "How much?" (flecks, streaks, tablespoons, etc.)     No  5. DIFFICULTY BREATHING:  "Are you having difficulty breathing?" If Yes, ask: "How bad is it?" (e.g., mild, moderate, severe)    - MILD: No SOB at rest, mild SOB with walking, speaks normally in sentences, can lie down, no retractions, pulse < 100.    - MODERATE: SOB at rest, SOB with minimal exertion and prefers to sit, cannot lie down flat, speaks in phrases, mild retractions, audible wheezing, pulse 100-120.    - SEVERE: Very SOB at rest, speaks in single words, struggling to breathe, sitting hunched forward, retractions, pulse > 120      Mild to Moderate  6. FEVER: "Do you have a fever?" If Yes, ask: "What is your temperature, how was it measured, and when did it start?"     No  7. CARDIAC HISTORY: "Do you have any history of heart disease?" (e.g., heart attack, congestive heart failure)      Did not answer  8. LUNG HISTORY: "Do you have any history of lung disease?"  (e.g., pulmonary embolus, asthma, emphysema)     Asthma  9. PE RISK FACTORS: "Do you have a history of blood clots?" (or: recent major surgery, recent prolonged travel, bedridden)     Did not answer  10. OTHER SYMPTOMS: "Do you have any other symptoms?" (e.g., runny nose, wheezing, chest pain)       Dyspnea, Runny Nose  11. PREGNANCY: "Is there any chance you are pregnant?" "When was your last menstrual period?"       No and no  12. TRAVEL: "Have you traveled out of the country in the last month?" (e.g., travel history, exposures)       No  Protocols used: Cough - Acute Productive-A-AH

## 2023-05-17 NOTE — Progress Notes (Signed)
 Established patient visit   Patient: Casey Rowe   DOB: 01-27-1951   74 y.o. Female  MRN: [id number] Visit Date: 05/17/2023  Today's healthcare provider: Alfredia Ferguson, PA-C   Cc. Worsening cough, sob, fatigue  Subjective     Pt reports worsening of her chronic cough and SOB over the last few days, using her nebulizer every 6 hours without improvement. Reports feeling very weak, no appetite.   Medications: Outpatient Medications Prior to Visit  Medication Sig   albuterol (VENTOLIN HFA) 108 (90 Base) MCG/ACT inhaler Inhale 1-2 puffs into the lungs every 4 (four) hours as needed for wheezing or shortness of breath.   azelastine (ASTELIN) 0.1 % nasal spray Place 2 sprays into both nostrils 2 (two) times daily. Use in each nostril as directed   benzonatate (TESSALON) 100 MG capsule Take 1 capsule (100 mg total) by mouth 2 (two) times daily as needed.   buPROPion (WELLBUTRIN XL) 300 MG 24 hr tablet Take 300 mg by mouth daily.   busPIRone (BUSPAR) 5 MG tablet Take 1 tablet (5 mg total) by mouth 3 (three) times daily.   fenofibrate micronized (LOFIBRA) 134 MG capsule TAKE ONE CAPSULE BY MOUTH EVERY MORNING BEFORE BREAKFAST   fluticasone-salmeterol (ADVAIR DISKUS) 250-50 MCG/ACT AEPB Inhale 1 puff into the lungs in the morning and at bedtime.   gabapentin (NEURONTIN) 300 MG capsule Take 300 mg by mouth 3 (three) times daily.   losartan (COZAAR) 50 MG tablet TAKE 1 TABLET BY MOUTH DAILY   metoprolol succinate (TOPROL-XL) 50 MG 24 hr tablet TAKE 1 TABLET BY MOUTH DAILY WITH A MEAL   Nebulizer MISC Use w/ nebulized medication q 6 hours   promethazine-dextromethorphan (PROMETHAZINE-DM) 6.25-15 MG/5ML syrup Take 5 mLs by mouth 4 (four) times daily as needed for cough. (Patient not taking: Reported on 05/17/2023)   Respiratory Therapy Supplies (NEBULIZER MASK ADULT/TUBING) MISC 1 each by Does not apply route every 6 (six) hours. Use w/ nebulized med q 6 hours   [DISCONTINUED] albuterol  (PROVENTIL) (2.5 MG/3ML) 0.083% nebulizer solution Take 3 mLs (2.5 mg total) by nebulization every 6 (six) hours as needed for wheezing or shortness of breath.   [DISCONTINUED] doxycycline (VIBRA-TABS) 100 MG tablet Take 1 tablet (100 mg total) by mouth 2 (two) times daily.   [DISCONTINUED] predniSONE (DELTASONE) 10 MG tablet Take 4 tabs  daily with food x 4 days, then 3 tabs daily x 4 days, then 2 tabs daily x 4 days, then 1 tab daily x4 days then stop. #40   No facility-administered medications prior to visit.    Review of Systems  Constitutional:  Negative for fatigue and fever.  Respiratory:  Positive for cough, shortness of breath and wheezing.   Cardiovascular:  Negative for chest pain and leg swelling.  Gastrointestinal:  Negative for abdominal pain.  Neurological:  Negative for dizziness and headaches.       Objective    BP 107/71   Pulse 73   Temp 97.7 F (36.5 C)   Wt 120 lb (54.4 kg)   LMP 02/08/1997   SpO2 98%   BMI 21.26 kg/m    Physical Exam Constitutional:      General: She is awake.     Appearance: She is well-developed. She is ill-appearing.  HENT:     Head: Normocephalic.  Eyes:     Conjunctiva/sclera: Conjunctivae normal.  Cardiovascular:     Rate and Rhythm: Normal rate and regular rhythm.     Heart  sounds: Normal heart sounds.  Pulmonary:     Effort: Pulmonary effort is normal. No respiratory distress.     Breath sounds: Wheezing present.  Skin:    General: Skin is warm.  Neurological:     Mental Status: She is alert and oriented to person, place, and time.  Psychiatric:        Attention and Perception: Attention normal.        Mood and Affect: Mood normal.        Speech: Speech normal.        Behavior: Behavior is cooperative.     No results found for any visits on 05/17/23.  Assessment & Plan    Sarcoidosis -     Doxycycline Hyclate; Take 1 tablet (100 mg total) by mouth 2 (two) times daily for 7 days.  Dispense: 14 tablet; Refill: 0 -      Albuterol Sulfate; Take 3 mLs (2.5 mg total) by nebulization every 6 (six) hours as needed for wheezing or shortness of breath.  Dispense: 468 mL; Refill: 1 -     predniSONE; Take 4 tabs  daily with food x 4 days, then 3 tabs daily x 4 days, then 2 tabs daily x 4 days, then 1 tab daily x4 days then stop. #40  Dispense: 40 tablet; Refill: 0  Moderate persistent asthma with exacerbation -     Albuterol Sulfate; Take 3 mLs (2.5 mg total) by nebulization every 6 (six) hours as needed for wheezing or shortness of breath.  Dispense: 468 mL; Refill: 1 -     predniSONE; Take 4 tabs  daily with food x 4 days, then 3 tabs daily x 4 days, then 2 tabs daily x 4 days, then 1 tab daily x4 days then stop. #40  Dispense: 40 tablet; Refill: 0   Exacerbation of asthma/sarcoid. Pt no showed pulm appt, reports rescheduling to later this month.  Vitals are stable- -significant wheezing. Advised nebs q 6 hours, rx steroid taper, doxycycline.  Discussed with patient the extent of her weakness- that outpatient treatment may not be adequate. She does not want to go to the ED today. If symptoms worsen, recommend going to ED.  Return if symptoms worsen or fail to improve.      Alfredia Ferguson, PA-C  [facility] at [facility] [phone] (phone) [phone] (fax)  [facility]

## 2023-05-18 ENCOUNTER — Emergency Department (HOSPITAL_COMMUNITY)

## 2023-05-18 ENCOUNTER — Emergency Department (HOSPITAL_COMMUNITY)
Admission: EM | Admit: 2023-05-18 | Discharge: 2023-05-18 | Discharge status: Against Medical Advice | Attending: Emergency Medicine | Admitting: Emergency Medicine

## 2023-05-18 ENCOUNTER — Other Ambulatory Visit: Payer: Self-pay

## 2023-05-18 ENCOUNTER — Encounter: Payer: Self-pay | Admitting: Physician Assistant

## 2023-05-18 ENCOUNTER — Encounter (HOSPITAL_COMMUNITY): Payer: Self-pay

## 2023-05-18 DIAGNOSIS — Z5321 Procedure and treatment not carried out due to patient leaving prior to being seen by health care provider: Secondary | ICD-10-CM

## 2023-05-18 DIAGNOSIS — R0602 Shortness of breath: Secondary | ICD-10-CM | POA: Insufficient documentation

## 2023-05-18 DIAGNOSIS — I10 Essential (primary) hypertension: Secondary | ICD-10-CM | POA: Insufficient documentation

## 2023-05-18 DIAGNOSIS — R059 Cough, unspecified: Secondary | ICD-10-CM | POA: Diagnosis not present

## 2023-05-18 DIAGNOSIS — R053 Chronic cough: Secondary | ICD-10-CM | POA: Diagnosis not present

## 2023-05-18 DIAGNOSIS — Z5329 Procedure and treatment not carried out because of patient's decision for other reasons: Secondary | ICD-10-CM | POA: Insufficient documentation

## 2023-05-18 DIAGNOSIS — R0789 Other chest pain: Secondary | ICD-10-CM | POA: Diagnosis not present

## 2023-05-18 LAB — CBC
HCT: 42.6 % (ref 36.0–46.0)
Hemoglobin: 13.3 g/dL (ref 12.0–15.0)
MCH: 32.1 pg (ref 26.0–34.0)
MCHC: 31.2 g/dL (ref 30.0–36.0)
MCV: 102.9 fL — ABNORMAL HIGH (ref 80.0–100.0)
Platelets: 350 10*3/uL (ref 150–400)
RBC: 4.14 MIL/uL (ref 3.87–5.11)
RDW: 12.3 % (ref 11.5–15.5)
WBC: 7 10*3/uL (ref 4.0–10.5)
nRBC: 0 % (ref 0.0–0.2)

## 2023-05-18 LAB — TROPONIN I (HIGH SENSITIVITY)
Troponin I (High Sensitivity): 5 ng/L (ref <18)
Troponin I (High Sensitivity): 4 ng/L (ref <18)

## 2023-05-18 LAB — BASIC METABOLIC PANEL WITH GFR
Anion gap: 7 (ref 5–15)
BUN: 29 mg/dL — ABNORMAL HIGH (ref 8–23)
CO2: 23 mmol/L (ref 22–32)
Calcium: 9.6 mg/dL (ref 8.9–10.3)
Chloride: 102 mmol/L (ref 98–111)
Creatinine, Ser: 1.06 mg/dL — ABNORMAL HIGH (ref 0.44–1.00)
GFR, Estimated: 57 mL/min — ABNORMAL LOW (ref >60)
Glucose, Bld: 125 mg/dL — ABNORMAL HIGH (ref 70–99)
Potassium: 4.1 mmol/L (ref 3.5–5.1)
Sodium: 132 mmol/L — ABNORMAL LOW (ref 135–145)

## 2023-05-18 LAB — URINALYSIS, ROUTINE W REFLEX MICROSCOPIC
Bacteria, UA: NONE SEEN
Bilirubin Urine: NEGATIVE
Glucose, UA: NEGATIVE mg/dL
Hgb urine dipstick: NEGATIVE
Ketones, ur: NEGATIVE mg/dL
Nitrite: NEGATIVE
Protein, ur: NEGATIVE mg/dL
Specific Gravity, Urine: 1.011 (ref 1.005–1.030)
pH: 6 (ref 5.0–8.0)

## 2023-05-18 LAB — D-DIMER, QUANTITATIVE
D-Dimer, Quant: 0.61 ug{FEU}/mL — ABNORMAL HIGH (ref 0.00–0.50)
D-Dimer, Quant: 0.69 ug{FEU}/mL — ABNORMAL HIGH (ref 0.00–0.50)

## 2023-05-18 MED ORDER — IPRATROPIUM-ALBUTEROL 0.5-2.5 (3) MG/3ML IN SOLN
3.0000 mL | Freq: Once | RESPIRATORY_TRACT | Status: AC
  Administered 2023-05-18: 3 mL via RESPIRATORY_TRACT
  Filled 2023-05-18: qty 3

## 2023-05-18 MED ORDER — IOHEXOL 350 MG/ML SOLN
75.0000 mL | Freq: Once | INTRAVENOUS | Status: AC | PRN
  Administered 2023-05-18: 75 mL via INTRAVENOUS

## 2023-05-18 NOTE — ED Provider Notes (Signed)
 [place] EMERGENCY DEPARTMENT AT [facility] Provider Note  CSN: [id number] Arrival date & time: 05/18/23 1328  Chief Complaint(s) Cough  HPI Casey Rowe is a 73 y.o. female history of sarcoidosis, asthma presenting to the emergency department with shortness of breath.  Patient reports chronic cough for the past 3 to 4 months.  Has seen pulmonology and primary doctor for this.  Has been on steroids and antibiotics.  Reports none of this is really helped.  She reports that she did have some increased shortness of breath from baseline yesterday.  She also reports some vague chest pain, patient unsure if it is pleuritic.  No leg swelling.  No fevers or chills.  Reports cough productive of green phlegm.  No abdominal pain, nausea, vomiting.  No fevers.   Past Medical History Past Medical History:  Diagnosis Date   Abdominal pain 08/08/2020   Acute nonsuppurative otitis media of left ear 12/22/2014   Allergy    Anorexia 01/14/2021   600 calories/daily   Asthma exacerbation 02/22/2012   Broken jaw    1980s; fall down flight of stairs   Callus of foot 12/28/2017   Coccydynia 04/08/2020   Concussion 06/2022   Fall at home    Conjunctivitis 04/04/2022   Fall 03/27/2017   At work on November 11, 2016, following with workman's comp  Last Assessment & Plan:   Larey Seat at work on 11/09/16 and has been following with [employer]. She   fell up some steps onto her knees and has been left with chronic pain.     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   Palpitation 03/24/2017   Rosacea 03/17/2020   Sarcoidosis    Sinusitis 01/18/2011   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   Vitamin B 12 deficiency 06/23/2022   Weight loss 11/07/2013   Patient Active Problem List   Diagnosis Date Noted   Mild cognitive impairment with memory loss 11/02/2022   Vitamin B 12 deficiency 06/23/2022   Hematoma 06/23/2022   Concussion 06/2022   Conjunctivitis 04/04/2022   Decreased visual acuity 02/16/2022   Macrocytic anemia 02/15/2022   Moderate persistent asthma without complication 06/11/2021   Anorexia 01/14/2021   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 Authorizing Provider  albuterol (PROVENTIL) (2.5 MG/3ML) 0.083% nebulizer solution Take 3 mLs (2.5 mg total) by nebulization every 6 (six) hours as  needed for wheezing or shortness of breath. 05/17/23 06/16/23  Alfredia Ferguson, PA-C  albuterol (VENTOLIN HFA) 108 (90 Base) MCG/ACT inhaler Inhale 1-2 puffs into the lungs every 4 (four) hours as needed for wheezing or shortness of breath. 03/08/23   Bradd Canary, MD  azelastine (ASTELIN) 0.1 % nasal spray Place 2 sprays into both nostrils 2 (two) times daily. Use in each nostril as directed 03/16/16   Bradd Canary, MD  benzonatate (TESSALON) 100 MG capsule  Take 1 capsule (100 mg total) by mouth 2 (two) times daily as needed. 03/11/23   Alfredia Ferguson, PA-C  buPROPion (WELLBUTRIN XL) 300 MG 24 hr tablet Take 300 mg by mouth daily.    [provider]  busPIRone (BUSPAR) 5 MG tablet Take 1 tablet (5 mg total) by mouth 3 (three) times daily. 04/15/23   Eulis Foster, FNP  doxycycline (VIBRA-TABS) 100 MG tablet Take 1 tablet (100 mg total) by mouth 2 (two) times daily for 7 days. 05/17/23 05/24/23  Alfredia Ferguson, PA-C  fenofibrate micronized (LOFIBRA) 134 MG capsule TAKE ONE CAPSULE BY MOUTH EVERY MORNING BEFORE BREAKFAST 03/07/23   Bradd Canary, MD  fluticasone-salmeterol (ADVAIR DISKUS) 250-50 MCG/ACT AEPB Inhale 1 puff into the lungs in the morning and at bedtime. 01/04/22   Luciano Cutter, MD  gabapentin (NEURONTIN) 300 MG capsule Take 300 mg by mouth 3 (three) times daily. 10/18/22   [provider]  losartan (COZAAR) 50 MG tablet TAKE 1 TABLET BY MOUTH DAILY 03/10/23   Bradd Canary, MD  metoprolol succinate (TOPROL-XL) 50 MG 24 hr tablet TAKE 1 TABLET BY MOUTH DAILY WITH A MEAL 03/09/23   Bradd Canary, MD  Nebulizer MISC Use w/ nebulized medication q 6 hours 03/11/23   Drubel, Lillia Abed, PA-C  predniSONE (DELTASONE) 10 MG tablet Take 4 tabs  daily with food x 4 days, then 3 tabs daily x 4 days, then 2 tabs daily x 4 days, then 1 tab daily x4 days then stop. #40 05/17/23   Alfredia Ferguson, PA-C  promethazine-dextromethorphan (PROMETHAZINE-DM) 6.25-15 MG/5ML syrup Take 5 mLs by mouth 4 (four) times daily as needed for cough. Patient not taking: Reported on 05/17/2023 03/24/23   Alfredia Ferguson, PA-C  Respiratory Therapy Supplies (NEBULIZER MASK ADULT/TUBING) MISC 1 each by Does not apply route every 6 (six) hours. Use w/ nebulized med q 6 hours 03/11/23   Alfredia Ferguson, PA-C                                                                                                                                    Past Surgical History Past  Surgical History:  Procedure Laterality Date   ABDOMINAL HYSTERECTOMY     CESAREAN SECTION     ECTOPIC PREGNANCY SURGERY  1981   FRACTURE SURGERY  1980s   broken jaw   Family History Family History  Problem Relation Age of Onset  Hypertension Mother    Dementia Mother        multi-infarct   Diabetes Mother    Diabetes Father    Heart attack Father    Heart disease Father    Psoriasis Father    Cancer Sister        pancreatic   Alcohol abuse Sister        substance   Hepatitis B Brother    Gout Brother    Hypertension Brother    Glaucoma Brother    Heart disease Brother    Rashes / Skin problems Son    Other Son        bad allergies, aspirin, dermatitis   Rashes / Skin problems Son    Cancer Maternal Aunt        breast   Breast cancer Maternal Aunt     Social History Social History   Tobacco Use   Smoking status: Never   Smokeless tobacco: Never  Vaping Use   Vaping status: Never Used  Substance Use Topics   Alcohol use: Yes    Alcohol/week: 14.0 - 21.0 standard drinks of alcohol    Types: 14 - 21 Glasses of wine per week    Comment: couple glasses of wine "a lot of nights"   Drug use: No   Allergies Demerol, Singulair [montelukast], and Augmentin [amoxicillin-pot clavulanate]  Review of Systems Review of Systems  All other systems reviewed and are negative.   Physical Exam Vital Signs  I have reviewed the triage vital signs BP (!) 152/92   Pulse 67   Temp 98 F (36.7 C) (Oral)   Resp 20   Ht 5\' 3"  (1.6 m)   Wt 54.4 kg   LMP 02/08/1997   SpO2 95%   BMI 21.24 kg/m  Physical Exam Vitals and nursing note reviewed.  Constitutional:      General: She is not in acute distress.    Appearance: She is well-developed.  HENT:     Head: Normocephalic and atraumatic.     Mouth/Throat:     Mouth: Mucous membranes are moist.  Eyes:     Pupils: Pupils are equal, round, and reactive to light.  Cardiovascular:     Rate and Rhythm: Normal rate and  regular rhythm.     Heart sounds: No murmur heard. Pulmonary:     Effort: Pulmonary effort is normal. No respiratory distress.     Breath sounds: Wheezing (trace) present.  Abdominal:     General: Abdomen is flat.     Palpations: Abdomen is soft.     Tenderness: There is no abdominal tenderness.  Musculoskeletal:        General: No tenderness.     Right lower leg: No edema.     Left lower leg: No edema.  Skin:    General: Skin is warm and dry.  Neurological:     General: No focal deficit present.     Mental Status: She is alert. Mental status is at baseline.  Psychiatric:        Mood and Affect: Mood is anxious. Affect is tearful.        Behavior: Behavior normal.     ED Results and Treatments Labs (all labs ordered are listed, but only abnormal results are displayed) Labs Reviewed  BASIC METABOLIC PANEL WITH GFR - Abnormal; Notable for the following components:      Result Value   Sodium 132 (*)    Glucose, Bld 125 (*)  BUN 29 (*)    Creatinine, Ser 1.06 (*)    GFR, Estimated 57 (*)    All other components within normal limits  CBC - Abnormal; Notable for the following components:   MCV 102.9 (*)    All other components within normal limits  URINALYSIS, ROUTINE W REFLEX MICROSCOPIC - Abnormal; Notable for the following components:   Leukocytes,Ua TRACE (*)    All other components within normal limits  D-DIMER, QUANTITATIVE (NOT AT [facility]) - Abnormal; Notable for the following components:   D-Dimer, Quant 0.61 (*)    All other components within normal limits  D-DIMER, QUANTITATIVE (NOT AT [facility]) - Abnormal; Notable for the following components:   D-Dimer, Quant 0.69 (*)    All other components within normal limits  TROPONIN I (HIGH SENSITIVITY)  TROPONIN I (HIGH SENSITIVITY)                                                                                                                          Radiology DG Chest 2 View Result Date: 05/18/2023 CLINICAL DATA:  Cough  and shortness of breath. EXAM: CHEST - 2 VIEW COMPARISON:  03/11/2023 FINDINGS: The cardiomediastinal contours are normal. The lungs are clear. Pulmonary vasculature is normal. No consolidation, pleural effusion, or pneumothorax. No acute osseous abnormalities are seen. IMPRESSION: No active cardiopulmonary disease. Electronically Signed   By: Narda Rutherford M.D.   On: 05/18/2023 16:16    Pertinent labs & imaging results that were available during my care of the patient were reviewed by me and considered in my medical decision making (see MDM for details).  Medications Ordered in ED Medications  ipratropium-albuterol (DUONEB) 0.5-2.5 (3) MG/3ML nebulizer solution 3 mL (3 mLs Nebulization Given 05/18/23 1650)  iohexol (OMNIPAQUE) 350 MG/ML injection 75 mL (75 mLs Intravenous Contrast Given 05/18/23 1925)                                                                                                                                     Procedures Procedures  (including critical care time)  Medical Decision Making / ED Course   MDM:  73 year old presenting to the emergency department with chronic cough.  Patient overall well-appearing, physical examination with trace wheeze, patient appears very anxious.  Seems most consistent with chronic bronchitis.  She does have some trace wheezing, will give a DuoNeb but patient has already been prescribed steroids and  antibiotics by her primary doctor on chart review.  Also reports chest pain, doubt ACS but will check troponin given age.  Lower concern for pulmonary embolism without any clear risk factors, but given her worsening symptoms, will check D-dimer.  Chest x-ray appears clear on my interpretation.  Labs overall with minimal AKI.  Wonder if some of patient's symptoms are related to anxiety.  If her D-dimer and troponin are negative, anticipate discharge.  Clinical Course as of 05/18/23 2059  Wed May 18, 2023  2056 Patient's D-dimer was elevated,  troponin negative.  Plan to obtain CT chest which was obtained.  However, patient eloped prior to the results of this testing.  Nurse did not find the patient in the room. [WS]    Clinical Course User Index [WS] Lonell Grandchild, MD     Additional history obtained: -Additional history obtained from spouse -External records from outside source obtained and reviewed including: Chart review including previous notes, labs, imaging, consultation notes including prior notes    Lab Tests: -I ordered, reviewed, and interpreted labs.   The pertinent results include:   Labs Reviewed  BASIC METABOLIC PANEL WITH GFR - Abnormal; Notable for the following components:      Result Value   Sodium 132 (*)    Glucose, Bld 125 (*)    BUN 29 (*)    Creatinine, Ser 1.06 (*)    GFR, Estimated 57 (*)    All other components within normal limits  CBC - Abnormal; Notable for the following components:   MCV 102.9 (*)    All other components within normal limits  URINALYSIS, ROUTINE W REFLEX MICROSCOPIC - Abnormal; Notable for the following components:   Leukocytes,Ua TRACE (*)    All other components within normal limits  D-DIMER, QUANTITATIVE (NOT AT [facility]) - Abnormal; Notable for the following components:   D-Dimer, Quant 0.61 (*)    All other components within normal limits  D-DIMER, QUANTITATIVE (NOT AT [facility]) - Abnormal; Notable for the following components:   D-Dimer, Quant 0.69 (*)    All other components within normal limits  TROPONIN I (HIGH SENSITIVITY)  TROPONIN I (HIGH SENSITIVITY)    Notable for elevated d-dimer  EKG   EKG Interpretation Date/Time:  Wednesday May 18 2023 13:45:22 EDT Ventricular Rate:  74 PR Interval:  178 QRS Duration:  82 QT Interval:  481 QTC Calculation: 534 R Axis:   57  Text Interpretation: Sinus rhythm Consider left atrial enlargement Anterior infarct, old Confirmed by Alvino Blood ([id number]) on 05/18/2023 4:59:43 PM         Imaging Studies  ordered: I ordered imaging studies including CXR On my interpretation imaging demonstrates no acute process I independently visualized and interpreted imaging. I agree with the radiologist interpretation   Medicines ordered and prescription drug management: Meds ordered this encounter  Medications   ipratropium-albuterol (DUONEB) 0.5-2.5 (3) MG/3ML nebulizer solution 3 mL   iohexol (OMNIPAQUE) 350 MG/ML injection 75 mL    -I have reviewed the patients home medicines and have made adjustments as needed    Reevaluation: After the interventions noted above, I reevaluated the patient and found that their symptoms have improved  Co morbidities that complicate the patient evaluation  Past Medical History:  Diagnosis Date   Abdominal pain 08/08/2020   Acute nonsuppurative otitis media of left ear 12/22/2014   Allergy    Anorexia 01/14/2021   600 calories/daily   Asthma exacerbation 02/22/2012   Broken jaw    1980s; fall  down flight of stairs   Callus of foot 12/28/2017   Coccydynia 04/08/2020   Concussion 06/2022   Fall at home    Conjunctivitis 04/04/2022   Fall 03/27/2017   At work on November 11, 2016, following with workman's comp  Last Assessment & Plan:   Larey Seat at work on 11/09/16 and has been following with [employer] comp. She   fell up some steps onto her knees and has been left with chronic pain.     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  Despite best efforts to proofread,  errors can occur which can change the documentation meaning.    Lonell Grandchild, MD 05/18/23 2059

## 2023-05-18 NOTE — ED Triage Notes (Signed)
 Pt has had a cough with productive green mucus for 3-4 months, pt became SOB yesterday. Denies pain. C/o weakness

## 2023-05-18 NOTE — Telephone Encounter (Signed)
 Pt in ED.

## 2023-05-18 NOTE — ED Notes (Signed)
 RN provided teaching before Elopement

## 2023-05-19 ENCOUNTER — Encounter: Payer: Self-pay | Admitting: Family Medicine

## 2023-05-19 NOTE — Telephone Encounter (Signed)
 Pt was seen in ED 4/9

## 2023-05-24 ENCOUNTER — Institutional Professional Consult (permissible substitution): Payer: Medicare PPO | Admitting: Psychology

## 2023-05-24 ENCOUNTER — Ambulatory Visit: Payer: Self-pay

## 2023-05-27 ENCOUNTER — Encounter: Payer: Self-pay | Admitting: Physician Assistant

## 2023-05-31 ENCOUNTER — Other Ambulatory Visit: Payer: Self-pay | Admitting: Physician Assistant

## 2023-05-31 ENCOUNTER — Encounter: Payer: Medicare PPO | Admitting: Psychology

## 2023-05-31 DIAGNOSIS — F418 Other specified anxiety disorders: Secondary | ICD-10-CM

## 2023-05-31 MED ORDER — BUSPIRONE HCL 5 MG PO TABS: 5.0000 mg | ORAL_TABLET | Freq: Three times a day (TID) | ORAL | 1 refills | Status: DC

## 2023-06-02 ENCOUNTER — Other Ambulatory Visit: Payer: Self-pay | Admitting: Family Medicine

## 2023-06-06 ENCOUNTER — Encounter (HOSPITAL_BASED_OUTPATIENT_CLINIC_OR_DEPARTMENT_OTHER): Payer: Self-pay | Admitting: Pulmonary Disease

## 2023-06-06 ENCOUNTER — Ambulatory Visit (HOSPITAL_BASED_OUTPATIENT_CLINIC_OR_DEPARTMENT_OTHER): Admitting: Pulmonary Disease

## 2023-06-06 ENCOUNTER — Other Ambulatory Visit (HOSPITAL_BASED_OUTPATIENT_CLINIC_OR_DEPARTMENT_OTHER): Payer: Self-pay

## 2023-06-06 VITALS — BP 119/61 | HR 65 | Ht 63.0 in | Wt 122.5 lb

## 2023-06-06 DIAGNOSIS — D869 Sarcoidosis, unspecified: Secondary | ICD-10-CM

## 2023-06-06 DIAGNOSIS — J4540 Moderate persistent asthma, uncomplicated: Secondary | ICD-10-CM | POA: Diagnosis not present

## 2023-06-06 MED ORDER — BUDESONIDE-FORMOTEROL FUMARATE 80-4.5 MCG/ACT IN AERO
2.0000 | INHALATION_SPRAY | Freq: Two times a day (BID) | RESPIRATORY_TRACT | 11 refills | Status: DC
  Filled 2023-06-06: qty 10.2, 30d supply, fill #0

## 2023-06-06 MED ORDER — OPTICHAMBER DIAMOND-LG MASK DEVI
0 refills | Status: AC
Start: 2023-06-06 — End: ?
  Filled 2023-06-06: qty 1, 1d supply, fill #0

## 2023-06-06 NOTE — Progress Notes (Unsigned)
 Subjective:   PATIENT ID: Casey Rowe GENDER: female DOB: 02-15-1950, MRN: [id number]   HPI  Chief Complaint  Patient presents with   Follow-up    Asthma    Reason for Visit: Follow-up  [name] is a 73 year old female never smoker with sarcoidosis, asthma, HTN, anxiety, morton's neuroma who for follow-up.  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 On PRN low-dose Advair for her asthma. She reports chronic cough that begin in October 2022. Has worsened in the last 2.5 months. She was diagnosed with COVID, Kraken variant, in Jan 2023. She had had persistent productive cough. Sputum production has improved Tried tessalon  perles. She was seen in [facility] ED for wheezing, cough, and shortness of breath for asthma exacerbation in Feb 2023.  Heat, laying on her back will worsen her cough. Robitussin improves her cough. Denies chest tightness. Works out 3-4 times a week on cardio machine. In the last year she reports 20 lb of unintentional weight loss. Thyroid  levels normal. Previously evaluated for diarrhea.  06/11/21 Since our last visit she was treated for asthma exacerbation with prednisone  and prescribed Advair. With the steroid, she reports productive coughing and wheezing resolved. Shortness of breath has improved. Has to use robitussin frequently. Weight has been stabilizing. She reports occasional episodes of diaphoresis a few weeks ago. State her diet could be better  08/19/21 Cough improves with daily Advair use. Does have some hoarseness. She rinses mouth out. Continues to have diarrhea. Weight is stable between 116-119 lbs. She eats two meals a day and her husband reminds her to eat. She has no appetite.  01/04/22 Recently fell on her knee and having difficulty ambulating. Her cough is fairly controlled on Advair 1-2 times daily. Has to use albuterol  2-3 times a day to stop coughing fits. The advair  does cause her to be hoarse but she feels it is effective. Denies exacerbations requiring steroids or antibiotics since last visit. In general she is very active, going to the gym 4-5 days a week so she is frustrated with her injury. She will see her PCP for evaluation for this. Denies shortness of breath or wheezing.  06/06/23 Last visit in 2023 with me. She recently seen with Dr. Alva on 03/17/23 for chronic cough suspected to be post-viral bronchitis worsened by underlying asthma on scheduled nebulizers. Was prescribed prolonged course of prednisone  for two weeks. Also seen in the ED on 05/18/23 for presumed bronchitis with negative CXR and negative CTA 05/18/23 for pulmonary embolism and treated Duoneb.  She currently has not been needing her nebulizer for the last 5 days. No longer having nocturnal symptoms. She is using Advair diskus but only using it 2-3 days a week and only once a day. Does report heat triggers her symptoms.   Social History: Never smoker Retired Retail buyer. Currently subbing  Past Medical History:  Diagnosis Date   Abdominal pain 08/08/2020   Acute nonsuppurative otitis media of left ear 12/22/2014   Allergy    Anorexia 01/14/2021   600 calories/daily   Asthma exacerbation 02/22/2012   Broken jaw    1980s; fall down flight of stairs   Callus of foot 12/28/2017   Coccydynia 04/08/2020   Concussion 06/2022   Fall at home    Conjunctivitis 04/04/2022   Fall 03/27/2017   At work on November 11, 2016, following with workman's comp  Last Assessment & Plan:   Marvell Slider  at work on 11/09/16 and has been following with Workman's comp. She   fell up some steps onto her knees and has been left with chronic pain.     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/14/2021   Left hip pain 01/21/2020   Loss of appetite 01/14/2021   Macrocytic anemia 02/15/2022   Metatarsalgia of both feet 12/28/2017   Mild cognitive impairment with memory loss 11/02/2022   Moderate persistent asthma without complication 06/11/2021   Morton's neuroma of both feet 03/27/2017   Palpitation 03/24/2017   Rosacea 03/17/2020   Sarcoidosis    Sinusitis 01/18/2011   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   Vitamin B 12 deficiency 06/23/2022   Weight loss 11/07/2013     Family History  Problem Relation Age of Onset   Hypertension Mother    Dementia Mother        multi-infarct   Diabetes Mother    Diabetes Father    Heart attack Father    Heart disease Father    Psoriasis Father    Cancer Sister        pancreatic   Alcohol abuse Sister        substance   Hepatitis B Brother    Gout Brother    Hypertension Brother    Glaucoma Brother    Heart disease Brother    Rashes / Skin problems Son    Other Son        bad allergies, aspirin, dermatitis   Rashes / Skin problems Son    Cancer Maternal Aunt        breast   Breast cancer Maternal Aunt      Social History   Occupational History   Occupation: Retired  Tobacco Use   Smoking status: Never   Smokeless tobacco: Never  Vaping Use   Vaping status: Never Used  Substance and Sexual Activity   Alcohol use: Yes    Alcohol/week: 14.0 - 21.0 standard drinks of alcohol    Types: 14 - 21 Glasses of wine per week    Comment: couple glasses of wine "a lot of nights"   Drug use: No   Sexual activity: Yes    Allergies  Allergen Reactions   Demerol Nausea And Vomiting   Singulair  [Montelukast ]     Dry mouth/dehydration   Augmentin  [Amoxicillin -Pot Clavulanate] Diarrhea    Caused Diarrhea     Outpatient Medications Prior to Visit  Medication Sig Dispense Refill   albuterol  (PROVENTIL ) (2.5 MG/3ML) 0.083% nebulizer solution Take 3 mLs (2.5 mg total) by nebulization every 6  (six) hours as needed for wheezing or shortness of breath. 468 mL 1   buPROPion  (WELLBUTRIN  XL) 300 MG 24 hr tablet Take 300 mg by mouth daily.     busPIRone  (BUSPAR ) 5 MG tablet Take 1 tablet (5 mg total) by mouth 3 (three) times daily. 90 tablet 1   fenofibrate  micronized (LOFIBRA) 134 MG capsule TAKE ONE CAPSULE BY MOUTH EVERY MORNING BEFORE BREAKFAST 90 capsule 1   fluticasone -salmeterol (ADVAIR DISKUS) 250-50 MCG/ACT AEPB Inhale 1 puff into the lungs in the morning and at bedtime. 60 each 5   gabapentin (NEURONTIN) 300 MG capsule Take 300 mg by mouth 3 (three) times daily.  losartan  (COZAAR ) 50 MG tablet TAKE 1 TABLET BY MOUTH DAILY 90 tablet 1   metoprolol  succinate (TOPROL -XL) 50 MG 24 hr tablet TAKE 1 TABLET BY MOUTH DAILY WITH A MEAL 90 tablet 1   Nebulizer MISC Use w/ nebulized medication q 6 hours 1 each 0   Respiratory Therapy Supplies (NEBULIZER MASK ADULT/TUBING) MISC 1 each by Does not apply route every 6 (six) hours. Use w/ nebulized med q 6 hours 1 each 0   albuterol  (VENTOLIN  HFA) 108 (90 Base) MCG/ACT inhaler Inhale 1-2 puffs into the lungs every 4 (four) hours as needed for wheezing or shortness of breath. (Patient not taking: Reported on 06/06/2023) 18 g 5   azelastine  (ASTELIN ) 0.1 % nasal spray Place 2 sprays into both nostrils 2 (two) times daily. Use in each nostril as directed (Patient not taking: Reported on 06/06/2023) 30 mL 12   benzonatate  (TESSALON ) 100 MG capsule Take 1 capsule (100 mg total) by mouth 2 (two) times daily as needed. (Patient not taking: Reported on 06/06/2023) 20 capsule 0   predniSONE  (DELTASONE ) 10 MG tablet Take 4 tabs  daily with food x 4 days, then 3 tabs daily x 4 days, then 2 tabs daily x 4 days, then 1 tab daily x4 days then stop. #40 (Patient not taking: Reported on 06/06/2023) 40 tablet 0   promethazine -dextromethorphan (PROMETHAZINE -DM) 6.25-15 MG/5ML syrup Take 5 mLs by mouth 4 (four) times daily as needed for cough. (Patient not taking:  Reported on 06/06/2023) 118 mL 0   No facility-administered medications prior to visit.    Review of Systems  Constitutional:  Negative for chills, diaphoresis, fever, malaise/fatigue and weight loss.  HENT:  Negative for congestion.   Respiratory:  Positive for cough. Negative for hemoptysis, sputum production, shortness of breath and wheezing.   Cardiovascular:  Negative for chest pain, palpitations and leg swelling.     Objective:   Vitals:   06/06/23 0932  BP: 119/61  Pulse: 65  SpO2: 98%  Weight: 122 lb 8 oz (55.6 kg)  Height: 5\' 3"  (1.6 m)   SpO2: 98 %  Physical Exam: General: Well-appearing, no acute distress HENT: [place], AT Eyes: EOMI, no scleral icterus Respiratory: Clear to auscultation bilaterally.  No crackles, wheezing or rales Cardiovascular: RRR, -M/R/G, no JVD Extremities:-Edema,-tenderness Neuro: AAO x4, CNII-XII grossly intact Psych: Normal mood, normal affect  Data Reviewed:  Imaging: CXR 03/26/21 - No active issues. No hilar adenopathy CTA 05/18/23 - No PE. Normal parenchyma  PFT: 08/19/21 FVC 3.31 (107%) FEV1 2.48 (106%) Ratio 79  TLC 117% RV 139% DLCO 91%.  Interpretation: On Advair. No obstructive or restrictive defect present. No significant BD response however does not preclude bronchodilator benefit. F-V loops suggestive of minimal small airway disease.   Labs: CBC    Component Value Date/Time   WBC 7.0 05/18/2023 1413   RBC 4.14 05/18/2023 1413   HGB 13.3 05/18/2023 1413   HCT 42.6 05/18/2023 1413   PLT 350 05/18/2023 1413   MCV 102.9 (H) 05/18/2023 1413   MCH 32.1 05/18/2023 1413   MCHC 31.2 05/18/2023 1413   RDW 12.3 05/18/2023 1413   LYMPHSABS 0.8 04/07/2023 1146   MONOABS 0.5 04/07/2023 1146   EOSABS 0.3 04/07/2023 1146   BASOSABS 0.0 04/07/2023 1146   Absolute eos 03/26/21 -700    Assessment & Plan:   Discussion: 73 year old female never smoker with sarcoidosis, asthma, HTN, anxiety, morton's neuroma who presents for  follow-up. Has hx of prolonged cough in setting  of asthma. Doubt active sarcoid which has been quiescent since 1990s. Asthma symptoms are controlled when on bronchodilators however she discontinues meds when acute phases end. Given her prolonged symptoms, counseled on bronchodilator compliance and prevention of exacerbations. Will reduce ICS load to improve compliance and change to HFA to reduce hoarseness in addition to spacer.  Moderate persistent asthma - symptomatic  DISCONTINUE Advair Diskus 250-50 mcg   START brand name Symbicort  TWO puffs TWICE a day. Rinse and gargle mouth after use CONTINUE Albuterol  AS NEEDED for shortness of breath or wheezing Unable to tolerate Singulair   Pulmonary sarcoidosis/monitoring --Dx in 1980s via lung biopsy --Recent chest imaging reviewed.  --Annual PFTs.  Next due 08/2022 --Annual ophthalmology exam. Patient to schedule  --EKG today: NSR  Health Maintenance  Immunization History  Administered Date(s) Administered   DTaP 08/09/2010   Fluad Quad(high Dose 65+) 01/17/2020, 11/14/2020, 01/13/2022   Fluad Trivalent(High Dose 65+) 11/02/2022   Influenza Split 02/22/2011   Influenza, High Dose Seasonal PF 04/22/2018, 11/04/2018   Influenza,inj,Quad PF,6+ Mos 01/30/2014, 03/11/2015, 03/16/2016   PFIZER Comirnaty (Gray Top)Covid-19 Tri-Sucrose Vaccine 05/20/2020   PFIZER(Purple Top)SARS-COV-2 Vaccination 03/15/2019, 04/05/2019, 12/05/2019, 10/21/2020   PNEUMOCOCCAL CONJUGATE-20 03/29/2022   Pfizer Covid-19 Vaccine Bivalent Booster 68yrs & up 08/19/2021   Pfizer(Comirnaty )Fall Seasonal Vaccine 12 years and older 11/27/2021, 11/02/2022   Pneumococcal Polysaccharide-23 11/04/2018   Tdap 11/04/2018, 08/14/2020, 08/14/2020   Zoster Recombinant(Shingrix ) 08/14/2020, 11/14/2020   Zoster, Live 03/11/2015   No orders of the defined types were placed in this encounter.  Meds ordered this encounter  Medications   Spacer/Aero-Holding Chambers (OPTICHAMBER  DIAMOND-LG MASK) DEVI    Sig: Dispense one    Dispense:  1 each    Refill:  0   budesonide -formoterol  (SYMBICORT ) 80-4.5 MCG/ACT inhaler    Sig: Inhale 2 puffs into the lungs 2 (two) times daily.    Dispense:  10.2 g    Refill:  11    Brand name only   Return in about 2 months (around 08/06/2023).  I have spent a total time of 33-minutes on the day of the appointment including chart review, data review, collecting history, coordinating care and discussing medical diagnosis and plan with the patient/family. Past medical history, allergies, medications were reviewed. Pertinent imaging, labs and tests included in this note have been reviewed and interpreted independently by me.  Davelyn Gwinn Genetta Kenning, MD [facility] 06/06/2023 10:01 AM

## 2023-06-06 NOTE — Telephone Encounter (Signed)
 Dispense hx reviewed, looks like she has been getting buspirone  15mg  before [place] refilled 5mg ?

## 2023-06-06 NOTE — Patient Instructions (Addendum)
 Moderate persistent asthma - symptomatic  DISCONTINUE Advair Diskus 250-50 mcg   START brand name Symbicort  TWO puffs TWICE a day. Rinse and gargle mouth after use CONTINUE Albuterol  AS NEEDED for shortness of breath or wheezing

## 2023-06-06 NOTE — Telephone Encounter (Signed)
 Spoke w/ Wilmer Hash- Terresa Ferry previously prescribed buspirone  15mg  tid, last picked up for that dose and sig in March 2025.

## 2023-06-07 ENCOUNTER — Other Ambulatory Visit: Payer: Self-pay | Admitting: Physician Assistant

## 2023-06-07 DIAGNOSIS — F418 Other specified anxiety disorders: Secondary | ICD-10-CM

## 2023-06-07 MED ORDER — BUSPIRONE HCL 15 MG PO TABS: 15.0000 mg | ORAL_TABLET | Freq: Three times a day (TID) | ORAL | 1 refills | Status: DC

## 2023-06-08 ENCOUNTER — Encounter (HOSPITAL_BASED_OUTPATIENT_CLINIC_OR_DEPARTMENT_OTHER): Payer: Self-pay | Admitting: Pulmonary Disease

## 2023-06-30 ENCOUNTER — Telehealth: Payer: Self-pay | Admitting: Family Medicine

## 2023-06-30 NOTE — Telephone Encounter (Signed)
 Pt made an appt on mychart for psychiatric issues. Based on the sxs I called triage in an attempt to warm transfer, they told me to only use that line for red word sxs, (not stating she was suicidal in the appt notes). Called the behavioral helpline and was sent to voicemail. After speaking with Mylinda Asa about next steps she advised to call the husband as he is the only one on an updated dpr. Spoke with him briefly about the situation, and advised him to take pt to [facility] ED. He refused stating she has other appts today she cannot miss. Advised him it is very important she is evaluated based on her sxs. He stated he did not understand what I mean and pt interjected stating she would message back about this on mychart. Mylinda Asa made aware stated she will follow up with pt.

## 2023-07-05 NOTE — Telephone Encounter (Signed)
 Called patient and she stated that 07/14/2023 appointment was fine. It was not an urgent matter.

## 2023-07-12 ENCOUNTER — Encounter: Payer: Self-pay | Admitting: Family Medicine

## 2023-07-14 ENCOUNTER — Ambulatory Visit: Admitting: Family Medicine

## 2023-07-16 ENCOUNTER — Other Ambulatory Visit: Payer: Self-pay | Admitting: Family Medicine

## 2023-07-16 MED ORDER — BUPROPION HCL ER (XL) 300 MG PO TB24: ORAL_TABLET | ORAL | Status: DC

## 2023-07-16 MED ORDER — BUPROPION HCL ER (XL) 150 MG PO TB24: ORAL_TABLET | ORAL | 5 refills | Status: DC

## 2023-07-26 ENCOUNTER — Ambulatory Visit: Admitting: Family Medicine

## 2023-08-03 ENCOUNTER — Other Ambulatory Visit: Payer: Self-pay

## 2023-08-03 ENCOUNTER — Encounter (HOSPITAL_BASED_OUTPATIENT_CLINIC_OR_DEPARTMENT_OTHER): Payer: Self-pay | Admitting: Nurse Practitioner

## 2023-08-03 ENCOUNTER — Emergency Department (HOSPITAL_BASED_OUTPATIENT_CLINIC_OR_DEPARTMENT_OTHER)

## 2023-08-03 ENCOUNTER — Emergency Department (HOSPITAL_BASED_OUTPATIENT_CLINIC_OR_DEPARTMENT_OTHER): Admitting: Radiology

## 2023-08-03 ENCOUNTER — Ambulatory Visit (HOSPITAL_BASED_OUTPATIENT_CLINIC_OR_DEPARTMENT_OTHER): Admitting: Nurse Practitioner

## 2023-08-03 ENCOUNTER — Emergency Department (HOSPITAL_BASED_OUTPATIENT_CLINIC_OR_DEPARTMENT_OTHER)
Admission: EM | Admit: 2023-08-03 | Discharge: 2023-08-03 | Disposition: A | Discharge status: Home / Self Care | Attending: Emergency Medicine | Admitting: Emergency Medicine

## 2023-08-03 VITALS — BP 125/66 | HR 67 | Ht 63.0 in | Wt 115.0 lb

## 2023-08-03 DIAGNOSIS — Z7951 Long term (current) use of inhaled steroids: Secondary | ICD-10-CM | POA: Insufficient documentation

## 2023-08-03 DIAGNOSIS — M1712 Unilateral primary osteoarthritis, left knee: Secondary | ICD-10-CM | POA: Diagnosis not present

## 2023-08-03 DIAGNOSIS — Y92481 Parking lot as the place of occurrence of the external cause: Secondary | ICD-10-CM | POA: Insufficient documentation

## 2023-08-03 DIAGNOSIS — S0990XA Unspecified injury of head, initial encounter: Secondary | ICD-10-CM | POA: Diagnosis not present

## 2023-08-03 DIAGNOSIS — I10 Essential (primary) hypertension: Secondary | ICD-10-CM | POA: Diagnosis not present

## 2023-08-03 DIAGNOSIS — S5002XA Contusion of left elbow, initial encounter: Secondary | ICD-10-CM | POA: Insufficient documentation

## 2023-08-03 DIAGNOSIS — S8002XA Contusion of left knee, initial encounter: Secondary | ICD-10-CM | POA: Insufficient documentation

## 2023-08-03 DIAGNOSIS — R053 Chronic cough: Secondary | ICD-10-CM | POA: Diagnosis not present

## 2023-08-03 DIAGNOSIS — W19XXXA Unspecified fall, initial encounter: Secondary | ICD-10-CM

## 2023-08-03 DIAGNOSIS — M19022 Primary osteoarthritis, left elbow: Secondary | ICD-10-CM | POA: Diagnosis not present

## 2023-08-03 DIAGNOSIS — M25462 Effusion, left knee: Secondary | ICD-10-CM | POA: Diagnosis not present

## 2023-08-03 DIAGNOSIS — R059 Cough, unspecified: Secondary | ICD-10-CM | POA: Diagnosis not present

## 2023-08-03 DIAGNOSIS — Z7952 Long term (current) use of systemic steroids: Secondary | ICD-10-CM | POA: Insufficient documentation

## 2023-08-03 DIAGNOSIS — I672 Cerebral atherosclerosis: Secondary | ICD-10-CM | POA: Diagnosis not present

## 2023-08-03 DIAGNOSIS — W01198A Fall on same level from slipping, tripping and stumbling with subsequent striking against other object, initial encounter: Secondary | ICD-10-CM | POA: Insufficient documentation

## 2023-08-03 DIAGNOSIS — Z79899 Other long term (current) drug therapy: Secondary | ICD-10-CM | POA: Diagnosis not present

## 2023-08-03 DIAGNOSIS — J45909 Unspecified asthma, uncomplicated: Secondary | ICD-10-CM | POA: Diagnosis not present

## 2023-08-03 DIAGNOSIS — Z043 Encounter for examination and observation following other accident: Secondary | ICD-10-CM | POA: Diagnosis not present

## 2023-08-03 DIAGNOSIS — S199XXA Unspecified injury of neck, initial encounter: Secondary | ICD-10-CM | POA: Diagnosis not present

## 2023-08-03 DIAGNOSIS — J4541 Moderate persistent asthma with (acute) exacerbation: Secondary | ICD-10-CM | POA: Diagnosis not present

## 2023-08-03 MED ORDER — METHYLPREDNISOLONE ACETATE 80 MG/ML IJ SUSP: 120.0000 mg | Freq: Once | INTRAMUSCULAR | Status: DC

## 2023-08-03 MED ORDER — AZITHROMYCIN 250 MG PO TABS: ORAL_TABLET | ORAL | 0 refills | Status: DC

## 2023-08-03 MED ORDER — METHYLPREDNISOLONE ACETATE 80 MG/ML IJ SUSP
120.0000 mg | Freq: Once | INTRAMUSCULAR | Status: AC
  Administered 2023-08-03: 120 mg via INTRAMUSCULAR

## 2023-08-03 MED ORDER — ALBUTEROL SULFATE (2.5 MG/3ML) 0.083% IN NEBU
2.5000 mg | INHALATION_SOLUTION | Freq: Once | RESPIRATORY_TRACT | Status: AC
  Administered 2023-08-03: 2.5 mg via RESPIRATORY_TRACT

## 2023-08-03 MED ORDER — PREDNISONE 10 MG PO TABS: ORAL_TABLET | ORAL | 0 refills | Status: DC

## 2023-08-03 MED ORDER — BUDESONIDE-FORMOTEROL FUMARATE 160-4.5 MCG/ACT IN AERO: 2.0000 | INHALATION_SPRAY | Freq: Two times a day (BID) | RESPIRATORY_TRACT | 6 refills | Status: AC

## 2023-08-03 NOTE — ED Notes (Signed)
 Pt d/c instructions, medications, and follow-up care reviewed with pt. Pt verbalized understanding and had no further questions at time of d/c. Pt CA&Ox4, ambulatory, and in NAD at time of d/c

## 2023-08-03 NOTE — ED Provider Notes (Signed)
 [place] [facility] AT [facility] Provider Note   CSN: [id number] Arrival date & time: 08/03/23  1119     Patient presents with: Felton Niels LITTIE Nancey is a 73 y.o. female.   73 year old female presents today for concern of a fall that occurred last night while she was in the parking lot.  She believes this is a mechanical fall.  No prodromal symptoms.  Potentially hit her head but she is not entirely certain.  She was seen by her pulmonology office today and was recommended to come here for further evaluation of potential head injury.  She was also started on prednisone  and antibiotic for cough and coverage for potential pneumonia.  She is requesting a chest x-ray as well.  She has bruising to her left knee and left elbow.  No other joint pain.  Did not lose consciousness.  Not on blood thinner.  The history is provided by the patient and the spouse. No language interpreter was used.       Prior to Admission medications   Medication Sig Start Date End Date Taking? Authorizing Provider  albuterol  (PROVENTIL ) (2.5 MG/3ML) 0.083% nebulizer solution Take 3 mLs (2.5 mg total) by nebulization every 6 (six) hours as needed for wheezing or shortness of breath. 05/17/23 08/03/23  Cyndi Shaver, PA-C  albuterol  (VENTOLIN  HFA) 108 (90 Base) MCG/ACT inhaler Inhale 1-2 puffs into the lungs every 4 (four) hours as needed for wheezing or shortness of breath. 03/08/23   Domenica Harlene LABOR, MD  azelastine  (ASTELIN ) 0.1 % nasal spray Place 2 sprays into both nostrils 2 (two) times daily. Use in each nostril as directed Patient not taking: Reported on 08/03/2023 03/16/16   Domenica Harlene LABOR, MD  azithromycin  (ZITHROMAX ) 250 MG tablet Take 2 tablets on day one then take 1 tablet daily for four additional days 08/03/23   Malachy Comer GAILS, NP  benzonatate  (TESSALON ) 100 MG capsule Take 1 capsule (100 mg total) by mouth 2 (two) times daily as needed. Patient not taking: Reported on 08/03/2023  03/11/23   Cyndi Shaver, PA-C  budesonide -formoterol  (SYMBICORT ) 160-4.5 MCG/ACT inhaler Inhale 2 puffs into the lungs in the morning and at bedtime. 08/03/23   Cobb, Comer GAILS, NP  buPROPion  (WELLBUTRIN  XL) 150 MG 24 hr tablet Take a 300 mg tab and a 150 mg tab daily for a total daily dose of 450 mg 07/16/23   Domenica Harlene LABOR, MD  buPROPion  (WELLBUTRIN  XL) 300 MG 24 hr tablet Take a 300 mg tab and a 150 mg tab daily for a total daily dose of 450 mg 07/16/23   Domenica Harlene LABOR, MD  busPIRone  (BUSPAR ) 15 MG tablet Take 1 tablet (15 mg total) by mouth 3 (three) times daily. 06/07/23   Cyndi Shaver, PA-C  fenofibrate  micronized (LOFIBRA) 134 MG capsule TAKE ONE CAPSULE BY MOUTH EVERY MORNING BEFORE BREAKFAST 06/02/23   Domenica Harlene LABOR, MD  gabapentin (NEURONTIN) 300 MG capsule Take 300 mg by mouth 3 (three) times daily. 10/18/22   [provider]  losartan  (COZAAR ) 50 MG tablet TAKE 1 TABLET BY MOUTH DAILY 03/10/23   Domenica Harlene LABOR, MD  metoprolol  succinate (TOPROL -XL) 50 MG 24 hr tablet TAKE 1 TABLET BY MOUTH DAILY WITH A MEAL 03/09/23   Domenica Harlene LABOR, MD  Nebulizer MISC Use w/ nebulized medication q 6 hours 03/11/23   Drubel, Shaver, PA-C  predniSONE  (DELTASONE ) 10 MG tablet 4 tabs for 3 days, then 3 tabs for 3 days, 2 tabs  for 3 days, then 1 tab for 3 days, then stop 08/03/23   Cobb, Comer GAILS, NP  promethazine -dextromethorphan (PROMETHAZINE -DM) 6.25-15 MG/5ML syrup Take 5 mLs by mouth 4 (four) times daily as needed for cough. Patient not taking: Reported on 08/03/2023 03/24/23   Cyndi Shaver, PA-C  Respiratory Therapy Supplies (NEBULIZER MASK ADULT/TUBING) MISC 1 each by Does not apply route every 6 (six) hours. Use w/ nebulized med q 6 hours 03/11/23   Cyndi Shaver, PA-C  Spacer/Aero-Holding Chambers [facility] DIAMOND -LG MASK) DEVI Dispense one 06/06/23   Kassie Acquanetta Bradley, MD    Allergies: Demerol, Singulair  [montelukast ], and Augmentin  [amoxicillin -pot clavulanate]    Review of  Systems  Constitutional:  Negative for chills and fever.  Eyes:  Negative for visual disturbance.  Musculoskeletal:  Positive for arthralgias.  Neurological:  Negative for syncope and headaches.  All other systems reviewed and are negative.   Updated Vital Signs BP 137/78   Pulse 64   Temp 98.5 F (36.9 C) (Oral)   Resp 18   LMP 02/08/1997   SpO2 99%   Physical Exam Vitals and nursing note reviewed.  Constitutional:      General: She is not in acute distress.    Appearance: Normal appearance. She is not ill-appearing.  HENT:     Head: Normocephalic and atraumatic.     Nose: Nose normal.   Eyes:     Conjunctiva/sclera: Conjunctivae normal.    Cardiovascular:     Rate and Rhythm: Normal rate and regular rhythm.  Pulmonary:     Effort: Pulmonary effort is normal. No respiratory distress.   Musculoskeletal:        General: No deformity. Normal range of motion.     Cervical back: Normal range of motion.     Comments: Cervical, thoracic, lumbar spine without tenderness to palpation.  Full range of motion in bilateral upper and lower extremities with good strength.  There is some tenderness to palpation of the left elbow, left knee otherwise all major joints without tenderness to palpation.  Visible bruising noted to left elbow and left knee.   Skin:    Findings: No rash.   Neurological:     Mental Status: She is alert.     (all labs ordered are listed, but only abnormal results are displayed) Labs Reviewed - No data to display  EKG: None  Radiology: No results found.   Procedures   Medications Ordered in the ED - No data to display                                  Medical Decision Making Amount and/or Complexity of Data Reviewed Radiology: ordered.   Medical Decision Making / ED Course   This patient presents to the ED for concern of fall, this involves an extensive number of treatment options, and is a complaint that carries with it a high risk of  complications and morbidity.  The differential diagnosis includes head injury, fracture, contusion  MDM: 73 year old female presents today after fall that occurred last night.  Denies any loss of consciousness.  Not on blood thinners. Also has bruising to her left elbow, left knee.  Rest of the exam is overall reassuring.  Husband is at bedside. Imaging obtained including chest x-ray at her request.  She is being treated empirically with Zithromax  and prednisone .  CT head and C-spine without acute concerns.  Chest x-ray, left elbow and left knee  x-ray without acute concerns. Discussed symptoms of a potential concussion.  Discussed follow-up with primary care doctor. Supportive care discussed. Patient and husband voiced understanding and are in agreement with plan.  Discharged in stable condition.  Lab Tests: -I ordered, reviewed, and interpreted labs.   The pertinent results include:   Labs Reviewed - No data to display    EKG  EKG Interpretation Date/Time:    Ventricular Rate:    PR Interval:    QRS Duration:    QT Interval:    QTC Calculation:   R Axis:      Text Interpretation:           Imaging Studies ordered: I ordered imaging studies including CT head, CT C-spine, chest x-ray, left elbow x-ray, left knee x-ray I independently visualized and interpreted imaging. I agree with the radiologist interpretation   Medicines ordered and prescription drug management: No orders of the defined types were placed in this encounter.   -I have reviewed the patients home medicines and have made adjustments as needed   Reevaluation: After the interventions noted above, I reevaluated the patient and found that they have :improved  Co morbidities that complicate the patient evaluation  Past Medical History:  Diagnosis Date   Abdominal pain 08/08/2020   Acute nonsuppurative otitis media of left ear 12/22/2014   Allergy    Anorexia 01/14/2021   600 calories/daily   Asthma  exacerbation 02/22/2012   Broken jaw    1980s; fall down flight of stairs   Callus of foot 12/28/2017   Coccydynia 04/08/2020   Concussion 06/2022   Fall at home    Conjunctivitis 04/04/2022   Fall 03/27/2017   At work on November 11, 2016, following with workman's comp  Last Assessment & Plan:   Clemens at work on 11/09/16 and has been following with [employer] comp. She   fell up some steps onto her knees and has been left with chronic pain.     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   Palpitation 03/24/2017   Rosacea 03/17/2020   Sarcoidosis    Sinusitis 01/18/2011   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  Return precaution discussed.  Patient voices understanding and is in agreement with plan.    Final diagnoses:  Injury of head, initial encounter  Fall, initial encounter    ED Discharge Orders     None          Hildegard Loge, PA-C 08/03/23 1406    Tegeler, Lonni PARAS, MD 08/03/23 [phone]

## 2023-08-03 NOTE — Patient Instructions (Signed)
 Continue Albuterol  inhaler 2 puffs or 3 mL neb every 6 hours as needed for shortness of breath or wheezing. Notify if symptoms persist despite rescue inhaler/neb use. Use nebs 2-3 times a day until symptoms improve/resolve Step up Symbicort  to 160 mcg dose - 2 puffs Twice daily. Brush tongue and rinse mouth afterwards. Use with spacer Continue azelastine  nasal spray  Prednisone  taper. 4 tabs for 3 days, then 3 tabs for 3 days, 2 tabs for 3 days, then 1 tab for 3 days, then stop. Take in AM with food Benzonatate  1 capsule Three times a day as needed for cough Azithromycin  - take 2 tabs on day 1 then 1 tab daily for four additional days. Take with food  I have recommended you go to the emergency department due to the fall you had. You told me you hit your head and you're having some brain fog and feeling like you can't think clearly. You also look like you may have an effusion to your left knee that they will need to examine  Follow up in 2-3 weeks for asthma follow up with Dr. Jude or Casey Hadli Vandemark,NP. If symptoms do not improve or worsen, please contact office for sooner follow up or seek emergency care.

## 2023-08-03 NOTE — Progress Notes (Signed)
 @Patient  ID: Casey Rowe, female    DOB: 05/24/1950, 73 y.o.   MRN: [id number]  Chief Complaint  Patient presents with   Follow-up    Sarcoidosis    Referring provider: Domenica Harlene LABOR, MD  HPI: 73 year female, never smoker followed for   TEST/EVENTS:  08/19/2021 PFT: FVC 102, FEV1 105, ratio 75, TLC 117, DLCO 91 05/18/2023 CTA chest: no PE. No acute pulm findings.  06/06/2023: OV with Dr. Kassie. Saw Dr. 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 Prescribed prolonged course of steroids. CXR in April negative. Currently not been needing nebulizer. Using Advair diskus; only using 2-3 days a week. Having hoarseness. Reduced ICS load to improve compliance and change to HFA. 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   08/03/2023: Today - follow up Discussed the use of AI scribe software for clinical note transcription with the patient, who gave verbal consent to proceed.  History of Present Illness   Casey Rowe is a 73 year old female who presents for follow up.   She is experiencing respiratory issues, which she attributes to the weather and allergies. She has been using her Symbicort  inhaler and nebulizers frequently, stating 'I'm on nebulizers like, uh, a lot now.' Her breathing difficulties worsen during hay fever season and when the heat is on. She has not had a breathing treatment in a couple of days, although she was previously doing them twice a day. She is coughing up green phlegm. Has some wheezing. No hemoptysis, fevers, chills.   She mentions a fall last night in the parking lot. Now having difficulty thinking clearly and feels more confused. When she fell, she hit her head, shoulder, and knees on the concrete. She has a lot of pain in her left knee as well. Left elbow is sore. She denies any headaches, vision changes, gait instability. No lethargy.       Allergies  Allergen Reactions   Demerol Nausea And  Vomiting   Singulair  [Montelukast ]     Dry mouth/dehydration   Augmentin  [Amoxicillin -Pot Clavulanate] Diarrhea    Caused Diarrhea    Immunization History  Administered Date(s) Administered   DTaP 08/09/2010   Fluad  Quad(high Dose 65+) 01/17/2020, 11/14/2020, 01/13/2022   Fluad  Trivalent(High Dose 65+) 11/02/2022   Influenza Split 02/22/2011   Influenza, High Dose Seasonal PF 04/22/2018, 11/04/2018   Influenza,inj,Quad PF,6+ Mos 01/30/2014, 03/11/2015, 03/16/2016   PFIZER Comirnaty (Gray Top)Covid-19 Tri-Sucrose Vaccine 05/20/2020   PFIZER(Purple Top)SARS-COV-2 Vaccination 03/15/2019, 04/05/2019, 12/05/2019, 10/21/2020   PNEUMOCOCCAL CONJUGATE-20 03/29/2022   Pfizer Covid-19 Vaccine Bivalent Booster 49yrs & up 08/19/2021   Pfizer(Comirnaty )Fall Seasonal Vaccine 12 years and older 11/27/2021, 11/02/2022   Pneumococcal Polysaccharide-23 11/04/2018   Tdap 11/04/2018, 08/14/2020, 08/14/2020   Zoster Recombinant(Shingrix ) 08/14/2020, 11/14/2020   Zoster, Live 03/11/2015    Past Medical History:  Diagnosis Date   Abdominal pain 08/08/2020   Acute nonsuppurative otitis media of left ear 12/22/2014   Allergy    Anorexia 01/14/2021   600 calories/daily   Asthma exacerbation 02/22/2012   Broken jaw    1980s; fall down flight of stairs   Callus of foot 12/28/2017   Coccydynia 04/08/2020   Concussion 06/2022   Fall at home    Conjunctivitis 04/04/2022   Fall 03/27/2017   At work on November 11, 2016, following with workman's comp  Last Assessment & Plan:   Clemens at work on 11/09/16 and has been following with [employer]  comp. She   fell up some steps onto her knees and has been left with chronic pain.     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 468 mL 1   albuterol  (VENTOLIN  HFA) 108 (90 Base) MCG/ACT inhaler Inhale 1-2 puffs into the lungs every 4 (four) hours as needed for wheezing or shortness of breath. 18 g 5   buPROPion  (WELLBUTRIN  XL) 150 MG 24 hr tablet Take a 300 mg tab and a 150 mg tab daily for a total daily dose of 450 mg 30 tablet 5   buPROPion  (WELLBUTRIN  XL) 300 MG 24 hr tablet Take a 300 mg tab and a 150 mg tab daily for a total daily dose of 450 mg     busPIRone  (BUSPAR ) 15 MG tablet Take 1 tablet (15 mg total) by mouth 3 (three) times daily. 270 tablet 1   fenofibrate  micronized (LOFIBRA) 134 MG capsule TAKE ONE CAPSULE BY MOUTH EVERY MORNING BEFORE BREAKFAST 90 capsule 1   gabapentin (NEURONTIN) 300 MG capsule Take 300 mg by mouth 3 (three) times daily.     losartan  (COZAAR ) 50 MG tablet TAKE 1 TABLET BY MOUTH DAILY 90 tablet 1   metoprolol  succinate (TOPROL -XL) 50 MG 24 hr tablet TAKE 1 TABLET BY MOUTH DAILY WITH A MEAL 90 tablet 1   Nebulizer MISC Use w/ nebulized medication q 6 hours 1 each 0   Respiratory Therapy Supplies (NEBULIZER MASK  ADULT/TUBING) MISC 1 each by Does not apply route every 6 (six) hours. Use w/ nebulized med q 6 hours 1 each 0   Spacer/Aero-Holding Chambers (OPTICHAMBER DIAMOND -LG MASK) DEVI Dispense one 1 each 0   budesonide -formoterol  (SYMBICORT ) 80-4.5 MCG/ACT inhaler Inhale 2 puffs into the lungs 2 (two) times daily. 10.2 g 11   azelastine  (ASTELIN ) 0.1 % nasal spray Place 2 sprays into both nostrils 2 (two) times daily. Use in each nostril as directed (Patient not taking: Reported on 08/03/2023) 30 mL 12   benzonatate  (TESSALON ) 100 MG capsule Take 1 capsule (100 mg total) by mouth 2 (two) times daily as needed. (Patient not taking: Reported on 08/03/2023) 20 capsule 0   promethazine -dextromethorphan (PROMETHAZINE -DM) 6.25-15 MG/5ML syrup Take 5 mLs by mouth 4 (four) times daily as needed for cough. (Patient not taking: Reported on 08/03/2023) 118 mL 0   predniSONE  (DELTASONE ) 10 MG tablet Take 4 tabs  daily with  food x 4 days, then 3 tabs daily x 4 days, then 2 tabs daily x 4 days, then 1 tab daily x4 days then stop. #40 (Patient not taking: Reported on 08/03/2023) 40 tablet 0   No facility-administered medications prior to visit.     Review of Systems:   Constitutional: No weight loss or gain, night sweats, fevers, chills, or lassitude. +fatigue  HEENT: No headaches, difficulty swallowing, tooth/dental problems, or sore throat. No sneezing, itching, ear ache, nasal congestion, or post nasal drip CV:  No chest pain, orthopnea, PND, swelling in lower extremities, anasarca, dizziness, palpitations, syncope Resp: +shortness of breath with exertion; productive cough; wheezing. No hemoptysis. No chest wall deformity GI:  No heartburn, indigestion GU: No dysuria, change in color of urine, urgency or frequency.  Skin: No rash, lesions, ulcerations MSK: +left knee pain, left elbow pain  Neuro: +memory impairment  Psych: No depression or anxiety. Mood stable.     Physical Exam:  BP 125/66   Pulse 67   Ht  5' 3 (1.6 m)   Wt 115 lb (52.2 kg)   LMP 02/08/1997   SpO2 100%   BMI 20.37 kg/m   GEN: Pleasant, interactive, well-kempt; in no acute distress HEENT:  Normocephalic and atraumatic. PERRLA. Sclera white. Nasal turbinates pink, moist and patent bilaterally. No rhinorrhea present. Oropharynx pink and moist, without exudate or edema. No lesions, ulcerations, or postnasal drip.  NECK:  Supple w/ fair ROM. No lymphadenopathy.   CV: RRR, no m/r/g, no peripheral edema. Pulses intact, +2 bilaterally. No cyanosis, pallor or clubbing. PULMONARY:  Unlabored, regular breathing. Scattered wheezes; improved post neb. Bronchitic cough. No accessory muscle use.  GI: BS present and normoactive. Soft, non-tender to palpation. No organomegaly or masses detected.  MSK: Left knee ecchymosis and edema. Left elbow ecchymosis, skin tear.  Cap refil <2 sec all extrem. Neuro: A/Ox3. Short term memory recall impairment  Skin: Warm, no lesions or rashe Psych: Normal affect and behavior.     Lab Results:  CBC    Component Value Date/Time   WBC 7.0 05/18/2023 1413   RBC 4.14 05/18/2023 1413   HGB 13.3 05/18/2023 1413   HCT 42.6 05/18/2023 1413   PLT 350 05/18/2023 1413   MCV 102.9 (H) 05/18/2023 1413   MCH 32.1 05/18/2023 1413   MCHC 31.2 05/18/2023 1413   RDW 12.3 05/18/2023 1413   LYMPHSABS 0.8 04/07/2023 1146   MONOABS 0.5 04/07/2023 1146   EOSABS 0.3 04/07/2023 1146   BASOSABS 0.0 04/07/2023 1146    BMET    Component Value Date/Time   NA 132 (L) 05/18/2023 1413   K 4.1 05/18/2023 1413   CL 102 05/18/2023 1413   CO2 23 05/18/2023 1413   GLUCOSE 125 (H) 05/18/2023 1413   BUN 29 (H) 05/18/2023 1413   CREATININE 1.06 (H) 05/18/2023 1413   CREATININE 0.72 02/11/2014 0900   CALCIUM 9.6 05/18/2023 1413   GFRNONAA 57 (L) 05/18/2023 1413   GFRNONAA >89 02/11/2014 0900   GFRAA >89 02/11/2014 0900    BNP    Component Value Date/Time   BNP 423.3 (H) 03/26/2021 2202     Imaging:  No  results found.  albuterol  (PROVENTIL ) (2.5 MG/3ML) 0.083% nebulizer solution 2.5 mg     Date Action Dose Route User   Admitted on 08/03/2023   08/03/2023 1101 Given 2.5 mg Nebulization Busick, Ashlyn T, CMA      methylPREDNISolone  acetate (DEPO-MEDROL ) injection 120 mg     Date Action Dose Route  User   Admitted on 08/03/2023   08/03/2023 1108 Given 120 mg Intramuscular (Right Ventrogluteal) Busick, Ashlyn T, CMA          Latest Ref Rng & Units 08/19/2021   12:47 PM  PFT Results  FVC-Pre L 3.13   FVC-Predicted Pre % 102   FVC-Post L 3.31   FVC-Predicted Post % 107   Pre FEV1/FVC % % 79   Post FEV1/FCV % % 75   FEV1-Pre L 2.47   FEV1-Predicted Pre % 105   FEV1-Post L 2.48   DLCO uncorrected ml/min/mmHg 17.96   DLCO UNC% % 91   DLCO corrected ml/min/mmHg 17.85   DLCO COR %Predicted % 91   DLVA Predicted % 91   TLC L 5.94   TLC % Predicted % 117   RV % Predicted % 139     No results found for: NITRICOXIDE      Assessment & Plan:   Fall Fall in parking lot with possible head injury. Concern for post concussive syndrome. Unable to rule out intracranial bleed. A&Ox3. Report of brain fog and memory loss. Given change in mental status, recommend ED evaluation for further evaluation. Pt was brought to [facility] ED by CMA. VS were stable. Report given to triage RN - Alan.   Patient Instructions  Continue Albuterol  inhaler 2 puffs or 3 mL neb every 6 hours as needed for shortness of breath or wheezing. Notify if symptoms persist despite rescue inhaler/neb use. Use nebs 2-3 times a day until symptoms improve/resolve Step up Symbicort  to 160 mcg dose - 2 puffs Twice daily. Brush tongue and rinse mouth afterwards. Use with spacer Continue azelastine  nasal spray  Prednisone  taper. 4 tabs for 3 days, then 3 tabs for 3 days, 2 tabs for 3 days, then 1 tab for 3 days, then stop. Take in AM with food Benzonatate  1 capsule Three times a day as needed for cough Azithromycin  -  take 2 tabs on day 1 then 1 tab daily for four additional days. Take with food  I have recommended you go to the emergency department due to the fall you had. You told me you hit your head and you're having some brain fog and feeling like you can't think clearly. You also look like you may have an effusion to your left knee that they will need to examine  Follow up in 2-3 weeks for asthma follow up with Dr. Jude or Izetta Germain Koopmann,NP. If symptoms do not improve or worsen, please contact office for sooner follow up or seek emergency care.    Effusion of left knee Related to trauma from fall. Pt will be evaluated at the ED.   Moderate persistent asthmatic bronchitis with exacerbation Acute exacerbation with bronchitic cough with mild bronchospasm on exam; improved post neb. Will treat her with prednisone  taper and empiric azithromycin . Depo 80 mg inj x 1 and albuterol  neb 3 mL in office. Plan to step up Symbicort  to 160 mcg dose and reassess response. Close follow up. Action plan in place.    Advised if symptoms do not improve or worsen, to please contact office for sooner follow up or seek emergency care.   I spent 45 minutes of dedicated to the care of this patient on the date of this encounter to include pre-visit review of records, face-to-face time with the patient discussing conditions above, post visit ordering of testing, clinical documentation with the electronic health record, making appropriate referrals as documented, and communicating necessary findings to members of  the patients care team.  Casey LULLA Rouleau, NP 08/03/2023  Pt aware and understands NP's role.

## 2023-08-03 NOTE — Assessment & Plan Note (Signed)
 Fall in parking lot with possible head injury. Concern for post concussive syndrome. Unable to rule out intracranial bleed. A&Ox3. Report of brain fog and memory loss. Given change in mental status, recommend ED evaluation for further evaluation. Pt was brought to [facility] ED by CMA. VS were stable. Report given to triage RN - Alan.   Patient Instructions  Continue Albuterol  inhaler 2 puffs or 3 mL neb every 6 hours as needed for shortness of breath or wheezing. Notify if symptoms persist despite rescue inhaler/neb use. Use nebs 2-3 times a day until symptoms improve/resolve Step up Symbicort  to 160 mcg dose - 2 puffs Twice daily. Brush tongue and rinse mouth afterwards. Use with spacer Continue azelastine  nasal spray  Prednisone  taper. 4 tabs for 3 days, then 3 tabs for 3 days, 2 tabs for 3 days, then 1 tab for 3 days, then stop. Take in AM with food Benzonatate  1 capsule Three times a day as needed for cough Azithromycin  - take 2 tabs on day 1 then 1 tab daily for four additional days. Take with food  I have recommended you go to the emergency department due to the fall you had. You told me you hit your head and you're having some brain fog and feeling like you can't think clearly. You also look like you may have an effusion to your left knee that they will need to examine  Follow up in 2-3 weeks for asthma follow up with Dr. Jude or Casey Etheleen Valtierra,NP. If symptoms do not improve or worsen, please contact office for sooner follow up or seek emergency care.

## 2023-08-03 NOTE — Assessment & Plan Note (Signed)
 Acute exacerbation with bronchitic cough with mild bronchospasm on exam; improved post neb. Will treat her with prednisone  taper and empiric azithromycin . Depo 80 mg inj x 1 and albuterol  neb 3 mL in office. Plan to step up Symbicort  to 160 mcg dose and reassess response. Close follow up. Action plan in place.

## 2023-08-03 NOTE — Assessment & Plan Note (Addendum)
 Related to trauma from fall. Pt will be evaluated at the ED.

## 2023-08-03 NOTE — ED Triage Notes (Signed)
 Patient states she fell directly on her left knee last night. Was seen by her pulmonologist this morning and was told to come here to be evaluated for her fall. Denies hitting her head. Denies blood thinners.

## 2023-08-03 NOTE — Discharge Instructions (Addendum)
 Your scan of the head and neck was normal along with chest x-ray,, left elbow and knee x-ray.  Follow-up with your primary care doctor.  Return for any concerning symptoms.

## 2023-08-09 ENCOUNTER — Encounter: Payer: Self-pay | Admitting: Family Medicine

## 2023-08-09 NOTE — Progress Notes (Unsigned)
 Established Patient Office Visit  Subjective   Patient ID: Casey Rowe, female    DOB: December 08, 1950  Age: 73 y.o. MRN: [id number]  No chief complaint on file.   HPI  {History (Optional):23778}  Casey Rowe is a 73 yo female who presents for follow-up.  PMHx- of sarcoidosis,  Hypertension Losartan  50 mg daily, metoprolol  50 mg daily  HLD Fenofibrate  134 mg daily  Asthma- followed by Pulmonology Symbicort  inh twice daily, albuterol  as needed  Anxiety/depression Bupropion  450 mg daily, BuSpar  15 mg 3 times daily  Patient denies fever, chills, SOB, CP, palpitations, dyspnea, edema, HA, vision changes, N/V/D, abdominal pain, urinary symptoms, rash, weight changes, and recent illness or hospitalizations.   ROS See HPI   Objective:     {Vitals History (Optional):23777}  Physical Exam Constitutional:      General: She is not in acute distress.    Appearance: She is not ill-appearing, toxic-appearing or diaphoretic.  HENT:     Head: Normocephalic and atraumatic.     Right Ear: Tympanic membrane, ear canal and external ear normal.     Left Ear: Tympanic membrane, ear canal and external ear normal.     Nose: Nose normal. No congestion.     Mouth/Throat:     Mouth: Mucous membranes are moist.     Pharynx: Oropharynx is clear.   Eyes:     Extraocular Movements: Extraocular movements intact.     Right eye: Normal extraocular motion.     Left eye: Normal extraocular motion.     Conjunctiva/sclera: Conjunctivae normal.     Pupils: Pupils are equal, round, and reactive to light.   Neck:     Thyroid : No thyroid  mass or thyromegaly.     Vascular: No carotid bruit or JVD.   Cardiovascular:     Rate and Rhythm: Normal rate and regular rhythm.     Pulses: Normal pulses.          Radial pulses are 2+ on the right side and 2+ on the left side.       Dorsalis pedis pulses are 2+ on the right side and 2+ on the left side.     Heart sounds: Normal heart sounds, S1  normal and S2 normal. No murmur heard.    No friction rub. No gallop.  Pulmonary:     Effort: Pulmonary effort is normal. No respiratory distress.     Breath sounds: Normal breath sounds.  Abdominal:     General: Bowel sounds are normal. There is no distension.     Palpations: Abdomen is soft.     Tenderness: There is no abdominal tenderness. There is no guarding.   Musculoskeletal:        General: Normal range of motion.     Cervical back: Full passive range of motion without pain and normal range of motion. No edema or erythema.     Right lower leg: No edema.     Left lower leg: No edema.  Lymphadenopathy:     Cervical: No cervical adenopathy.   Skin:    General: Skin is warm and dry.     Capillary Refill: Capillary refill takes less than 2 seconds.   Neurological:     General: No focal deficit present.     Mental Status: She is alert and oriented to person, place, and time.     Cranial Nerves: No cranial nerve deficit.     Motor: No weakness.     Coordination: Coordination normal.  Gait: Gait normal.     Deep Tendon Reflexes: Reflexes normal.   Psychiatric:        Mood and Affect: Mood normal.        Behavior: Behavior normal.        Thought Content: Thought content normal.        No results found for any visits on 08/11/23.  {Labs (Optional):23779}  The 10-year ASCVD risk score (Arnett DK, et al., 2019) is: 15.7%    Assessment & Plan:   Problem List Items Addressed This Visit     Generalized anxiety disorder   She is tearful today due to chronic illness and life stressors including marriage trouble. She follows with psychiatry and will continue current meds for now but will reach out for care if needed.         Hyperlipidemia - Primary   Stable on current medications. Encourage heart healthy diet such as MIND or DASH diet, increase exercise, avoid trans fats, simple carbohydrates and processed foods, consider a krill or fish or flaxseed oil cap daily.         Hypertension   Well controlled, no changes to meds. Encouraged heart healthy diet such as the DASH diet and exercise as tolerated.        Mild cognitive impairment with memory loss   Continue to stay active, learn something new, maintain a heart healthy diet and hydrate we       Vitamin B 12 deficiency   Supplement monitor.      Weight loss   Continue to monitor and increase po intake especially high calorie dense foods        Other Visit Diagnoses       Moderate persistent asthma without complication          HCM: -Mgm: Last done 04/2022. Due 04/2024. -Pap: Last 2022. Next Due 2032. -DEXA: Last 2022, WNL. Repeat 3-5 years. - Colonoscopy: - Immunizations: Covid 19- Due   Unintentional Weight Loss  Discussed importance of maintaining nutrition despite lack of hunger. - Monitor weight and appetite - Encourage regular meals despite lack of hunger, protien shakes  Patient was educated on the diagnosis, treatment options, potential risks, benefits, and alternatives. All questions were addressed. Patient verbalized understanding and agrees with the plan of care. Will follow up as advised or sooner if symptoms worsen or new concerns arise.   Portions of this note were dictated using DRAGON voice recognition software. Please disregard any errors in transcription.    No follow-ups on file.    Doha Boling L Khyren Hing, NP

## 2023-08-09 NOTE — Telephone Encounter (Signed)
 Called pt and follow up appt scheduled with Harlene, NP for [facility].

## 2023-08-09 NOTE — Assessment & Plan Note (Signed)
 Supplement monitor.

## 2023-08-09 NOTE — Assessment & Plan Note (Signed)
 Well controlled, no changes to meds. Encouraged heart healthy diet such as the DASH diet and exercise as tolerated.

## 2023-08-09 NOTE — Telephone Encounter (Signed)
 When I called and spoke with pt husband he stated they not available to be seen for ER follow today,  Husband stated on will the appt on Thursday. Pt was seen in ER just need to F/U appt scheduled.

## 2023-08-09 NOTE — Assessment & Plan Note (Signed)
 She is tearful today due to chronic illness and life stressors including marriage trouble. She follows with psychiatry and will continue current meds for now but will reach out for care if needed.

## 2023-08-09 NOTE — Assessment & Plan Note (Signed)
 Referral to Neurology. Continue to stay active, learn something new, maintain a heart healthy diet and hydrate we

## 2023-08-09 NOTE — Assessment & Plan Note (Signed)
 Stable on current medications. Encourage heart healthy diet such as MIND or DASH diet, increase exercise, avoid trans fats, simple carbohydrates and processed foods, consider a krill or fish or flaxseed oil cap daily.

## 2023-08-09 NOTE — Assessment & Plan Note (Signed)
 Stable. Continue to monitor and increase po intake especially high calorie dense foods   Wt Readings from Last 3 Encounters:  08/11/23 122 lb 12.8 oz (55.7 kg)  08/03/23 115 lb (52.2 kg)  06/06/23 122 lb 8 oz (55.6 kg)

## 2023-08-10 NOTE — Assessment & Plan Note (Addendum)
 CT head and chest negative for acute findings. VS stable. Monitor for worsening symptoms. Seek immediate care for severe headache, vomiting, confusion, weakness, or loss of consciousnes.

## 2023-08-11 ENCOUNTER — Ambulatory Visit: Admitting: Student

## 2023-08-11 ENCOUNTER — Encounter: Payer: Self-pay | Admitting: Student

## 2023-08-11 VITALS — BP 160/88 | HR 66 | Temp 98.5°F | Resp 16 | Ht 63.0 in | Wt 122.8 lb

## 2023-08-11 DIAGNOSIS — E785 Hyperlipidemia, unspecified: Secondary | ICD-10-CM

## 2023-08-11 DIAGNOSIS — R634 Abnormal weight loss: Secondary | ICD-10-CM | POA: Diagnosis not present

## 2023-08-11 DIAGNOSIS — W19XXXD Unspecified fall, subsequent encounter: Secondary | ICD-10-CM | POA: Diagnosis not present

## 2023-08-11 DIAGNOSIS — F411 Generalized anxiety disorder: Secondary | ICD-10-CM | POA: Diagnosis not present

## 2023-08-11 DIAGNOSIS — I10 Essential (primary) hypertension: Secondary | ICD-10-CM

## 2023-08-11 DIAGNOSIS — J4540 Moderate persistent asthma, uncomplicated: Secondary | ICD-10-CM

## 2023-08-11 DIAGNOSIS — E538 Deficiency of other specified B group vitamins: Secondary | ICD-10-CM | POA: Diagnosis not present

## 2023-08-11 DIAGNOSIS — G3184 Mild cognitive impairment, so stated: Secondary | ICD-10-CM | POA: Diagnosis not present

## 2023-08-11 DIAGNOSIS — Z0000 Encounter for general adult medical examination without abnormal findings: Secondary | ICD-10-CM

## 2023-08-11 LAB — COMPREHENSIVE METABOLIC PANEL WITH GFR
ALT: 9 U/L (ref 0–35)
AST: 11 U/L (ref 0–37)
Albumin: 4 g/dL (ref 3.5–5.2)
Alkaline Phosphatase: 60 U/L (ref 39–117)
BUN: 15 mg/dL (ref 6–23)
CO2: 32 meq/L (ref 19–32)
Calcium: 10.3 mg/dL (ref 8.4–10.5)
Chloride: 106 meq/L (ref 96–112)
Creatinine, Ser: 0.84 mg/dL (ref 0.40–1.20)
GFR: 70.15 mL/min (ref >60.00)
Glucose, Bld: 92 mg/dL (ref 70–99)
Potassium: 4.5 meq/L (ref 3.5–5.1)
Sodium: 143 meq/L (ref 135–145)
Total Bilirubin: 0.3 mg/dL (ref 0.2–1.2)
Total Protein: 6.6 g/dL (ref 6.0–8.3)

## 2023-08-11 LAB — CBC WITH DIFFERENTIAL/PLATELET
Basophils Absolute: 0 10*3/uL (ref 0.0–0.1)
Basophils Relative: 0.5 % (ref 0.0–3.0)
Eosinophils Absolute: 0.1 10*3/uL (ref 0.0–0.7)
Eosinophils Relative: 1.6 % (ref 0.0–5.0)
HCT: 36.3 % (ref 36.0–46.0)
Hemoglobin: 11.7 g/dL — ABNORMAL LOW (ref 12.0–15.0)
Lymphocytes Relative: 8.4 % — ABNORMAL LOW (ref 12.0–46.0)
Lymphs Abs: 0.5 10*3/uL — ABNORMAL LOW (ref 0.7–4.0)
MCHC: 32.1 g/dL (ref 30.0–36.0)
MCV: 98.7 fl (ref 78.0–100.0)
Monocytes Absolute: 0.4 10*3/uL (ref 0.1–1.0)
Monocytes Relative: 5.9 % (ref 3.0–12.0)
Neutro Abs: 5.4 10*3/uL (ref 1.4–7.7)
Neutrophils Relative %: 83.6 % — ABNORMAL HIGH (ref 43.0–77.0)
Platelets: 249 10*3/uL (ref 150.0–400.0)
RBC: 3.68 Mil/uL — ABNORMAL LOW (ref 3.87–5.11)
RDW: 13.7 % (ref 11.5–15.5)
WBC: 6.5 10*3/uL (ref 4.0–10.5)

## 2023-08-11 LAB — POCT URINALYSIS DIP (MANUAL ENTRY)
Bilirubin, UA: NEGATIVE
Blood, UA: NEGATIVE
Glucose, UA: NEGATIVE mg/dL
Ketones, POC UA: NEGATIVE mg/dL
Nitrite, UA: NEGATIVE
Protein Ur, POC: NEGATIVE mg/dL
Spec Grav, UA: <=1.005 — AB (ref 1.010–1.025)
Urobilinogen, UA: 0.2 U/dL
pH, UA: 7 (ref 5.0–8.0)

## 2023-08-11 LAB — LIPID PANEL
Cholesterol: 151 mg/dL (ref 0–200)
HDL: 61.2 mg/dL (ref >39.00)
LDL Cholesterol: 68 mg/dL (ref 0–99)
NonHDL: 90.21
Total CHOL/HDL Ratio: 2
Triglycerides: 112 mg/dL (ref 0.0–149.0)
VLDL: 22.4 mg/dL (ref 0.0–40.0)

## 2023-08-11 LAB — HEMOGLOBIN A1C: Hgb A1c MFr Bld: 5.5 % (ref 4.6–6.5)

## 2023-08-11 LAB — VITAMIN D 25 HYDROXY (VIT D DEFICIENCY, FRACTURES): VITD: 38.29 ng/mL (ref 30.00–100.00)

## 2023-08-11 LAB — VITAMIN B12: Vitamin B-12: 514 pg/mL (ref 211–911)

## 2023-08-11 LAB — TSH: TSH: 0.51 u[IU]/mL (ref 0.35–5.50)

## 2023-08-11 NOTE — Addendum Note (Signed)
 Addended by: ESTELLE GILLIS D on: 08/11/2023 02:53 PM   Modules accepted: Orders

## 2023-08-11 NOTE — Assessment & Plan Note (Signed)
 Patient encouraged to maintain heart healthy diet, regular exercise, adequate sleep. Consider daily probiotics. Take medications as prescribed. Labs ordered and reviewed.   HCM: -Mammogram: Last 04/2023- WNL. Repeat 1-2 years. -DEXA: Last 2022, WNL. Repeat 2-3 yr - Colonoscopy: , Last in 2022, during which two adenomatous polyps were removed. Repeat 10 years (2032) - Immunizations: Covid-19 due

## 2023-08-12 LAB — URINE CULTURE
MICRO NUMBER:: 16657781
Result:: NO GROWTH
SPECIMEN QUALITY:: ADEQUATE

## 2023-08-16 ENCOUNTER — Ambulatory Visit: Payer: Self-pay | Admitting: Student

## 2023-08-23 ENCOUNTER — Ambulatory Visit: Admitting: Nurse Practitioner

## 2023-08-23 ENCOUNTER — Encounter: Payer: Self-pay | Admitting: Nurse Practitioner

## 2023-08-23 VITALS — BP 120/70 | HR 70 | Ht 64.0 in | Wt 121.4 lb

## 2023-08-23 DIAGNOSIS — D869 Sarcoidosis, unspecified: Secondary | ICD-10-CM

## 2023-08-23 DIAGNOSIS — J4540 Moderate persistent asthma, uncomplicated: Secondary | ICD-10-CM | POA: Diagnosis not present

## 2023-08-23 NOTE — Patient Instructions (Addendum)
 Continue Albuterol  inhaler 2 puffs or 3 mL neb every 6 hours as needed for shortness of breath or wheezing. Notify if symptoms persist despite rescue inhaler/neb use.  Continue up Symbicort  to 160 mcg dose - 2 puffs Twice daily. Brush tongue and rinse mouth afterwards. Use with spacer. Do not use Advair (the purple inhaler) Continue azelastine  nasal spray Use daily over the counter allergy pill such as claritin, zyrtec, xyzal    Follow up in 3 months for asthma follow up with Dr. Kassie. If symptoms do not improve or worsen, please contact office for sooner follow up or seek emergency care.

## 2023-08-23 NOTE — Progress Notes (Unsigned)
 @Patient  ID: Casey Rowe, female    DOB: [birth year], 73 y.o.   MRN: [id number]  Chief Complaint  Patient presents with   Follow-up    Asthma is well controlled     Referring provider: Domenica Harlene LABOR, MD  HPI: 73 year female, never smoker followed for asthma and sarcoidosis. She is a patient of Dr. Laymond and last seen in office 08/03/2023. Past medical history significant for HTN, IBS, cognitive impairment, HLD, anxiety.   TEST/EVENTS:  08/19/2021 PFT: FVC 102, FEV1 105, ratio 75, TLC 117, DLCO 91 05/18/2023 CTA chest: no PE. No acute pulm findings. 08/03/2023 CXR: clear   06/06/2023: OV with Dr. Kassie. Saw Dr. 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 Prescribed prolonged course of steroids. CXR in April negative. Currently not been needing nebulizer. Using Advair diskus; only using 2-3 days a week. Having hoarseness. Reduced ICS load to improve compliance and change to HFA. 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   08/03/2023: SHERLEAN with Qaadir Kent NP Discussed the use of AI scribe software for clinical note transcription with the patient, who gave verbal consent to proceed. Casey Rowe is a 73 year old female who presents for follow up.  She is experiencing respiratory issues, which she attributes to the weather and allergies. She has been using her Symbicort  inhaler and nebulizers frequently, stating 'I'm on nebulizers like, uh, a lot now.' Her breathing difficulties worsen during hay fever season and when the heat is on. She has not had a breathing treatment in a couple of days, although she was previously doing them twice a day. She is coughing up green phlegm. Has some wheezing. No hemoptysis, fevers, chills.  She mentions a fall last night in the parking lot. Now having difficulty thinking clearly and feels more confused. When she fell, she hit her head, shoulder, and knees on the concrete. She has a lot of pain in her left  knee as well. Left elbow is sore. She denies any headaches, vision changes, gait instability. No lethargy.     08/23/2023: Today - follow up Patient presents today for follow up. She was treated for asthma exacebation at her last visit with prednisone  and z pack; although she does not seem to recall this. She does feel better. She's not used her nebulizer in the last few weeks she tells me, which would coincide with her treatment. She does seem to have some confusion regarding inhaler therapies. She uses Symbicort  twice daily but reports also using Advair Diskus. Unclear why both were filled; no active rx for Advair that I can see. D/c by Dr. Kassie in April 2025. She is still using her albuterol  inhaler a few times a week. No significant cough now. Seems to have some recall difficulties. She's recovered from her fall. No fevers, chills, hemoptysis, chest congestion, PND, wheezing.  Allergies  Allergen Reactions   Demerol Nausea And Vomiting   Singulair  [Montelukast ]     Dry mouth/dehydration   Augmentin  [Amoxicillin -Pot Clavulanate] Diarrhea    Caused Diarrhea    Immunization History  Administered Date(s) Administered   DTaP 08/09/2010   Fluad  Quad(high Dose 65+) 01/17/2020, 11/14/2020, 01/13/2022   Fluad  Trivalent(High Dose 65+) 11/02/2022   Influenza Split 02/22/2011   Influenza, High Dose Seasonal PF 04/22/2018, 11/04/2018   Influenza,inj,Quad PF,6+ Mos 01/30/2014, 03/11/2015, 03/16/2016   PFIZER Comirnaty (Gray Top)Covid-19 Tri-Sucrose Vaccine 05/20/2020   PFIZER(Purple Top)SARS-COV-2 Vaccination 03/15/2019, 04/05/2019, 11/15/2019, 12/05/2019, 10/21/2020  PNEUMOCOCCAL CONJUGATE-20 03/29/2022   Pfizer Covid-19 Vaccine Bivalent Booster 10yrs & up 08/19/2021   Pfizer(Comirnaty )Fall Seasonal Vaccine 12 years and older 11/27/2021, 11/02/2022   Pneumococcal Polysaccharide-23 11/04/2018   Tdap 11/04/2018, 08/14/2020, 08/14/2020   Zoster Recombinant(Shingrix ) 08/14/2020, 11/14/2020    Zoster, Live 03/11/2015    Past Medical History:  Diagnosis Date   Abdominal pain 08/08/2020   Acute nonsuppurative otitis media of left ear 12/22/2014   Allergy    Anorexia 01/14/2021   600 calories/daily   Asthma exacerbation 02/22/2012   Broken jaw    1980s; fall down flight of stairs   Callus of foot 12/28/2017   Coccydynia 04/08/2020   Concussion 06/2022   Fall at home    Conjunctivitis 04/04/2022   Fall 03/27/2017   At work on November 11, 2016, following with workman's comp  Last Assessment & Plan:   Clemens at work on 11/09/16 and has been following with [employer]. She   fell up some steps onto her knees and has been left with chronic pain.     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 468 mL 1   albuterol  (VENTOLIN  HFA) 108 (90 Base) MCG/ACT inhaler Inhale 1-2 puffs  into the lungs every 4 (four) hours as needed for wheezing or shortness of breath. 18 g 5   azithromycin  (ZITHROMAX ) 250 MG tablet Take 2 tablets on day one then take 1 tablet daily for four additional days 6 tablet 0   budesonide -formoterol  (SYMBICORT ) 160-4.5 MCG/ACT inhaler Inhale 2 puffs into the lungs in the morning and at bedtime. 1 each 6   buPROPion  (WELLBUTRIN  XL) 150 MG 24 hr tablet Take a 300 mg tab and a 150 mg tab daily for a total daily dose of 450 mg 30 tablet 5   buPROPion  (WELLBUTRIN  XL) 300 MG 24 hr tablet Take a 300 mg tab and a 150 mg tab daily for a total daily dose of 450 mg     busPIRone  (BUSPAR ) 15 MG tablet Take 1 tablet (15 mg total) by mouth 3 (three) times daily. 270 tablet 1   fenofibrate  micronized (LOFIBRA) 134 MG capsule TAKE ONE CAPSULE BY MOUTH EVERY MORNING BEFORE BREAKFAST 90 capsule 1   gabapentin (NEURONTIN) 300 MG capsule Take 300 mg by mouth 3 (three) times daily.     losartan  (COZAAR ) 50 MG tablet TAKE 1 TABLET BY MOUTH DAILY 90  tablet 1   metoprolol  succinate (TOPROL -XL) 50 MG 24 hr tablet TAKE 1 TABLET BY MOUTH DAILY WITH A MEAL 90 tablet 1   Nebulizer MISC Use w/ nebulized medication q 6 hours 1 each 0   predniSONE  (DELTASONE ) 10 MG tablet 4 tabs for 3 days, then 3 tabs for 3 days, 2 tabs for 3 days, then 1 tab for 3 days, then stop 30 tablet 0   Respiratory Therapy Supplies (NEBULIZER MASK ADULT/TUBING) MISC 1 each by Does not apply route every 6 (six) hours. Use w/ nebulized med q 6 hours 1 each 0   Spacer/Aero-Holding Chambers (OPTICHAMBER DIAMOND -LG MASK) DEVI Dispense one 1 each 0   No facility-administered medications prior to visit.     Review of Systems:   Constitutional: No weight loss or gain, night sweats, fevers, chills, or lassitude. +fatigue (baseline) HEENT: No headaches, difficulty swallowing, tooth/dental problems, or sore throat. No sneezing, itching, ear ache, nasal congestion, or post nasal drip CV:  No chest pain, orthopnea, PND,  swelling in lower extremities, anasarca, dizziness, palpitations, syncope Resp: +shortness of breath with exertion (improved; stable). No cough. No wheezing. No hemoptysis. No chest wall deformity GI:  No heartburn, indigestion GU: No dysuria, change in color of urine, urgency or frequency.  Skin: No rash, lesions, ulcerations Neuro: +memory impairment  Psych: No depression or anxiety. Mood stable.     Physical Exam:  BP 120/70 (BP Location: Left Arm, Patient Position: Sitting, Cuff Size: Normal)   Pulse 70   Ht 5' 4 (1.626 m)   Wt 121 lb 6.4 oz (55.1 kg)   LMP 02/08/1997   SpO2 99%   BMI 20.84 kg/m   GEN: Pleasant, interactive, well-kempt; in no acute distress HEENT:  Normocephalic and atraumatic. PERRLA. Sclera white. Nasal turbinates pink, moist and patent bilaterally. No rhinorrhea present. Oropharynx pink and moist, without exudate or edema. No lesions, ulcerations, or postnasal drip.  NECK:  Supple w/ fair ROM. No lymphadenopathy.   CV: RRR, no m/r/g, no peripheral edema. Pulses intact, +2 bilaterally. No cyanosis, pallor or clubbing. PULMONARY:  Unlabored, regular breathing. Clear b/l A&P w/o wheezes/rales/rhonchi. No accessory muscle use.  GI: BS present and normoactive. Soft, non-tender to palpation. No organomegaly or masses detected.  MSK: No deformities, warmth or tenderness. Cap refil <2 sec all extrem. Neuro: A/Ox3. Short term memory recall impairment  Skin: Warm, no lesions or rashe Psych: Normal affect and behavior.     Lab Results:  CBC    Component Value Date/Time   WBC 6.5 08/11/2023 1221   RBC 3.68 (L) 08/11/2023 1221   HGB 11.7 (L) 08/11/2023 1221   HCT 36.3 08/11/2023 1221   PLT 249.0 08/11/2023 1221   MCV 98.7 08/11/2023 1221   MCH 32.1 05/18/2023 1413   MCHC 32.1 08/11/2023 1221   RDW 13.7 08/11/2023 1221   LYMPHSABS 0.5 (L) 08/11/2023 1221   MONOABS 0.4 08/11/2023 1221   EOSABS 0.1 08/11/2023 1221   BASOSABS 0.0 08/11/2023 1221    BMET     Component Value Date/Time   NA 143 08/11/2023 1221   K 4.5 08/11/2023 1221   CL 106 08/11/2023 1221   CO2 32 08/11/2023 1221   GLUCOSE 92 08/11/2023 1221   BUN 15 08/11/2023 1221   CREATININE 0.84 08/11/2023 1221   CREATININE 0.72 02/11/2014 0900   CALCIUM 10.3 08/11/2023 1221   GFRNONAA 57 (L) 05/18/2023 1413   GFRNONAA >89 02/11/2014 0900   GFRAA >89 02/11/2014 0900    BNP  Component Value Date/Time   BNP 423.3 (H) 03/26/2021 2202     Imaging:  CT Head Wo Contrast Result Date: 08/03/2023 CLINICAL DATA:  Polytrauma, blunt.  Fall. EXAM: CT HEAD WITHOUT CONTRAST CT CERVICAL SPINE WITHOUT CONTRAST TECHNIQUE: Multidetector CT imaging of the head and cervical spine was performed following the standard protocol without intravenous contrast. Multiplanar CT image reconstructions of the cervical spine were also generated. RADIATION DOSE REDUCTION: This exam was performed according to the departmental dose-optimization program which includes automated exposure control, adjustment of the mA and/or kV according to patient size and/or use of iterative reconstruction technique. COMPARISON:  CT scan head from 10/03/2022 and cervical spine from 06/21/2022. FINDINGS: CT HEAD FINDINGS Brain: No evidence of acute infarction, hemorrhage, hydrocephalus, extra-axial collection or mass lesion/mass effect. There is bilateral periventricular hypodensity, which is non-specific but most likely seen in the settings of microvascular ischemic changes. Mild in extent. Otherwise normal appearance of brain parenchyma. Ventricles are normal. Cerebral volume is age appropriate. Vascular: No hyperdense vessel or unexpected calcification. Intracranial arteriosclerosis. Skull: Normal. Negative for fracture or focal lesion. Sinuses/Orbits: No acute finding. Moderate to severe mucoperiosteal thickening noted in the bilateral ethmoidal air cells and there is complete opacification of bilateral frontal sinus. Mild mucosal  thickening noted in the bilateral maxillary sinuses and sphenoid sinus. No air-fluid levels. Other: Visualized mastoid air cells are unremarkable. No mastoid effusion. CT CERVICAL SPINE FINDINGS Alignment: There is loss of cervical lordosis, which may be positional. There is grade 1 retrolisthesis of C5 over C6, most likely degenerative and essentially similar to the prior study. This examination does not assess for ligamentous injury or stability. Skull base and vertebrae: No acute fracture. No primary bone lesion or focal pathologic process. Soft tissues and spinal canal: No prevertebral fluid or swelling. No visible canal hematoma. Disc levels: Moderate to severely reduced C5-C6 intervertebral disc height and mild-to-moderately reduced C6-7 through T1-T2 disc heights. Remaining intervertebral disc heights are maintained. Minimal multilevel facet arthropathy and marginal osteophyte formation. There is fusion of right C4-5 facet. Upper chest: Negative. Other: None. IMPRESSION: 1. No acute intracranial abnormality. 2. No acute osseous injury or traumatic listhesis of the cervical spine. 3. Multiple other nonacute observations, as described above. Electronically Signed   By: Ree Molt M.D.   On: 08/03/2023 13:33   CT Cervical Spine Wo Contrast Result Date: 08/03/2023 CLINICAL DATA:  Polytrauma, blunt.  Fall. EXAM: CT HEAD WITHOUT CONTRAST CT CERVICAL SPINE WITHOUT CONTRAST TECHNIQUE: Multidetector CT imaging of the head and cervical spine was performed following the standard protocol without intravenous contrast. Multiplanar CT image reconstructions of the cervical spine were also generated. RADIATION DOSE REDUCTION: This exam was performed according to the departmental dose-optimization program which includes automated exposure control, adjustment of the mA and/or kV according to patient size and/or use of iterative reconstruction technique. COMPARISON:  CT scan head from 10/03/2022 and cervical spine from  06/21/2022. FINDINGS: CT HEAD FINDINGS Brain: No evidence of acute infarction, hemorrhage, hydrocephalus, extra-axial collection or mass lesion/mass effect. There is bilateral periventricular hypodensity, which is non-specific but most likely seen in the settings of microvascular ischemic changes. Mild in extent. Otherwise normal appearance of brain parenchyma. Ventricles are normal. Cerebral volume is age appropriate. Vascular: No hyperdense vessel or unexpected calcification. Intracranial arteriosclerosis. Skull: Normal. Negative for fracture or focal lesion. Sinuses/Orbits: No acute finding. Moderate to severe mucoperiosteal thickening noted in the bilateral ethmoidal air cells and there is complete opacification of bilateral frontal sinus. Mild mucosal thickening noted in  the bilateral maxillary sinuses and sphenoid sinus. No air-fluid levels. Other: Visualized mastoid air cells are unremarkable. No mastoid effusion. CT CERVICAL SPINE FINDINGS Alignment: There is loss of cervical lordosis, which may be positional. There is grade 1 retrolisthesis of C5 over C6, most likely degenerative and essentially similar to the prior study. This examination does not assess for ligamentous injury or stability. Skull base and vertebrae: No acute fracture. No primary bone lesion or focal pathologic process. Soft tissues and spinal canal: No prevertebral fluid or swelling. No visible canal hematoma. Disc levels: Moderate to severely reduced C5-C6 intervertebral disc height and mild-to-moderately reduced C6-7 through T1-T2 disc heights. Remaining intervertebral disc heights are maintained. Minimal multilevel facet arthropathy and marginal osteophyte formation. There is fusion of right C4-5 facet. Upper chest: Negative. Other: None. IMPRESSION: 1. No acute intracranial abnormality. 2. No acute osseous injury or traumatic listhesis of the cervical spine. 3. Multiple other nonacute observations, as described above. Electronically  Signed   By: Ree Molt M.D.   On: 08/03/2023 13:33   DG Elbow Complete Left Result Date: 08/03/2023 CLINICAL DATA:  Fall. EXAM: LEFT ELBOW - COMPLETE 3+ VIEW COMPARISON:  None Available. FINDINGS: No acute fracture or dislocation. No aggressive osseous lesion. Mild degenerative arthritis of elbow joint. No radiopaque foreign bodies. Soft tissues are within normal limits. IMPRESSION: *No acute osseous abnormality of the left elbow joint. Electronically Signed   By: Ree Molt M.D.   On: 08/03/2023 13:26   DG Chest 2 View Result Date: 08/03/2023 CLINICAL DATA:  Cough.  Fall. EXAM: CHEST - 2 VIEW COMPARISON:  05/18/2023. FINDINGS: Bilateral lung fields are clear. Bilateral costophrenic angles are clear. Normal cardio-mediastinal silhouette. No acute osseous abnormalities. The soft tissues are within normal limits. IMPRESSION: No active cardiopulmonary disease. Electronically Signed   By: Ree Molt M.D.   On: 08/03/2023 13:25   DG Knee Complete 4 Views Left Result Date: 08/03/2023 CLINICAL DATA:  Fall. EXAM: LEFT KNEE - COMPLETE 4+ VIEW COMPARISON:  None Available. FINDINGS: No acute fracture or dislocation. No aggressive osseous lesion. There are degenerative changes of the knee joint in the form of mildly reduced medial tibio-femoral compartment joint space, tibial spiking and osteophytosis. No knee effusion or focal soft tissue swelling. No radiopaque foreign bodies. IMPRESSION: *No acute osseous abnormality of the left knee joint. Mild degenerative changes of the left knee joint. Electronically Signed   By: Ree Molt M.D.   On: 08/03/2023 13:24    albuterol  (PROVENTIL ) (2.5 MG/3ML) 0.083% nebulizer solution 2.5 mg     Date Action Dose Route User   Discharged on 08/03/2023   Admitted on 08/03/2023   08/03/2023 1101 Given 2.5 mg Nebulization Busick, Ashlyn T, CMA      methylPREDNISolone  acetate (DEPO-MEDROL ) injection 120 mg     Date Action Dose Route User   Discharged on  08/03/2023   Admitted on 08/03/2023   08/03/2023 1108 Given 120 mg Intramuscular (Right Ventrogluteal) Busick, Ashlyn T, CMA          Latest Ref Rng & Units 08/19/2021   12:47 PM  PFT Results  FVC-Pre L 3.13   FVC-Predicted Pre % 102   FVC-Post L 3.31   FVC-Predicted Post % 107   Pre FEV1/FVC % % 79   Post FEV1/FCV % % 75   FEV1-Pre L 2.47   FEV1-Predicted Pre % 105   FEV1-Post L 2.48   DLCO uncorrected ml/min/mmHg 17.96   DLCO UNC% % 91   DLCO corrected ml/min/mmHg  17.85   DLCO COR %Predicted % 91   DLVA Predicted % 91   TLC L 5.94   TLC % Predicted % 117   RV % Predicted % 139     No results found for: NITRICOXIDE      Assessment & Plan:   Asthmatic bronchitis, moderate persistent, uncomplicated Resolved exacerbation post steroid/empiric azithromycin . Clinically improved with decreased SABA use. Advised to stop Advair and continue Symbicort . Recommend bringing her medications to her follow up. Trigger prevention reviewed. Action plan in place.  Patient Instructions  Continue Albuterol  inhaler 2 puffs or 3 mL neb every 6 hours as needed for shortness of breath or wheezing. Notify if symptoms persist despite rescue inhaler/neb use.  Continue up Symbicort  to 160 mcg dose - 2 puffs Twice daily. Brush tongue and rinse mouth afterwards. Use with spacer. Do not use Advair (the purple inhaler) Continue azelastine  nasal spray Use daily over the counter allergy pill such as claritin, zyrtec, xyzal    Follow up in 3 months for asthma follow up with Dr. Kassie. If symptoms do not improve or worsen, please contact office for sooner follow up or seek emergency care.    Sarcoidosis Dx in 1980's on lung biopsy. Quiescent since 80s. No evidence of active disease on recent imaging.     Advised if symptoms do not improve or worsen, to please contact office for sooner follow up or seek emergency care.   I spent 25 minutes of dedicated to the care of this patient on the date of  this encounter to include pre-visit review of records, face-to-face time with the patient discussing conditions above, post visit ordering of testing, clinical documentation with the electronic health record, making appropriate referrals as documented, and communicating necessary findings to members of the patients care team.  Comer LULLA Rouleau, NP 08/25/2023  Pt aware and understands NP's role.

## 2023-08-25 ENCOUNTER — Encounter: Payer: Self-pay | Admitting: Nurse Practitioner

## 2023-08-25 NOTE — Assessment & Plan Note (Signed)
 Dx in 1980's on lung biopsy. Quiescent since 9s. No evidence of active disease on recent imaging.

## 2023-08-25 NOTE — Assessment & Plan Note (Signed)
 Resolved exacerbation post steroid/empiric azithromycin . Clinically improved with decreased SABA use. Advised to stop Advair and continue Symbicort . Recommend bringing her medications to her follow up. Trigger prevention reviewed. Action plan in place.  Patient Instructions  Continue Albuterol  inhaler 2 puffs or 3 mL neb every 6 hours as needed for shortness of breath or wheezing. Notify if symptoms persist despite rescue inhaler/neb use.  Continue up Symbicort  to 160 mcg dose - 2 puffs Twice daily. Brush tongue and rinse mouth afterwards. Use with spacer. Do not use Advair (the purple inhaler) Continue azelastine  nasal spray Use daily over the counter allergy pill such as claritin, zyrtec, xyzal    Follow up in 3 months for asthma follow up with Dr. Kassie. If symptoms do not improve or worsen, please contact office for sooner follow up or seek emergency care.

## 2023-09-03 ENCOUNTER — Other Ambulatory Visit: Payer: Self-pay | Admitting: Family Medicine

## 2023-09-05 ENCOUNTER — Encounter: Payer: Self-pay | Admitting: *Deleted

## 2023-09-06 ENCOUNTER — Ambulatory Visit

## 2023-09-07 ENCOUNTER — Ambulatory Visit (INDEPENDENT_AMBULATORY_CARE_PROVIDER_SITE_OTHER)

## 2023-09-07 VITALS — BP 120/70 | Ht 64.0 in | Wt 116.0 lb

## 2023-09-07 DIAGNOSIS — Z0000 Encounter for general adult medical examination without abnormal findings: Secondary | ICD-10-CM

## 2023-09-07 DIAGNOSIS — Z2821 Immunization not carried out because of patient refusal: Secondary | ICD-10-CM

## 2023-09-07 NOTE — Progress Notes (Signed)
 Because this visit was a virtual/telehealth visit,  certain criteria was not obtained, such a blood pressure, CBG if applicable, and timed get up and go. Any medications not marked as taking were not mentioned during the medication reconciliation part of the visit. Any vitals not documented were not able to be obtained due to this being a telehealth visit or patient was unable to self-report a recent blood pressure reading due to a lack of equipment at home via telehealth. Vitals that have been documented are verbally provided by the patient.  This visit was performed by a medical professional under my direct supervision. I was immediately available for consultation/collaboration. I have reviewed and agree with the Annual Wellness Visit documentation.  Subjective:   Casey Rowe is a 73 y.o. who presents for a Medicare Wellness preventive visit.  As a reminder, Annual Wellness Visits don't include a physical exam, and some assessments may be limited, especially if this visit is performed virtually. We may recommend an in-person follow-up visit with your provider if needed.  Visit Complete: Virtual I connected with  Niels LITTIE Charleston on 09/07/23 by a audio enabled telemedicine application and verified that I am speaking with the correct person using two identifiers.  Patient Location: Home  Provider Location: Home Office  I discussed the limitations of evaluation and management by telemedicine. The patient expressed understanding and agreed to proceed.  Vital Signs: Because this visit was a virtual/telehealth visit, some criteria may be missing or patient reported. Any vitals not documented were not able to be obtained and vitals that have been documented are patient reported.  VideoDeclined- This patient declined Librarian, academic. Therefore the visit was completed with audio only.  Persons Participating in Visit: Patient.  AWV Questionnaire: Yes: Patient  Medicare AWV questionnaire was completed by the patient on 09/06/2023; I have confirmed that all information answered by patient is correct and no changes since this date.  Cardiac Risk Factors include: advanced age (>46men, >17 women);hypertension;dyslipidemia     Objective:    Today's Vitals   09/07/23 1039  BP: 120/70  Weight: 116 lb (52.6 kg)  Height: 5' 4 (1.626 m)   Body mass index is 19.91 kg/m.     09/07/2023   10:39 AM 08/03/2023   11:30 AM 10/22/2022    1:17 PM 10/03/2022   10:56 AM 07/16/2022    9:45 AM 06/21/2022    8:39 PM 03/26/2021    9:34 PM  Advanced Directives  Does Patient Have a Medical Advance Directive? No No No No No No No  Would patient like information on creating a medical advance directive? No - Patient declined  No - Patient declined  No - Patient declined      Current Medications (verified) Outpatient Encounter Medications as of 09/07/2023  Medication Sig   albuterol  (PROVENTIL ) (2.5 MG/3ML) 0.083% nebulizer solution Take 3 mLs (2.5 mg total) by nebulization every 6 (six) hours as needed for wheezing or shortness of breath.   albuterol  (VENTOLIN  HFA) 108 (90 Base) MCG/ACT inhaler Inhale 1-2 puffs into the lungs every 4 (four) hours as needed for wheezing or shortness of breath.   azithromycin  (ZITHROMAX ) 250 MG tablet Take 2 tablets on day one then take 1 tablet daily for four additional days   budesonide -formoterol  (SYMBICORT ) 160-4.5 MCG/ACT inhaler Inhale 2 puffs into the lungs in the morning and at bedtime.   buPROPion  (WELLBUTRIN  XL) 150 MG 24 hr tablet Take a 300 mg tab and a 150 mg tab  daily for a total daily dose of 450 mg   buPROPion  (WELLBUTRIN  XL) 300 MG 24 hr tablet Take a 300 mg tab and a 150 mg tab daily for a total daily dose of 450 mg   busPIRone  (BUSPAR ) 15 MG tablet Take 1 tablet (15 mg total) by mouth 3 (three) times daily.   fenofibrate  micronized (LOFIBRA) 134 MG capsule TAKE ONE CAPSULE BY MOUTH EVERY MORNING BEFORE BREAKFAST    gabapentin (NEURONTIN) 300 MG capsule Take 300 mg by mouth 3 (three) times daily.   losartan  (COZAAR ) 50 MG tablet TAKE 1 TABLET BY MOUTH DAILY   metoprolol  succinate (TOPROL -XL) 50 MG 24 hr tablet TAKE 1 TABLET BY MOUTH DAILY WITH A MEAL   Nebulizer MISC Use w/ nebulized medication q 6 hours   predniSONE  (DELTASONE ) 10 MG tablet 4 tabs for 3 days, then 3 tabs for 3 days, 2 tabs for 3 days, then 1 tab for 3 days, then stop   Respiratory Therapy Supplies (NEBULIZER MASK ADULT/TUBING) MISC 1 each by Does not apply route every 6 (six) hours. Use w/ nebulized med q 6 hours   Spacer/Aero-Holding Chambers (OPTICHAMBER DIAMOND -LG MASK) DEVI Dispense one   No facility-administered encounter medications on file as of 09/07/2023.    Allergies (verified) Demerol, Singulair  [montelukast ], and Augmentin  [amoxicillin -pot clavulanate]   History: Past Medical History:  Diagnosis Date   Abdominal pain 08/08/2020   Acute nonsuppurative otitis media of left ear 12/22/2014   Allergy    Anorexia 01/14/2021   600 calories/daily   Asthma exacerbation 02/22/2012   Broken jaw    1980s; fall down flight of stairs   Callus of foot 12/28/2017   Coccydynia 04/08/2020   Concussion 06/2022   Fall at home    Conjunctivitis 04/04/2022   Fall 03/27/2017   At work on November 11, 2016, following with workman's comp  Last Assessment & Plan:   Clemens at work on 11/09/16 and has been following with [employer]. She   fell up some steps onto her knees and has been left with chronic pain.     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   Palpitation 03/24/2017   Rosacea 03/17/2020   Sarcoidosis    Sinusitis 01/18/2011   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   Vitamin B 12 deficiency 06/23/2022   Weight loss 11/07/2013   Past Surgical History:  Procedure Laterality Date   ABDOMINAL HYSTERECTOMY     CESAREAN SECTION     ECTOPIC PREGNANCY SURGERY  1981   FRACTURE SURGERY  1980s   broken jaw   Family History  Problem Relation Age of Onset   Hypertension Mother    Dementia Mother        multi-infarct   Diabetes Mother    Diabetes Father    Heart attack Father    Heart disease Father    Psoriasis Father    Cancer Sister        pancreatic   Alcohol  abuse Sister        substance   Hepatitis B Brother    Gout Brother    Hypertension Brother    Glaucoma Brother    Heart disease Brother    Rashes / Skin problems Son    Other Son        bad allergies, aspirin, dermatitis   Rashes / Skin problems Son    Cancer Maternal Aunt        breast   Breast cancer Maternal Aunt    Social History   Socioeconomic History   Marital status: Married    Spouse name: Not on file   Number of children: Not on file   Years of education: 18   Highest education level: Master's degree (e.g., MA, MS, MEng, MEd, MSW, MBA)  Occupational History   Occupation: Retired  Tobacco Use   Smoking status: Never   Smokeless tobacco: Never  Vaping Use   Vaping status: Never Used  Substance and Sexual Activity   Alcohol use: Yes    Alcohol/week: 14.0 - 21.0 standard drinks of alcohol    Types: 14 - 21 Glasses of wine per week    Comment: couple glasses of wine a lot of nights   Drug use: No   Sexual activity: Yes  Other Topics Concern   Not on file  Social History Narrative   Works as a Runner, 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MarriedGrown children- 3   Master in science    One  floor home   Right handed   Lives with husband   Retired   Drinks caffeine prn   Social Drivers of Corporate investment banker Strain: Medium Risk (09/06/2023)   Overall Financial Resource Strain (CARDIA)    Difficulty of Paying Living Expenses: Somewhat hard  Food Insecurity: No Food Insecurity (09/07/2023)   Hunger Vital Sign    Worried About Running Out of Food in the Last Year: Never true    Ran Out of Food in the Last Year: Never true  Transportation Needs: No Transportation Needs (09/06/2023)   PRAPARE - Administrator, Civil Service (Medical): No    Lack of Transportation (Non-Medical): No  Physical Activity: Insufficiently Active (09/06/2023)   Exercise Vital Sign    Days of Exercise per Week: 3 days    Minutes of Exercise per Session: 40 min  Stress: No Stress Concern Present (09/06/2023)   [employer] of Occupational Health - Occupational Stress Questionnaire    Feeling of Stress: Not at all  Social Connections: Socially Isolated (09/06/2023)   Social Connection and Isolation Panel    Frequency of Communication with Friends and Family: Once a week    Frequency of Social Gatherings with Friends and Family: Never    Attends Religious Services: Never    Database administrator or Organizations: No    Attends Engineer, structural: Patient unable to answer    Marital Status: Married    Tobacco Counseling Counseling given: Not Answered    Clinical Intake:  Pre-visit preparation completed: Yes  Pain : No/denies pain     BMI - recorded: 19.91 Nutritional Status: BMI of 19-24  Normal Nutritional Risks: None Diabetes: No  Lab Results  Component Value Date   HGBA1C 5.5 08/11/2023   HGBA1C 5.5 04/07/2023   HGBA1C 5.2 03/29/2022     How often do you need to have someone help you when you read instructions, pamphlets, or other written materials from your doctor or pharmacy?: 1 -  Never  Interpreter Needed?: No  Information entered by ::  Crosley Stejskal,CMA   Activities of Daily Living     09/06/2023    9:41 AM  In your present state of health, do you have any difficulty performing the following activities:  Hearing? 0  Vision? 0  Difficulty concentrating or making decisions? 1  Walking or climbing stairs? 0  Dressing or bathing? 0  Doing errands, shopping? 0  Preparing Food and eating ? N  Using the Toilet? N  In the past six months, have you accidently leaked urine? N  Do you have problems with loss of bowel control? N   Y  Managing your Medications? N  Managing your Finances? N  Housekeeping or managing your Housekeeping? N    Patient Care Team: Domenica Harlene LABOR, MD as PCP - General (Family Medicine) Rollene Charleston, MD (Unknown Physician Specialty) Victory Drivers, DPM (Podiatry) Joshua Pao, NP as Nurse Practitioner (Psychiatry) [facility], [facility]  I have updated your Care Teams any recent Medical Services you may have received from other providers in the past year.     Assessment:   This is a routine wellness examination for Tasnia.  Hearing/Vision screen Hearing Screening - Comments:: No difficulties Vision Screening - Comments:: Patient wears glasses    Goals Addressed             This Visit's Progress    Patient Stated       Patient declined       Depression Screen     09/07/2023   10:44 AM 04/07/2023   10:06 AM 03/29/2022    3:26 PM 02/16/2022   10:10 AM 02/16/2022   10:09 AM 08/25/2021    1:17 PM 06/04/2021    4:01 PM  PHQ 2/9 Scores  PHQ - 2 Score 0 0 0 0 0 0 0  PHQ- 9 Score 0  1 0  0 0    Fall Risk     09/06/2023    9:41 AM 08/11/2023   11:03 AM 04/07/2023   10:05 AM 10/22/2022    1:17 PM 09/23/2022   11:23 AM  Fall Risk   Falls in the past year? 0 1 1 0 1  Number falls in past yr: 0 0 0 0 1  Injury with Fall? 0 1 1 0 1  Risk for fall due to : No Fall Risks History of fall(s) No Fall Risks    Follow up Falls evaluation completed Falls evaluation completed  Falls evaluation completed Falls evaluation completed Falls evaluation completed    MEDICARE RISK AT HOME:  Medicare Risk at Home Any stairs in or around the home?: (Patient-Rptd) No If so, are there any without handrails?: (Patient-Rptd) No Home free of loose throw rugs in walkways, pet beds, electrical cords, etc?: (Patient-Rptd) Yes Adequate lighting in your home to reduce risk of falls?: (Patient-Rptd) Yes Life alert?: (Patient-Rptd) No Use of a cane, walker or w/c?: (Patient-Rptd) No Grab bars in the bathroom?: (Patient-Rptd) Yes Shower chair or bench in shower?: (Patient-Rptd) Yes Elevated toilet seat or a handicapped toilet?: (Patient-Rptd) No  TIMED UP AND GO:  Was the test performed?  No  Cognitive Function: 6CIT completed    08/11/2023   11:34 AM 10/04/2022    4:08 PM  MMSE - Mini Mental State Exam  Orientation to time 5 4  Orientation to Place 5 4  Registration 3 3  Attention/ Calculation 5 5  Recall 1 1  Language- name 2 objects 2  2  Language- repeat 1 1  Language- follow 3 step command 3 3  Language- read & follow direction 1 1  Write a sentence 1 1  Copy design 1 1  Total score 28 26      07/16/2022   12:00 PM  Montreal Cognitive Assessment   Visuospatial/ Executive (0/5) 4  Naming (0/3) 3  Attention: Read list of digits (0/2) 2  Attention: Read list of letters (0/1) 1  Attention: Serial 7 subtraction starting at 100 (0/3) 3  Language: Repeat phrase (0/2) 2  Language : Fluency (0/1) 0  Abstraction (0/2) 2  Delayed Recall (0/5) 1  Orientation (0/6) 6  Total 24  Adjusted Score (based on education) 24      09/07/2023   10:45 AM  6CIT Screen  What Year? 4 points  What month? 0 points  What time? 0 points  Count back from 20 0 points  Months in reverse 0 points  Repeat phrase 0 points  Total Score 4 points    Immunizations Immunization History  Administered Date(s) Administered   DTaP 08/09/2010   Fluad  Quad(high Dose 65+) 01/17/2020,  11/14/2020, 01/13/2022   Fluad  Trivalent(High Dose 65+) 11/02/2022   Influenza Split 02/22/2011   Influenza, High Dose Seasonal PF 04/22/2018, 11/04/2018   Influenza,inj,Quad PF,6+ Mos 01/30/2014, 03/11/2015, 03/16/2016   PFIZER Comirnaty (Gray Top)Covid-19 Tri-Sucrose Vaccine 05/20/2020   PFIZER(Purple Top)SARS-COV-2 Vaccination 03/15/2019, 04/05/2019, 11/15/2019, 12/05/2019, 10/21/2020   PNEUMOCOCCAL CONJUGATE-20 03/29/2022   Pfizer Covid-19 Vaccine Bivalent Booster 43yrs & up 08/19/2021   Pfizer(Comirnaty )Fall Seasonal Vaccine 12 years and older 11/27/2021, 11/02/2022   Pneumococcal Polysaccharide-23 11/04/2018   Tdap 11/04/2018, 08/14/2020, 08/14/2020   Zoster Recombinant(Shingrix ) 08/14/2020, 11/14/2020   Zoster, Live 03/11/2015    Screening Tests Health Maintenance  Topic Date Due   COVID-19 Vaccine (10 - 2024-25 season) 05/02/2023   INFLUENZA VACCINE  09/09/2023   Medicare Annual Wellness (AWV)  09/06/2024   MAMMOGRAM  04/17/2025   DTaP/Tdap/Td (5 - Td or Tdap) 08/15/2030   Colonoscopy  08/21/2030   Pneumococcal Vaccine: 50+ Years  Completed   DEXA SCAN  Completed   Hepatitis C Screening  Completed   Zoster Vaccines- Shingrix   Completed   Hepatitis B Vaccines  Aged Out   HPV VACCINES  Aged Out   Meningococcal B Vaccine  Aged Out   Fecal DNA (Cologuard)  Discontinued    Health Maintenance  Health Maintenance Due  Topic Date Due   COVID-19 Vaccine (10 - 2024-25 season) 05/02/2023   Health Maintenance Items Addressed:patient declined covid vaccination  Additional Screening:  Vision Screening: Recommended annual ophthalmology exams for early detection of glaucoma and other disorders of the eye. Would you like a referral to an eye doctor? No    Dental Screening: Recommended annual dental exams for proper oral hygiene  Community Resource Referral / Chronic Care Management: CRR required this visit?  No   CCM required this visit?  No   Plan:    I have  personally reviewed and noted the following in the patient's chart:   Medical and social history Use of alcohol, tobacco or illicit drugs  Current medications and supplements including opioid prescriptions. Patient is not currently taking opioid prescriptions. Functional ability and status Nutritional status Physical activity Advanced directives List of other physicians Hospitalizations, surgeries, and ER visits in previous 12 months Vitals Screenings to include cognitive, depression, and falls Referrals and appointments  In addition, I have reviewed and discussed with patient certain preventive protocols, quality metrics, and best  practice recommendations. A written personalized care plan for preventive services as well as general preventive health recommendations were provided to patient.   Lyle MARLA Right, [place]   09/07/2023   After Visit Summary: (MyChart) Due to this being a telephonic visit, the after visit summary with patients personalized plan was offered to patient via MyChart   Notes: Nothing significant to report at this time.

## 2023-09-07 NOTE — Patient Instructions (Signed)
 Casey Rowe , Thank you for taking time out of your busy schedule to complete your Annual Wellness Visit with me. I enjoyed our conversation and look forward to speaking with you again next year. I, as well as your care team,  appreciate your ongoing commitment to your health goals. Please review the following plan we discussed and let me know if I can assist you in the future. Your Game plan/ To Do List    Referrals: If you haven't heard from the office you've been referred to, please reach out to them at the phone provided.  none Follow up Visits: Next Medicare AWV with our clinical staff: 09/12/2024   Have you seen your provider in the last 6 months (3 months if uncontrolled diabetes)? No Next Office Visit with your provider: n/a  Clinician Recommendations:  Aim for 30 minutes of exercise or brisk walking, 6-8 glasses of water, and 5 servings of fruits and vegetables each day.       This is a list of the screening recommended for you and due dates:  Health Maintenance  Topic Date Due   COVID-19 Vaccine (10 - 2024-25 season) 05/02/2023   Flu Shot  09/09/2023   Medicare Annual Wellness Visit  09/06/2024   Mammogram  04/17/2025   DTaP/Tdap/Td vaccine (5 - Td or Tdap) 08/15/2030   Colon Cancer Screening  08/21/2030   Pneumococcal Vaccine for age over 73  Completed   DEXA scan (bone density measurement)  Completed   Hepatitis C Screening  Completed   Zoster (Shingles) Vaccine  Completed   Hepatitis B Vaccine  Aged Out   HPV Vaccine  Aged Out   Meningitis B Vaccine  Aged Out   Cologuard (Stool DNA test)  Discontinued    Advanced directives: (Declined) Advance directive discussed with you today. Even though you declined this today, please call our office should you change your mind, and we can give you the proper paperwork for you to fill out. Advance Care Planning is important because it:  [x]  Makes sure you receive the medical care that is consistent with your values, goals, and  preferences  [x]  It provides guidance to your family and loved ones and reduces their decisional burden about whether or not they are making the right decisions based on your wishes.  Follow the link provided in your after visit summary or read over the paperwork we have mailed to you to help you started getting your Advance Directives in place. If you need assistance in completing these, please reach out to us  so that we can help you!  See attachments for Preventive Care and Fall Prevention Tips.

## 2023-09-08 ENCOUNTER — Encounter: Payer: Self-pay | Admitting: Student

## 2023-09-14 ENCOUNTER — Encounter (HOSPITAL_BASED_OUTPATIENT_CLINIC_OR_DEPARTMENT_OTHER): Payer: Self-pay | Admitting: Pulmonary Disease

## 2023-09-14 DIAGNOSIS — D869 Sarcoidosis, unspecified: Secondary | ICD-10-CM

## 2023-09-14 DIAGNOSIS — J4541 Moderate persistent asthma with (acute) exacerbation: Secondary | ICD-10-CM

## 2023-09-14 MED ORDER — PREDNISONE 10 MG PO TABS: ORAL_TABLET | ORAL | 0 refills | Status: AC

## 2023-09-14 MED ORDER — ALBUTEROL SULFATE (2.5 MG/3ML) 0.083% IN NEBU: 2.5000 mg | INHALATION_SOLUTION | Freq: Four times a day (QID) | RESPIRATORY_TRACT | 1 refills | Status: DC | PRN

## 2023-09-19 ENCOUNTER — Ambulatory Visit: Admitting: Clinical

## 2023-09-19 DIAGNOSIS — F4321 Adjustment disorder with depressed mood: Secondary | ICD-10-CM | POA: Diagnosis not present

## 2023-09-19 NOTE — Progress Notes (Addendum)
 [facility] Counselor Initial Adult Exam - IN PERSON VISIT   Name: Casey Rowe Date: 09/19/2023 MRN: [id number] DOB: [birth year] PCP: Domenica Harlene LABOR, MD  Time spent: 55 min  Guardian/Payee:  Self    Paperwork requested: No   Reason for Visit /Presenting Problem: Family stressors  Mental Status Exam: Appearance:   Neat     Behavior:  Appropriate  Motor:  Normal  Speech/Language:   Normal Rate  Affect:  Appropriate and Tearful  Mood:  depressed  Thought process:  normal  Thought content:    WNL  Sensory/Perceptual disturbances:    WNL  Orientation:  oriented to person, place, and situation  Attention:  Good  Concentration:  Good  Memory:  WNL  Fund of knowledge:   Good  Insight:    Good  Judgment:   Good  Impulse Control:  Good    Reported Symptoms:  Feeling depressed about changes in her relationship with her husband  Risk Assessment: Danger to Self:  No Self-injurious Behavior: No Danger to Others: No Duty to Warn:no Physical Aggression / Violence:No  Access to Firearms a concern: Not asked Patient / guardian was educated about steps to take if suicide or homicide risk level increases between visits: no While future psychiatric events cannot be accurately predicted, the patient does not currently require acute inpatient psychiatric care and does not currently meet [place]  involuntary commitment criteria.   Past Psychiatric History:   Previous psychological history is significant for depression Outpatient Providers:Refer to chart   Abuse History:  Victim of:  Not reported   Report needed: No. Witness / Exposure to Domestic Violence: No    Family History:  Family History  Problem Relation Age of Onset   Hypertension Mother    Dementia Mother        multi-infarct   Diabetes Mother    Diabetes Father    Heart attack Father    Heart disease Father    Psoriasis Father    Cancer Sister        pancreatic   Alcohol abuse Sister         substance   Hepatitis B Brother    Gout Brother    Hypertension Brother    Glaucoma Brother    Heart disease Brother    Rashes / Skin problems Son    Other Son        bad allergies, aspirin, dermatitis   Rashes / Skin problems Son    Cancer Maternal Aunt        breast   Breast cancer Maternal Aunt     Living situation: the patient lives with their spouse  Sexual Orientation: Straight  Relationship Status: married  Name of spouse / other:Lee (2nd husband -married for over 20 years). 1st husband is bio father of 2nd & 3rd child. If a parent, number of children / ages:3 Adult Children Oldest son lives in [place] - Network engineer Daughter lives in [place] and has 4 children Youngest son lives in [place]  Support Systems: spouse and friends  Financial Stress:  No   Income/Employment/Disability: Supported by spouse  Financial planner: Not reported  Educational History: Education: post Engineer, maintenance (IT) work or degree. [employer]im[employer] school. 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  Religion/Sprituality/World View: Not reported  Any cultural differences that may affect / interfere with treatment:  None reported  Recreation/Hobbies: Working out at the  gym  Stressors: Health problems   Marital or family conflict   Health problems for herself as well as husband. She reported her husband had a major heart surgery about 6 months ago.  Strengths: Friends and Able to [employer]   Medical History/Surgical History: not reviewed Past Medical History:  Diagnosis Date   Abdominal pain 08/08/2020   Acute nonsuppurative otitis media of left ear 12/22/2014   Allergy    Anorexia 01/14/2021   600 calories/daily   Asthma exacerbation 02/22/2012   Broken jaw    1980s; fall down flight of stairs   Callus of foot 12/28/2017   Coccydynia 04/08/2020   Concussion 06/2022   Fall at home     Conjunctivitis 04/04/2022   Fall 03/27/2017   At work on November 11, 2016, following with workman's comp  Last Assessment & Plan:   Clemens at work on 11/09/16 and has been following with [employer] comp. She   fell up some steps onto her knees and has been left with chronic pain.     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   Palpitation 03/24/2017   Rosacea 03/17/2020   Sarcoidosis    Sinusitis 01/18/2011   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 360 mL 1   albuterol  (VENTOLIN  HFA) 108 (90 Base) MCG/ACT inhaler Inhale 1-2 puffs into the lungs every 4 (four) hours as needed for wheezing or shortness of breath. 18 g 5   azithromycin  (ZITHROMAX ) 250 MG tablet Take 2 tablets on day one then take 1 tablet daily for four additional days 6 tablet 0   budesonide -formoterol  (SYMBICORT ) 160-4.5 MCG/ACT inhaler Inhale 2 puffs into the lungs in the morning and at bedtime. 1  each 6   buPROPion  (WELLBUTRIN  XL) 150 MG 24 hr tablet Take a 300 mg tab and a 150 mg tab daily for a total daily dose of 450 mg 30 tablet 5   buPROPion  (WELLBUTRIN  XL) 300 MG 24 hr tablet Take a 300 mg tab and a 150 mg tab daily for a total daily dose of 450 mg     busPIRone  (BUSPAR ) 15 MG tablet Take 1 tablet (15 mg total) by mouth 3 (three) times daily. 270 tablet 1   fenofibrate  micronized (LOFIBRA) 134 MG capsule TAKE ONE CAPSULE BY MOUTH EVERY MORNING BEFORE BREAKFAST 90 capsule 1   gabapentin (NEURONTIN) 300 MG capsule Take 300 mg by mouth 3 (three) times daily.  losartan  (COZAAR ) 50 MG tablet TAKE 1 TABLET BY MOUTH DAILY 90 tablet 1   metoprolol  succinate (TOPROL -XL) 50 MG 24 hr tablet TAKE 1 TABLET BY MOUTH DAILY WITH A MEAL 90 tablet 1   Nebulizer MISC Use w/ nebulized medication q 6 hours 1 each 0   predniSONE  (DELTASONE ) 10 MG tablet 4 tabs for 3 days, then 3 tabs for 3 days, 2 tabs for 3 days, then 1 tab for 3 days, then stop 30 tablet 0   predniSONE  (DELTASONE ) 10 MG tablet Take 4 tablets (40 mg total) by mouth daily with breakfast for 2 days, THEN 3 tablets (30 mg total) daily with breakfast for 2 days, THEN 2 tablets (20 mg total) daily with breakfast for 2 days, THEN 1 tablet (10 mg total) daily with breakfast for 2 days. 20 tablet 0   Respiratory Therapy Supplies (NEBULIZER MASK ADULT/TUBING) MISC 1 each by Does not apply route every 6 (six) hours. Use w/ nebulized med q 6 hours 1 each 0   Spacer/Aero-Holding Chambers (OPTICHAMBER DIAMOND -LG MASK) DEVI Dispense one 1 each 0   No current facility-administered medications for this visit.    Allergies  Allergen Reactions   Demerol Nausea And Vomiting   Singulair  [Montelukast ]     Dry mouth/dehydration   Augmentin  [Amoxicillin -Pot Clavulanate] Diarrhea    Caused Diarrhea    Diagnoses:  Adjustment disorder with depressed mood  Clinical Assessment: Mrs. Sumpter is a 73 yo female who was referred for therapy by PCP due to  reported family stressors.  Mrs. Butrum was tearful at the beginning but reported she felt better being able to talk about her concerns.  Mrs. Decamp experiencing adjustment to changes in her relationship with her husband in the last year.    Mrs. Suniga denied any self-injurious behaviors and reported being clumsy. She did not report any domestic violence.  She was able to verbalize her thoughts & feelings about the changes in their relationship.  Mrs. Lonsway did not think she needed to return to therapy at this time but reported she will call to schedule if needed.  Plan of Care: Mrs. Picazo reported she will call to schedule if she decides she wants to return to therapy.  Mrs. Pierron was given contact information to schedule another appointment when she decides she would like to have another one.   Durwin Davisson SHAUNNA Pouch, LCSW

## 2023-10-01 ENCOUNTER — Other Ambulatory Visit: Payer: Self-pay | Admitting: Physician Assistant

## 2023-10-01 DIAGNOSIS — D869 Sarcoidosis, unspecified: Secondary | ICD-10-CM

## 2023-10-03 ENCOUNTER — Ambulatory Visit: Payer: Self-pay | Admitting: Nurse Practitioner

## 2023-10-03 NOTE — Telephone Encounter (Signed)
 FYI Only or Action Required?: Action required by provider: refusing hospital, needs assistance asap, requesting call back and meds.  Patient is followed in Pulmonology for asthma, last seen on 08/23/2023 by Malachy Comer GAILS, NP.  Called Nurse Triage reporting Shortness of Breath, Anxiety, tearful, choking on phlegm, struggling to breathe intermittently, chest congestion, Cough, and chest heaviness.  Symptoms began pt would not specify.  Interventions attempted: Nebulizer treatments and Other: having her husband stay with her so not alone while struggling to breathe again.  Symptoms are: rapidly worsening.  Triage Disposition: Call EMS 911 Now  Patient/caregiver understands and will follow disposition?: No, refuses disposition      Copied from CRM #[id number]. Topic: Clinical - Red Word Triage >> Oct 03, 2023  8:18 AM Benton KIDD wrote: [facility] that prompted transfer to Nurse Triage: patient dr suffering unable to breath on the nebulizer all day today is really bad i think i need more than a nebulizer >> Oct 03, 2023  8:44 AM Benton O wrote: Patient is calling back cause phones disconnected. Nurse Mitzi Lilja was suppose to call patient back . Getting her transferred back to triage .   Reason for Disposition  Sounds like a life-threatening emergency to the triager  Answer Assessment - Initial Assessment Questions Pt called back in after this nurse LVM on pt phone.  [facility] Triage - Initial Assessment Questions Chief Complaint (e.g., cough, sob, wheezing, fever, chills, sweat or additional symptoms) *Go to specific symptom protocol after initial questions. More SOB than usual Chronic condition, pollen in air worse Rely on nebulizer to breathe More and more often this morning, several times trying to breathe, trying to use Whole throat closes up, so much phlegm, When by self and trying to hook up, can't breathe, kind of scary Can't you prescribe something Tearful Was  overwhelmed, couldn't breathe at all, choking on phlegm Anxious, having a hard time thinking clearly Got on rescue nebulizer, some improvement, on every 3 hours 24/7 Need something  But we know that if get this way have things can do Refusing ED, [place] fire [place], don't worry Coughing productive This morning just overwhelmed, was by self, not letting him leave again Heaviness in chest and throat like choking on phlegm then cough up massive amounts green phlegm not a good sign Heaviness in chest very congested with phlegm Not answering if lasting longer than 5 min, just wants call back from pulm team, not answering if chest pain, numbness/tingling, weakness, dizziness No chest pain, dizziness There's a system in place, refusing ED Pt just wanting pulm team to call something in, call her back, need new med, don't know what med  6. CARDIAC HISTORY: Do you have any history of heart disease? (e.g., heart attack, angina, bypass surgery, angioplasty)      HTN 7. LUNG HISTORY: Do you have any history of lung disease?  (e.g., pulmonary embolus, asthma, emphysema)     Asthma   Advised pt go to hospital right away, pt refusing, advised pt call 911 if feel struggling to breathe again or chest heaviness lasting longer than 5 min, advised pt have someone with her to ensure she is able to use nebulizer while refusing hospital.  Protocols used: Breathing Difficulty-A-AH

## 2023-10-03 NOTE — Telephone Encounter (Signed)
 She needs to go to the ED or if there is someone who can see her today in person, she can come in for an acute visit. She needs CXR and someone needs to be able to evaluate her. Thanks.

## 2023-10-03 NOTE — Telephone Encounter (Signed)
 Pt disconnected before transfer to nurse triage. First attempt to contact pt, no ringing, straight to VM, LVM for call back to [place] office. Placed in call back.  Copied from CRM #[id number]. Topic: Clinical - Red Word Triage >> Oct 03, 2023  8:18 AM Benton KIDD wrote: [facility] that prompted transfer to Nurse Triage: patient dr suffering unable to breath on the nebulizer all day today is really bad i think i need more than a nebulizer

## 2023-10-03 NOTE — Telephone Encounter (Signed)
 ATC x1 LVM for patient to call our office back regarding prior message . Tammy does have some opening's today at our [place] location if patient does call back .

## 2023-10-04 NOTE — Telephone Encounter (Signed)
 Copied from CRM [id number]. Topic: General - Other >> Oct 04, 2023  3:09 PM Russell PARAS wrote: Reason for CRM:   Pt returning call from [place]. She was confused as to why she was being contacted. Explained to pt that due to speaking NT on 8/25, Consuelo had reached out to Dr. Malachy for advise on how to move forward, of note pt was refusing to go to ER. Advised Consuelo was reaching out in an attempt schedule her with another provider for an acute visit.   Pt wanted to speak to her husband, who she reports does not allow her to drive. She will contact clinic back after speaking with him.  CB# [phone]  Acknowledged. Will await call back

## 2023-10-13 ENCOUNTER — Ambulatory Visit: Admitting: Adult Health

## 2023-10-13 ENCOUNTER — Ambulatory Visit: Payer: Self-pay | Admitting: Nurse Practitioner

## 2023-10-13 NOTE — Telephone Encounter (Signed)
 FYI Only or Action Required?: FYI only for provider.  Patient is followed in Pulmonology for asthmatic bronchitis, last seen on 08/23/2023 by Malachy Comer GAILS, NP.  Called Nurse Triage reporting Cough.  Symptoms began several weeks ago.  Interventions attempted: Nebulizer treatments.  Symptoms are: gradually worsening.  Triage Disposition: See HCP Within 4 Hours (Or PCP Triage)  Patient/caregiver understands and will follow disposition?: Yes        Copied from CRM [id number]. Topic: Clinical - Red Word Triage >> Oct 13, 2023 11:53 AM Russell PARAS wrote: Red Word that prompted transfer to Nurse Triage:   Pt said has been having symptoms for several weeks Using nebulizer every night Deep and productive cough Phlegm is green  Pt of DR. Cobb/Ellison        Reason for Disposition  [1] MILD difficulty breathing (e.g., minimal/no SOB at rest, SOB with walking, pulse < 100) AND [2] still present when not coughing  Answer Assessment - Initial Assessment Questions 1. ONSET: When did the cough begin?      Several weeks  2. SEVERITY: How bad is the cough today?      Moderate  3. SPUTUM: Describe the color of your sputum (e.g., none, dry cough; clear, white, yellow, green)     Thick green  4. HEMOPTYSIS: Are you coughing up any blood? If Yes, ask: How much? (e.g., flecks, streaks, tablespoons, etc.)     No 5. DIFFICULTY BREATHING: Are you having difficulty breathing? If Yes, ask: How bad is it? (e.g., mild, moderate, severe)      Mild, using nebulizer nightly  6. FEVER: Do you have a fever? If Yes, ask: What is your temperature, how was it measured, and when did it start?     No 7. CARDIAC HISTORY: Do you have any history of heart disease? (e.g., heart attack, congestive heart failure)      Hypertension  8. LUNG HISTORY: Do you have any history of lung disease?  (e.g., pulmonary embolus, asthma, emphysema)     Asthmatic bronchitis  9. PE RISK FACTORS: Do  you have a history of blood clots? (or: recent major surgery, recent prolonged travel, bedridden)     No 10. OTHER SYMPTOMS: Do you have any other symptoms? (e.g., runny nose, wheezing, chest pain)       No  Protocols used: Cough - Acute Productive-A-AH

## 2023-10-14 ENCOUNTER — Ambulatory Visit: Payer: Self-pay | Admitting: Primary Care

## 2023-10-14 ENCOUNTER — Ambulatory Visit: Admitting: Primary Care

## 2023-10-14 ENCOUNTER — Ambulatory Visit (INDEPENDENT_AMBULATORY_CARE_PROVIDER_SITE_OTHER)

## 2023-10-14 ENCOUNTER — Encounter: Payer: Self-pay | Admitting: Primary Care

## 2023-10-14 VITALS — BP 122/62 | HR 67 | Temp 97.5°F | Ht 64.0 in | Wt 121.0 lb

## 2023-10-14 DIAGNOSIS — J4541 Moderate persistent asthma with (acute) exacerbation: Secondary | ICD-10-CM

## 2023-10-14 DIAGNOSIS — R053 Chronic cough: Secondary | ICD-10-CM

## 2023-10-14 DIAGNOSIS — J209 Acute bronchitis, unspecified: Secondary | ICD-10-CM

## 2023-10-14 MED ORDER — ALBUTEROL SULFATE (2.5 MG/3ML) 0.083% IN NEBU
2.5000 mg | INHALATION_SOLUTION | Freq: Once | RESPIRATORY_TRACT | Status: AC
  Administered 2023-10-14: 2.5 mg via RESPIRATORY_TRACT

## 2023-10-14 MED ORDER — DOXYCYCLINE HYCLATE 100 MG PO TABS: 100.0000 mg | ORAL_TABLET | Freq: Two times a day (BID) | ORAL | 0 refills | Status: DC

## 2023-10-14 MED ORDER — METHYLPREDNISOLONE ACETATE 80 MG/ML IJ SUSP
80.0000 mg | Freq: Once | INTRAMUSCULAR | Status: AC
  Administered 2023-10-14: 80 mg via INTRAMUSCULAR

## 2023-10-14 MED ORDER — PREDNISONE 10 MG PO TABS: ORAL_TABLET | ORAL | 0 refills | Status: AC

## 2023-10-14 NOTE — Progress Notes (Signed)
 @Patient  ID: Casey Rowe, female    DOB: [birth year], 73 y.o.   MRN: [id number]  Chief Complaint  Patient presents with   Cough    Wet- green phlegm, x10 days. Denies fever and chest pain. Pt states she has to give herself a nebulizer treatment during the middle of the night and the cough will make it hard to breathe.    Shortness of Breath    Pt states the s.o.b can become moderate to severe. X2 Weeks. S.O.B while resting     Referring provider: Domenica Harlene LABOR, MD  HPI: 73 year old female, never smoked. PMH significant for HTN, IBD, mild cognitive impairment, sarcoidosis, weight loss, GAD.   10/14/2023- interim hx  Discussed the use of AI scribe software for clinical note transcription with the patient, who gave verbal consent to proceed.  History of Present Illness Casey Rowe is a 73 year old female who presents with a productive cough and difficulty breathing.  She has been experiencing a productive cough for at least ten days, producing thick green mucus. She describes episodes of shortness of breath, necessitating frequent use of her nebulizer, particularly at night. Albuterol  in the nebulizer provides relief, although it is inconvenient to use during the night.  She has a Symbicort  160-4.5 mcg inhaler but does not use it as regularly as prescribed. She carries it with her and takes it daily, but not consistently as directed. She also uses over-the-counter Robitussin when her cough worsens.  She attributes her symptoms to allergies, which she experiences around this time of year. She uses Sudafed for her allergies and occasionally a nasal spray when experiencing severe breathing difficulties. She reports chest pressure and moments of feeling unable to breathe, and describes the sensation as being in her chest rather than her nose.  No recent exposure to anyone sick and no fever. She is concerned about having coughing attacks in public.    Allergies  Allergen Reactions    Demerol Nausea And Vomiting   Singulair  [Montelukast ]     Dry mouth/dehydration   Augmentin  [Amoxicillin -Pot Clavulanate] Diarrhea    Caused Diarrhea    Immunization History  Administered Date(s) Administered   DTaP 08/09/2010   Fluad  Quad(high Dose 65+) 87/90/7978, 11/14/2020, 01/13/2022   Fluad  Trivalent(High Dose 65+) 11/02/2022   INFLUENZA, HIGH DOSE SEASONAL PF 04/22/2018, 11/04/2018   Influenza Split 02/22/2011   Influenza,inj,Quad PF,6+ Mos 01/30/2014, 03/11/2015, 03/16/2016   PFIZER Comirnaty (Gray Top)Covid-19 Tri-Sucrose Vaccine 05/20/2020   PFIZER(Purple Top)SARS-COV-2 Vaccination 03/15/2019, 04/05/2019, 11/15/2019, 12/05/2019, 10/21/2020   PNEUMOCOCCAL CONJUGATE-20 03/29/2022   Pfizer Covid-19 Vaccine Bivalent Booster 67yrs & up 08/19/2021   Pfizer(Comirnaty )Fall Seasonal Vaccine 12 years and older 11/27/2021, 11/02/2022   Pneumococcal Polysaccharide-23 11/04/2018   Tdap 11/04/2018, 08/14/2020, 08/14/2020   Zoster Recombinant(Shingrix ) 08/14/2020, 11/14/2020   Zoster, Live 03/11/2015    Past Medical History:  Diagnosis Date   Abdominal pain 08/08/2020   Acute nonsuppurative otitis media of left ear 12/22/2014   Allergy    Anorexia 01/14/2021   600 calories/daily   Asthma exacerbation 02/22/2012   Broken jaw    1980s; fall down flight of stairs   Callus of foot 12/28/2017   Coccydynia 04/08/2020   Concussion 06/2022   Fall at home    Conjunctivitis 04/04/2022   Fall 03/27/2017   At work on November 11, 2016, following with workman's comp  Last Assessment & Plan:   Clemens at work on 11/09/16 and has been following with [employer]. She   fell  up some steps onto her knees and has been left with chronic pain.     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 360 mL 1   albuterol  (VENTOLIN  HFA) 108 (90 Base) MCG/ACT inhaler Inhale 1-2 puffs into the lungs every 4 (four) hours as needed for wheezing or shortness of breath. 18 g 5   azithromycin  (ZITHROMAX ) 250 MG tablet Take 2 tablets on day one then take 1 tablet daily for four additional days 6 tablet 0   budesonide -formoterol  (SYMBICORT ) 160-4.5 MCG/ACT inhaler Inhale 2 puffs into the lungs in the morning and at bedtime. 1 each 6   buPROPion  (WELLBUTRIN  XL) 150 MG 24 hr tablet Take a 300 mg tab and a 150 mg tab daily for a total daily dose of 450 mg 30 tablet 5   buPROPion  (WELLBUTRIN  XL) 300 MG 24 hr tablet Take a 300 mg tab and a 150 mg tab daily for a total daily dose of 450 mg     busPIRone  (BUSPAR ) 15 MG tablet Take 1 tablet (15 mg total) by mouth 3 (three) times daily. 270 tablet 1   fenofibrate  micronized (LOFIBRA) 134 MG capsule TAKE ONE CAPSULE BY MOUTH EVERY MORNING BEFORE BREAKFAST 90 capsule 1   gabapentin (NEURONTIN) 300 MG capsule Take 300 mg by mouth 3 (three) times daily.      losartan  (COZAAR ) 50 MG tablet TAKE 1 TABLET BY MOUTH DAILY 90 tablet 1   metoprolol  succinate (TOPROL -XL) 50 MG 24 hr tablet TAKE 1 TABLET BY MOUTH DAILY WITH A MEAL 90 tablet 1   Nebulizer MISC Use w/ nebulized medication q 6 hours 1 each 0   predniSONE  (DELTASONE ) 10 MG tablet 4 tabs for 3 days, then 3 tabs for 3 days, 2 tabs for 3 days, then 1 tab for 3 days, then stop 30 tablet 0   progesterone  (PROMETRIUM ) 100 MG capsule Take 100 mg by mouth at bedtime.     Respiratory Therapy Supplies (NEBULIZER MASK ADULT/TUBING) MISC 1 each by Does not apply route every 6 (six) hours. Use w/ nebulized med q 6 hours 1 each 0   Spacer/Aero-Holding Chambers (OPTICHAMBER DIAMOND -LG MASK) DEVI Dispense one 1 each 0   No facility-administered medications prior to visit.    Review of Systems  Review of Systems  Respiratory:  Positive for cough, shortness of breath and wheezing.  Physical Exam  BP 122/62   Pulse 67   Temp (!) 97.5 F (36.4 C)   Ht 5' 4 (1.626 m)   Wt 121 lb (54.9 kg)   LMP 02/08/1997   SpO2 96% Comment: ra  BMI 20.77 kg/m  Physical Exam Constitutional:      Appearance: She is well-developed.  HENT:     Head: Normocephalic and atraumatic.  Pulmonary:     Breath sounds: Examination of the right-upper field reveals wheezing. Examination of the left-upper field reveals wheezing. Decreased breath sounds and wheezing present.  Neurological:     Mental Status: She is alert.     Lab Results:  CBC    Component Value Date/Time   WBC 6.5 08/11/2023 1221   RBC 3.68 (L) 08/11/2023 1221   HGB 11.7 (L) 08/11/2023 1221   HCT 36.3 08/11/2023 1221   PLT 249.0 08/11/2023 1221   MCV 98.7 08/11/2023 1221   MCH 32.1 05/18/2023 1413   MCHC 32.1 08/11/2023 1221   RDW 13.7 08/11/2023 1221   LYMPHSABS 0.5 (L) 08/11/2023 1221   MONOABS 0.4 08/11/2023 1221   EOSABS 0.1 08/11/2023 1221   BASOSABS 0.0 08/11/2023 1221    BMET    Component Value Date/Time   NA 143 08/11/2023 1221    K 4.5 08/11/2023 1221   CL 106 08/11/2023 1221   CO2 32 08/11/2023 1221   GLUCOSE 92 08/11/2023 1221   BUN 15 08/11/2023 1221   CREATININE 0.84 08/11/2023 1221   CREATININE 0.72 02/11/2014 0900   CALCIUM 10.3 08/11/2023 1221   GFRNONAA 57 (L) 05/18/2023 1413   GFRNONAA >89 02/11/2014 0900   GFRAA >89 02/11/2014 0900    BNP    Component Value Date/Time   BNP 423.3 (H) 03/26/2021 2202    ProBNP No results found for: PROBNP  Imaging: No results found.   Assessment & Plan:   1. Moderate persistent asthma with exacerbation (Primary) - DG Chest 2 View; Future  2. Acute bronchitis, unspecified organism - albuterol  (PROVENTIL ) (2.5 MG/3ML) 0.083% nebulizer solution 2.5 mg - methylPREDNISolone  acetate (DEPO-MEDROL ) injection 80 mg  Assessment and Plan Assessment & Plan Acute bronchitis with asthma Acute productive cough lasting at least ten days with thick green sputum and wheezing, suggesting possible bronchospasm. Absence of fever reduces likelihood of active infection. Symptoms attributed to allergies by the patient. - Administer nebulizer treatment with albuterol  x1  - Administer steroid injection, depo-medrol  80mg  IM x1 - Order chest x-ray - Rx prednisone  taper and doxycyline 100mg  BID x 7 days   Asthma Asthma exacerbation with increased use of nebulizer and albuterol . Suboptimal use of Symbicort , prescribed as two puffs in the morning and evening. Symbicort , containing a steroid and long-acting bronchodilator, is crucial for long-term airway management. Emphasized the importance of adherence to Symbicort  regimen to maintain airway patency and reduce exacerbations. - Instruct to use Symbicort  as prescribed: two puffs in the morning and two puffs in the evening  Allergic rhinitis Symptoms include nasal congestion and chest pressure. Current use of Sudafed is suboptimal for allergy management. Occasional use of nasal spray reported. Recommended more effective allergy  medications for better symptom control. - Recommend switching to Zyrtec or Allegra for allergy management - Advise regular use of nasal spray for symptom control    Casey LELON Ferrari, NP 10/14/2023

## 2023-10-14 NOTE — Progress Notes (Signed)
 CXR reassuring, no evidence of pneumonia.  Take prednisone  taper and abx as directed for bronchitis symptoms

## 2023-10-14 NOTE — Patient Instructions (Addendum)
  VISIT SUMMARY: Today, you were seen for a productive cough and difficulty breathing. You have been experiencing these symptoms for at least ten days, with thick green mucus and shortness of breath, especially at night. You have been using your nebulizer and over-the-counter medications for relief. We discussed your current treatment plan and made some adjustments to better manage your symptoms.  YOUR PLAN: -ACUTE PRODUCTIVE COUGH WITH WHEEZING: You have a cough that produces thick green mucus and wheezing, which may be due to bronchospasm. We administered a nebulizer treatment with albuterol  and a steroid injection to help reduce inflammation and open your airways. A chest x-ray was ordered to further investigate your symptoms. Please use your Symbicort  inhaler after your nebulizer treatment as instructed.  -ASTHMA: Your asthma symptoms have worsened, leading to increased use of your nebulizer and albuterol . It is important to use your Symbicort  inhaler as prescribed, which is two puffs in the morning and two puffs in the evening. Symbicort  helps keep your airways open and reduces asthma attacks.  -ALLERGIC RHINITIS: You have symptoms of nasal congestion and chest pressure due to allergies. We recommend switching to Zyrtec or Allegra for better allergy management and using a nasal spray regularly to control your symptoms.  INSTRUCTIONS: Please follow up with a chest x-ray as ordered. Use your Symbicort  inhaler as prescribed: two puffs in the morning and two puffs in the evening. Switch to Zyrtec or Allegra for your allergies and use a nasal spray regularly. If your symptoms do not improve or worsen, please schedule a follow-up appointment.  Orders: Albuterol  nebulizer x1 in office  Depo-medrol  80mg  IM x 1 CXR today  Rx: Prednisone  taper- start tomorrow Doxycycline  100mg  twice daily x 7 days   Follow-up: If symptoms do not improve in 5-7 days call back

## 2023-10-20 ENCOUNTER — Other Ambulatory Visit (HOSPITAL_BASED_OUTPATIENT_CLINIC_OR_DEPARTMENT_OTHER): Payer: Self-pay

## 2023-10-27 ENCOUNTER — Other Ambulatory Visit (HOSPITAL_BASED_OUTPATIENT_CLINIC_OR_DEPARTMENT_OTHER): Payer: Self-pay

## 2023-10-27 MED ORDER — COMIRNATY 30 MCG/0.3ML IM SUSY
0.3000 mL | PREFILLED_SYRINGE | Freq: Once | INTRAMUSCULAR | 0 refills | Status: AC
  Filled 2023-10-27: qty 0.3, 1d supply, fill #0

## 2023-10-27 MED ORDER — FLUZONE HIGH-DOSE 0.5 ML IM SUSY
0.5000 mL | PREFILLED_SYRINGE | Freq: Once | INTRAMUSCULAR | 0 refills | Status: AC
  Filled 2023-10-27: qty 0.5, 1d supply, fill #0

## 2023-10-31 ENCOUNTER — Other Ambulatory Visit: Payer: Self-pay

## 2023-10-31 DIAGNOSIS — F418 Other specified anxiety disorders: Secondary | ICD-10-CM

## 2023-10-31 MED ORDER — BUSPIRONE HCL 15 MG PO TABS: 15.0000 mg | ORAL_TABLET | Freq: Three times a day (TID) | ORAL | 1 refills | Status: AC

## 2023-11-15 ENCOUNTER — Ambulatory Visit: Payer: Self-pay

## 2023-11-15 NOTE — Telephone Encounter (Signed)
 FYI Only or Action Required?: FYI only for provider.  Patient is followed in Pulmonology for asthma, last seen on 10/14/2023 by Hope Almarie ORN, NP.  Called Nurse Triage reporting Shortness of Breath.  Symptoms began several days ago.  Interventions attempted: Prescription medications: albuterol .  Symptoms are: gradually worsening.  Triage Disposition: See HCP Within 4 Hours (Or PCP Triage)  Patient/caregiver understands and will follow disposition?: Unsure  Copied from CRM #[id number]. Topic: Clinical - Red Word Triage >> Nov 15, 2023  3:46 PM Casey Rowe wrote: Red Word that prompted transfer to Nurse Triage: Pt stated her lungs hurt, she is struggling to breath clearly, and often uses her nebulizer multiple times a day.   Pt stated she is having chest heaviness, and she usually gets medication such as steroids to assist when this happens to her.  Pt has seen multiple providers including NP Comer Rouleau, Dr. Meade at [facility], and NP Madelin Stank at the [facility] location. Reason for Disposition  [1] Longstanding difficulty breathing AND [2] not responding to usual therapy  Answer Assessment - Initial Assessment Questions SOB, using neb more often than note. States  her lungs are aching. Has to do two breathing treatments to make herself feel better. RN recommended ED/UC for triage sxs. Pt states she will discuss with her husband and decide if she will go or not. RN educated on importance of prompt evaluation of sxs and treatment. Pt states understanding. Also scheduled acute visit with pulm for Thursday oct 9 at pt request.   1. RESPIRATORY STATUS: Describe your breathing? (e.g., wheezing, shortness of breath, unable to speak, severe coughing)      SOB with exertion 2. ONSET: When did this breathing problem begin?      Yesterday afternoon 3. PATTERN Does the difficult breathing come and go, or has it been constant since it started?      All the time until after neb  treatments  4. SEVERITY: How bad is your breathing? (e.g., mild, moderate, severe)      moderate 5. RECURRENT SYMPTOM: Have you had difficulty breathing before? If Yes, ask: When was the last time? and What happened that time?      Yes; asthma 6. CARDIAC HISTORY: Do you have any history of heart disease? (e.g., heart attack, angina, bypass surgery, angioplasty)      denies 7. LUNG HISTORY: Do you have any history of lung disease?  (e.g., pulmonary embolus, asthma, emphysema)     asthma 8. CAUSE: What do you think is causing the breathing problem?      Asthma and allergies  9. OTHER SYMPTOMS: Do you have any other symptoms? (e.g., chest pain, cough, dizziness, fever, runny nose)     denies 10. O2 SATURATION MONITOR:  Do you use an oxygen saturation monitor (pulse oximeter) at home? If Yes, ask: What is your reading (oxygen level) today? What is your usual oxygen saturation reading? (e.g., 95%)       denies  12. TRAVEL: Have you traveled out of the country in the last month? (e.g., travel history, exposures)       denies  Protocols used: Breathing Difficulty-A-AH

## 2023-11-16 NOTE — Telephone Encounter (Signed)
 Schedule OV with Katie 10/9.  Will discuss with spouse whether she will go to ED/UC today.  Nothing furthr needed.

## 2023-11-17 ENCOUNTER — Ambulatory Visit: Admitting: Nurse Practitioner

## 2023-11-17 ENCOUNTER — Encounter: Payer: Self-pay | Admitting: Nurse Practitioner

## 2023-11-17 VITALS — BP 122/62 | HR 74 | Temp 97.7°F | Ht 64.0 in | Wt 117.0 lb

## 2023-11-17 DIAGNOSIS — J040 Acute laryngitis: Secondary | ICD-10-CM

## 2023-11-17 DIAGNOSIS — J4540 Moderate persistent asthma, uncomplicated: Secondary | ICD-10-CM | POA: Diagnosis not present

## 2023-11-17 DIAGNOSIS — R058 Other specified cough: Secondary | ICD-10-CM

## 2023-11-17 DIAGNOSIS — D869 Sarcoidosis, unspecified: Secondary | ICD-10-CM

## 2023-11-17 DIAGNOSIS — J309 Allergic rhinitis, unspecified: Secondary | ICD-10-CM | POA: Diagnosis not present

## 2023-11-17 DIAGNOSIS — J45998 Other asthma: Secondary | ICD-10-CM | POA: Diagnosis not present

## 2023-11-17 DIAGNOSIS — J302 Other seasonal allergic rhinitis: Secondary | ICD-10-CM

## 2023-11-17 MED ORDER — FLUTICASONE PROPIONATE 50 MCG/ACT NA SUSP: 2.0000 | Freq: Every day | NASAL | 2 refills | Status: AC

## 2023-11-17 MED ORDER — PREDNISONE 20 MG PO TABS: 40.0000 mg | ORAL_TABLET | Freq: Every day | ORAL | 0 refills | Status: AC

## 2023-11-17 NOTE — Assessment & Plan Note (Signed)
 Lung exam reassuring without bronchospasm. Slowly improving. Symptoms appear to be upper airway in nature. Will continue her asthma regimen. She has had two exacerbations in the last few months. May want to consider biologic therapy pending peripheral eosinophil count and allergen panel. Action plan in place. Re-educated on albuterol  nebulizer prescription and risks of overuse. ED precautions reviewed.  Patient Instructions  Continue Albuterol  inhaler 2 puffs or 3 mL neb every 6 hours as needed for shortness of breath or wheezing. Notify if symptoms persist despite rescue inhaler/neb use. Do not use your breathing treatments more than 6 times a day and do not put more than one vial in at a time. If your breathing treatments are not helping, you need to go to the emergency department or the urgent care  Continue Symbicort  to 160 mcg dose - 2 puffs Twice daily. Brush tongue and rinse mouth afterwards. Use with spacer. Restart azelastine  nasal spray Use daily over the counter allergy pill such as claritin, zyrtec, xyzal   Flonase  nasal spray 2 sprays each nostril daily Prednisone  40 mg daily for 5 days. Take in AM with food.   Upper airway cough syndrome: Suppress your cough to allow your larynx (voice box) to heal.  Limit talking for the next few days. Avoid throat clearing. Work on cough suppression with the above recommended suppressants.  Use sugar free hard candies or non-menthol cough drops during this time to soothe your throat.  Warm tea with honey and lemon.   Lab work today    Follow up in 6 weeks with Dr. Kassie. If symptoms do not improve or worsen, please contact office for sooner follow up or seek emergency care.

## 2023-11-17 NOTE — Assessment & Plan Note (Signed)
 Dx in 1980's on lung biopsy. Quiescent since 9s. No evidence of active disease on recent imaging.

## 2023-11-17 NOTE — Assessment & Plan Note (Signed)
 See above.

## 2023-11-17 NOTE — Assessment & Plan Note (Signed)
 Likely viral or related to inflammation from postnasal drainage r/t allergies. See above. Will tx her with prednisone  burst and supportive care measures. Add on intransal steroid. Encouraged to resume allergy regimen and rest voice.

## 2023-11-17 NOTE — Patient Instructions (Addendum)
 Continue Albuterol  inhaler 2 puffs or 3 mL neb every 6 hours as needed for shortness of breath or wheezing. Notify if symptoms persist despite rescue inhaler/neb use. Do not use your breathing treatments more than 6 times a day and do not put more than one vial in at a time. If your breathing treatments are not helping, you need to go to the emergency department or the urgent care  Continue Symbicort  to 160 mcg dose - 2 puffs Twice daily. Brush tongue and rinse mouth afterwards. Use with spacer. Restart azelastine  nasal spray Use daily over the counter allergy pill such as claritin, zyrtec, xyzal   Flonase  nasal spray 2 sprays each nostril daily Prednisone  40 mg daily for 5 days. Take in AM with food.   Upper airway cough syndrome: Suppress your cough to allow your larynx (voice box) to heal.  Limit talking for the next few days. Avoid throat clearing. Work on cough suppression with the above recommended suppressants.  Use sugar free hard candies or non-menthol cough drops during this time to soothe your throat.  Warm tea with honey and lemon.   Lab work today    Follow up in 6 weeks with Dr. Kassie. If symptoms do not improve or worsen, please contact office for sooner follow up or seek emergency care.

## 2023-11-17 NOTE — Progress Notes (Signed)
 @Patient  ID: Casey Rowe, female    DOB: [birth year], 73 y.o.   MRN: [id number]  Chief Complaint  Patient presents with   Medical Management of Chronic Issues    Chest congestion- x3 days. Chest pain (Rated a 6/10) denies fever. Green mucous.     Referring provider: Domenica Harlene LABOR, MD  HPI: 73 year female, never smoker followed for asthma and sarcoidosis. She is a patient of Casey Rowe and last seen in office 10/14/2023 by Casey Rowe. Past medical history significant for HTN, IBS, cognitive impairment, HLD, anxiety.   TEST/EVENTS:  08/19/2021 PFT: FVC 102, FEV1 105, ratio 75, TLC 117, DLCO 91 05/18/2023 CTA chest: no PE. No acute pulm findings. 08/03/2023 CXR: clear  10/14/2023 CXR: Clear   06/06/2023: OV with Casey Rowe. Saw Casey Rowe in February for chronic cough, felt to be post viral bronchitis worsened by underlying asthma. Prescribed prolonged course of steroids. CXR in April negative. Currently not been needing nebulizer. Using Advair diskus; only using 2-3 days a week. Having hoarseness. Reduced ICS load to improve compliance and change to HFA. 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   08/03/2023: Casey Rowe Discussed the use of AI scribe software for clinical note transcription with the patient, who gave verbal consent to proceed. Casey Rowe is a 73 year old female who presents for follow up.  She is experiencing respiratory issues, which she attributes to the weather and allergies. She has been using her Symbicort  inhaler and nebulizers frequently, stating 'I'm on nebulizers like, uh, a lot now.' Her breathing difficulties worsen during hay fever season and when the heat is on. She has not had a breathing treatment in a couple of days, although she was previously doing them twice a day. She is coughing up green phlegm. Has some wheezing. No hemoptysis, fevers, chills.  She mentions a fall last night in the parking lot. Now having difficulty thinking clearly and feels  more confused. When she fell, she hit her head, shoulder, and knees on the concrete. She has a lot of pain in her left knee as well. Left elbow is sore. She denies any headaches, vision changes, gait instability. No lethargy.     08/23/2023: Ov with Casey Rowe Patient presents today for follow up. She was treated for asthma exacebation at her last visit with prednisone  and z pack; although she does not seem to recall this. She does feel better. She's not used her nebulizer in the last few weeks she tells me, which would coincide with her treatment. She does seem to have some confusion regarding inhaler therapies. She uses Symbicort  twice daily but reports also using Advair Diskus. Unclear why both were filled; no active rx for Advair that I can see. D/c by Casey Rowe in April 2025. She is still using her albuterol  inhaler a few times a week. No significant cough now. Seems to have some recall difficulties. She's recovered from her fall. No fevers, chills, hemoptysis, chest congestion, PND, wheezing.  10/14/2023: Casey with Casey Rowe. Acute symptoms with productive cough for at least 10 days, green thick mucus. Episodes of SOB, necessitating frequent use of her nebulizer. Does provide relief. Not using symbicort  regularly. Attributes worsening symptoms to allergies. CXR clear. Tx with doxy and prednisone .  11/17/2023: Today - acute Discussed the use of AI scribe software for clinical note transcription with the patient, who gave verbal consent to proceed.  History of Present Illness Casey Rowe is a  73 year old female with asthma who presents with a flare-up of respiratory symptoms.  She has been experiencing a flare-up of respiratory symptoms for the past five to six days, initially characterized by coughing up green sputum, which has since improved to a dry cough. No hemoptysis or recent fevers. She has been using her nebulizer multiple times a day due to difficulty breathing and chest tightness. She has  been using two vials of her nebulizer medication, up to 7-8 times a day some days. Today, she seems to be improved. Only had to use her nebulizer this morning. Breathing does feel better. She is just very hoarse now.   She does have sinus congestion and drainage. Her throat feels sore/irritated. No sinus tenderness, headaches, ear pain. No recent illness in others around her. Not noticing any wheezing.   She says she is using her Symbicort  twice daily.   Had exacerbations in June and September requiring steroids/abx.  Not using any nasal sprays right now.   Unable to complete FeNO    Allergies  Allergen Reactions   Demerol Nausea And Vomiting   Singulair  [Montelukast ]     Dry mouth/dehydration   Augmentin  [Amoxicillin -Pot Clavulanate] Diarrhea    Caused Diarrhea    Immunization History  Administered Date(s) Administered   DTaP 08/09/2010   Fluad  Quad(high Dose 65+) 01/17/2020, 11/14/2020, 01/13/2022   Fluad  Trivalent(High Dose 65+) 11/02/2022   INFLUENZA, HIGH DOSE SEASONAL PF 04/22/2018, 11/04/2018, 10/27/2023   Influenza Split 02/22/2011   Influenza,inj,Quad PF,6+ Mos 01/30/2014, 03/11/2015, 03/16/2016   PFIZER Comirnaty (Gray Top)Covid-19 Tri-Sucrose Vaccine 05/20/2020   PFIZER(Purple Top)SARS-COV-2 Vaccination 03/15/2019, 04/05/2019, 11/15/2019, 12/05/2019, 10/21/2020   PNEUMOCOCCAL CONJUGATE-20 03/29/2022   Pfizer Covid-19 Vaccine Bivalent Booster 71yrs & up 08/19/2021   Pfizer(Comirnaty )Fall Seasonal Vaccine 12 years and older 11/27/2021, 11/02/2022, 10/27/2023   Pneumococcal Polysaccharide-23 11/04/2018   Tdap 11/04/2018, 08/14/2020, 08/14/2020   Zoster Recombinant(Shingrix ) 08/14/2020, 11/14/2020   Zoster, Live 03/11/2015    Past Medical History:  Diagnosis Date   Abdominal pain 08/08/2020   Acute nonsuppurative otitis media of left ear 12/22/2014   Allergy    Anorexia 01/14/2021   600 calories/daily   Asthma exacerbation 02/22/2012   Broken jaw    1980s;  fall down flight of stairs   Callus of foot 12/28/2017   Coccydynia 04/08/2020   Concussion 06/2022   Fall at home    Conjunctivitis 04/04/2022   Fall 03/27/2017   At work on November 11, 2016, following with workman's comp  Last Assessment & Plan:   Clemens at work on 11/09/16 and has been following with [employer]. She   fell up some steps onto her knees and has been left with chronic pain.     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 360 mL 1   albuterol  (VENTOLIN  HFA) 108 (90 Base) MCG/ACT inhaler Inhale 1-2 puffs into the lungs every 4 (four) hours as needed for wheezing or shortness of breath. 18 g 5   budesonide -formoterol  (SYMBICORT ) 160-4.5 MCG/ACT inhaler Inhale 2 puffs into the lungs in the morning and at bedtime. 1 each 6   buPROPion  (WELLBUTRIN  XL) 150 MG 24 hr tablet  Take a 300 mg tab and a 150 mg tab daily for a total daily dose of 450 mg 30 tablet 5   buPROPion  (WELLBUTRIN  XL) 300 MG 24 hr tablet Take a 300 mg tab and a 150 mg tab daily for a total daily dose of 450 mg     busPIRone  (BUSPAR ) 15 MG tablet Take 1 tablet (15 mg total) by mouth 3 (three) times daily. 270 tablet 1   fenofibrate  micronized (LOFIBRA) 134 MG capsule TAKE ONE CAPSULE BY MOUTH EVERY MORNING BEFORE BREAKFAST 90 capsule 1   gabapentin (NEURONTIN) 300 MG capsule Take 300 mg by mouth 3 (three) times daily.     losartan  (COZAAR ) 50 MG tablet TAKE 1 TABLET BY MOUTH DAILY 90 tablet 1   metoprolol  succinate (TOPROL -XL) 50 MG 24 hr tablet TAKE 1 TABLET BY MOUTH DAILY WITH A MEAL 90 tablet 1   Nebulizer MISC Use w/ nebulized medication q 6 hours 1 each 0   progesterone  (PROMETRIUM ) 100 MG capsule Take 100 mg by mouth at bedtime.     Respiratory Therapy Supplies (NEBULIZER MASK ADULT/TUBING) MISC 1 each by Does not apply route every 6 (six) hours. Use w/ nebulized med q 6 hours 1 each 0   Spacer/Aero-Holding Chambers (OPTICHAMBER DIAMOND -LG MASK) DEVI Dispense one 1 each 0   doxycycline  (VIBRA -TABS) 100 MG tablet Take 1 tablet (100 mg total) by mouth 2 (two) times daily. (Patient not taking: Reported on 11/17/2023) 14 tablet 0   No facility-administered medications prior to visit.     Review of Systems: as above    Physical Exam:  BP 122/62   Pulse 74   Temp 97.7 F (36.5 C)   Ht 5' 4 (1.626 m)   Wt 117 lb (53.1 kg)   LMP 02/08/1997   SpO2 95% Comment: ra  BMI 20.08 kg/m   GEN: Pleasant, interactive, well-kempt; in no acute distress HEENT:  Normocephalic and atraumatic. PERRLA. Sclera white. Nasal turbinates erythematous, moist and patent bilaterally. No rhinorrhea present. Oropharynx erythematous and moist, without exudate or edema. No lesions, ulcerations, or postnasal drip. Hoarse voice quality  NECK:  Supple w/ fair ROM. Cervical lymphadenopathy.   CV: RRR, no m/r/g, no  peripheral edema. Pulses intact, +2 bilaterally. No cyanosis, pallor or clubbing. PULMONARY:  Unlabored, regular breathing. Clear b/l A&P w/o wheezes/rales/rhonchi. No accessory muscle use.  GI: BS present and normoactive. Soft, non-tender to palpation.  MSK: No deformities, warmth or tenderness. Cap refil <2 sec all extrem. Neuro: A/Ox3. Short term memory recall impairment  Skin: Warm, no lesions or rashe Psych: Normal affect and behavior.     Lab Results:  CBC    Component Value Date/Time   WBC 6.5 08/11/2023 1221   RBC 3.68 (L)  08/11/2023 1221   HGB 11.7 (L) 08/11/2023 1221   HCT 36.3 08/11/2023 1221   PLT 249.0 08/11/2023 1221   MCV 98.7 08/11/2023 1221   MCH 32.1 05/18/2023 1413   MCHC 32.1 08/11/2023 1221   RDW 13.7 08/11/2023 1221   LYMPHSABS 0.5 (L) 08/11/2023 1221   MONOABS 0.4 08/11/2023 1221   EOSABS 0.1 08/11/2023 1221   BASOSABS 0.0 08/11/2023 1221    BMET    Component Value Date/Time   NA 143 08/11/2023 1221   K 4.5 08/11/2023 1221   CL 106 08/11/2023 1221   CO2 32 08/11/2023 1221   GLUCOSE 92 08/11/2023 1221   BUN 15 08/11/2023 1221   CREATININE 0.84 08/11/2023 1221   CREATININE 0.72 02/11/2014 0900   CALCIUM 10.3 08/11/2023 1221   GFRNONAA 57 (L) 05/18/2023 1413   GFRNONAA >89 02/11/2014 0900   GFRAA >89 02/11/2014 0900    BNP    Component Value Date/Time   BNP 423.3 (H) 03/26/2021 2202     Imaging:  No results found.   albuterol  (PROVENTIL ) (2.5 MG/3ML) 0.083% nebulizer solution 2.5 mg     Date Action Dose Route User   10/14/2023 [phone] Given 2.5 mg Nebulization Busick, Ashlyn T, CMA      methylPREDNISolone  acetate (DEPO-MEDROL ) injection 80 mg     Date Action Dose Route User   10/14/2023 1004 Given 80 mg Intramuscular (Right Ventrogluteal) Busick, Ashlyn T, CMA          Latest Ref Rng & Units 08/19/2021   12:47 PM  PFT Results  FVC-Pre L 3.13   FVC-Predicted Pre % 102   FVC-Post L 3.31   FVC-Predicted Post % 107   Pre  FEV1/FVC % % 79   Post FEV1/FCV % % 75   FEV1-Pre L 2.47   FEV1-Predicted Pre % 105   FEV1-Post L 2.48   DLCO uncorrected ml/min/mmHg 17.96   DLCO UNC% % 91   DLCO corrected ml/min/mmHg 17.85   DLCO COR %Predicted % 91   DLVA Predicted % 91   TLC L 5.94   TLC % Predicted % 117   RV % Predicted % 139     No results found for: NITRICOXIDE      Assessment & Plan:   Asthmatic bronchitis, moderate persistent, uncomplicated Lung exam reassuring without bronchospasm. Slowly improving. Symptoms appear to be upper airway in nature. Will continue her asthma regimen. She has had two exacerbations in the last few months. May want to consider biologic therapy pending peripheral eosinophil count and allergen panel. Action plan in place. Re-educated on albuterol  nebulizer prescription and risks of overuse. ED precautions reviewed.  Patient Instructions  Continue Albuterol  inhaler 2 puffs or 3 mL neb every 6 hours as needed for shortness of breath or wheezing. Notify if symptoms persist despite rescue inhaler/neb use. Do not use your breathing treatments more than 6 times a day and do not put more than one vial in at a time. If your breathing treatments are not helping, you need to go to the emergency department or the urgent care  Continue Symbicort  to 160 mcg dose - 2 puffs Twice daily. Brush tongue and rinse mouth afterwards. Use with spacer. Restart azelastine  nasal spray Use daily over the counter allergy pill such as claritin, zyrtec, xyzal   Flonase  nasal spray 2 sprays each nostril daily Prednisone  40 mg daily for 5 days. Take in AM with food.   Upper airway cough syndrome: Suppress your cough to allow your larynx (voice  box) to heal.  Limit talking for the next few days. Avoid throat clearing. Work on cough suppression with the above recommended suppressants.  Use sugar free hard candies or non-menthol cough drops during this time to soothe your throat.  Warm tea with honey and lemon.    Lab work today    Follow up in 6 weeks with Casey Rowe. If symptoms do not improve or worsen, please contact office for sooner follow up or seek emergency care.   Laryngitis Likely viral or related to inflammation from postnasal drainage r/t allergies. See above. Will tx her with prednisone  burst and supportive care measures. Add on intransal steroid. Encouraged to resume allergy regimen and rest voice.   Allergic rhinitis See above  Sarcoidosis Dx in 1980's on lung biopsy. Quiescent since 47s. No evidence of active disease on recent imaging.      Advised if symptoms do not improve or worsen, to please contact office for sooner follow up or seek emergency care.   I spent 35 minutes of dedicated to the care of this patient on the date of this encounter to include pre-visit review of records, face-to-face time with the patient discussing conditions above, post visit ordering of testing, clinical documentation with the electronic health record, making appropriate referrals as documented, and communicating necessary findings to members of the patients care team.  Comer LULLA Rouleau, Rowe 11/17/2023  Pt aware and understands Rowe's role.

## 2023-11-18 LAB — CBC WITH DIFFERENTIAL/PLATELET
Basophils Absolute: 0 K/uL (ref 0.0–0.1)
Basophils Relative: 1.1 % (ref 0.0–3.0)
Eosinophils Absolute: 0.1 K/uL (ref 0.0–0.7)
Eosinophils Relative: 2.9 % (ref 0.0–5.0)
HCT: 41.2 % (ref 36.0–46.0)
Hemoglobin: 13.2 g/dL (ref 12.0–15.0)
Lymphocytes Relative: 28.9 % (ref 12.0–46.0)
Lymphs Abs: 1.3 K/uL (ref 0.7–4.0)
MCHC: 32.1 g/dL (ref 30.0–36.0)
MCV: 99.6 fl (ref 78.0–100.0)
Monocytes Absolute: 0.5 K/uL (ref 0.1–1.0)
Monocytes Relative: 10.8 % (ref 3.0–12.0)
Neutro Abs: 2.5 K/uL (ref 1.4–7.7)
Neutrophils Relative %: 56.3 % (ref 43.0–77.0)
Platelets: 307 K/uL (ref 150.0–400.0)
RBC: 4.14 Mil/uL (ref 3.87–5.11)
RDW: 14.2 % (ref 11.5–15.5)
WBC: 4.4 K/uL (ref 4.0–10.5)

## 2023-11-21 LAB — ALLERGEN PANEL (27) + IGE
Alternaria Alternata IgE: <0.1 kU/L
Aspergillus Fumigatus IgE: <0.1 kU/L
Bahia Grass IgE: <0.1 kU/L
Bermuda Grass IgE: <0.1 kU/L
Cat Dander IgE: <0.1 kU/L
Cedar, Mountain IgE: <0.1 kU/L
Cladosporium Herbarum IgE: <0.1 kU/L
Cocklebur IgE: 0.22 kU/L — AB
Cockroach, American IgE: <0.1 kU/L
Common Silver Birch IgE: <0.1 kU/L
D Farinae IgE: <0.1 kU/L
D Pteronyssinus IgE: <0.1 kU/L
Dog Dander IgE: <0.1 kU/L
Elm, American IgE: <0.1 kU/L
Hickory, White IgE: <0.1 kU/L
IgE (Immunoglobulin E), Serum: 151 [IU]/mL (ref 6–495)
Johnson Grass IgE: <0.1 kU/L
Kentucky Bluegrass IgE: <0.1 kU/L
Maple/Box Elder IgE: <0.1 kU/L
Mucor Racemosus IgE: <0.1 kU/L
Oak, White IgE: <0.1 kU/L
Penicillium Chrysogen IgE: <0.1 kU/L
Pigweed, Rough IgE: <0.1 kU/L
Plantain, English IgE: <0.1 kU/L
Ragweed, Short IgE: 1.33 kU/L — AB
Setomelanomma Rostrat: <0.1 kU/L
Timothy Grass IgE: <0.1 kU/L
White Mulberry IgE: <0.1 kU/L

## 2023-11-23 ENCOUNTER — Ambulatory Visit: Payer: Self-pay | Admitting: Nurse Practitioner

## 2023-11-23 DIAGNOSIS — J3089 Other allergic rhinitis: Secondary | ICD-10-CM

## 2023-11-23 MED ORDER — LEVOCETIRIZINE DIHYDROCHLORIDE 5 MG PO TABS: 5.0000 mg | ORAL_TABLET | Freq: Every evening | ORAL | 5 refills | Status: AC

## 2023-11-23 NOTE — Progress Notes (Signed)
 ATC x1. LVMTCB

## 2023-11-23 NOTE — Progress Notes (Signed)
 Allergen panel positive to ragweed and cocklebur, likely contributing to recent exacerbations. She needs to ensure she is taking a daily non drowsy allergy pill and keep doing the flonase . I'm going to send in Xyzal for the allergy pill - 1 tablet daily. If she continues to have difficulties, we will get her in with allergy.   Her peripheral eosinophils are not significantly elevated; no indication for biologic therapy based on these. Thanks.

## 2023-11-24 NOTE — Progress Notes (Signed)
 ATCx2, LVMTCB. Sending MyChart message as pt has been unable to be reached. NFN

## 2023-11-30 ENCOUNTER — Other Ambulatory Visit: Payer: Self-pay | Admitting: Family Medicine

## 2023-12-22 DIAGNOSIS — G9009 Other idiopathic peripheral autonomic neuropathy: Secondary | ICD-10-CM | POA: Diagnosis not present

## 2023-12-22 DIAGNOSIS — M7742 Metatarsalgia, left foot: Secondary | ICD-10-CM | POA: Diagnosis not present

## 2023-12-22 DIAGNOSIS — M7741 Metatarsalgia, right foot: Secondary | ICD-10-CM | POA: Diagnosis not present

## 2023-12-22 DIAGNOSIS — M79672 Pain in left foot: Secondary | ICD-10-CM | POA: Diagnosis not present

## 2023-12-22 DIAGNOSIS — G5762 Lesion of plantar nerve, left lower limb: Secondary | ICD-10-CM | POA: Diagnosis not present

## 2023-12-22 DIAGNOSIS — G5761 Lesion of plantar nerve, right lower limb: Secondary | ICD-10-CM | POA: Diagnosis not present

## 2023-12-22 DIAGNOSIS — M79671 Pain in right foot: Secondary | ICD-10-CM | POA: Diagnosis not present

## 2023-12-30 ENCOUNTER — Telehealth: Payer: Self-pay | Admitting: Family Medicine

## 2023-12-30 NOTE — Telephone Encounter (Signed)
 Copied from CRM [id number]. Topic: Referral - Request for Referral >> Dec 30, 2023 11:27 AM Treva T wrote: Did the patient discuss referral with their provider in the last year? Yes   Appointment offered? Yes   Type of order/referral and detailed reason for visit: Pt states she was referred to see a counselor, she does not want to see a counselor she is requesting to see a Psychiatrist provider.  Preference of office, provider, location: [place] preferred, or [employer] as secondary  If referral order, have you been seen by this specialty before? No   Can we respond through MyChart? No, pt prefers a follow up call to [phone]

## 2023-12-30 NOTE — Telephone Encounter (Signed)
 Pt called and lvm to return call , ok for e2c2 to relay message  Pt can call pysh to reschedule appointment -- pt missed call from office on 08/11 & 08/18   [facility] [address] [place] [address] [phone]

## 2024-01-02 ENCOUNTER — Other Ambulatory Visit: Payer: Self-pay | Admitting: Family Medicine

## 2024-01-02 MED ORDER — BUPROPION HCL ER (XL) 150 MG PO TB24: ORAL_TABLET | ORAL | 0 refills | Status: AC

## 2024-01-02 MED ORDER — BUPROPION HCL ER (XL) 300 MG PO TB24: ORAL_TABLET | ORAL | 0 refills | Status: AC

## 2024-01-02 NOTE — Telephone Encounter (Signed)
 Copied from CRM #[id number]. Topic: Clinical - Medication Refill >> Jan 02, 2024  1:36 PM Eva FALCON wrote: Medication: buPROPion  (WELLBUTRIN  XL) 300 MG 24 hr tablet  Has the patient contacted their pharmacy? Yes, out of refills. (Agent: If no, request that the patient contact the pharmacy for the refill. If patient does not wish to contact the pharmacy document the reason why and proceed with request.) (Agent: If yes, when and what did the pharmacy advise?)  This is the patient's preferred pharmacy:  [facility] [id number] - [place], [place] - [address] [place] [address] Phone: [phone] Fax: [phone]  Is this the correct pharmacy for this prescription? Yes If no, delete pharmacy and type the correct one.   Has the prescription been filled recently? Yes  Is the patient out of the medication? Yes  Has the patient been seen for an appointment in the last year OR does the patient have an upcoming appointment? Yes  Can we respond through MyChart? Yes  Agent: Please be advised that Rx refills may take up to 3 business days. We ask that you follow-up with your pharmacy.

## 2024-01-09 ENCOUNTER — Encounter (HOSPITAL_BASED_OUTPATIENT_CLINIC_OR_DEPARTMENT_OTHER): Payer: Self-pay | Admitting: Pulmonary Disease

## 2024-01-09 ENCOUNTER — Ambulatory Visit (HOSPITAL_BASED_OUTPATIENT_CLINIC_OR_DEPARTMENT_OTHER): Admitting: Pulmonary Disease

## 2024-01-09 ENCOUNTER — Encounter (INDEPENDENT_AMBULATORY_CARE_PROVIDER_SITE_OTHER): Payer: Self-pay

## 2024-01-09 VITALS — BP 129/72 | HR 72 | Ht 64.0 in | Wt 123.8 lb

## 2024-01-09 DIAGNOSIS — R490 Dysphonia: Secondary | ICD-10-CM

## 2024-01-09 DIAGNOSIS — J4541 Moderate persistent asthma with (acute) exacerbation: Secondary | ICD-10-CM | POA: Diagnosis not present

## 2024-01-09 MED ORDER — ALBUTEROL SULFATE (2.5 MG/3ML) 0.083% IN NEBU: 2.5000 mg | INHALATION_SOLUTION | Freq: Four times a day (QID) | RESPIRATORY_TRACT | 1 refills | Status: AC | PRN

## 2024-01-09 MED ORDER — BUDESONIDE 0.25 MG/2ML IN SUSP: 0.2500 mg | Freq: Every day | RESPIRATORY_TRACT | 2 refills | Status: AC

## 2024-01-09 MED ORDER — OPTICHAMBER DIAMOND-LG MASK DEVI: 0 refills | Status: AC

## 2024-01-09 MED ORDER — PREDNISONE 10 MG PO TABS: ORAL_TABLET | ORAL | 0 refills | Status: AC

## 2024-01-09 NOTE — Progress Notes (Signed)
 Subjective:   PATIENT ID: Casey Rowe [place] GENDER: female DOB: 08-May-1950, MRN: [id number]   HPI  Chief Complaint  Patient presents with   Sarcoidosis    Reason for Visit: Follow-up  [name] is a 73 year old female never smoker with sarcoidosis, asthma, HTN, anxiety, morton's neuroma who for follow-up.  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 On PRN low-dose Advair for her asthma. She reports chronic cough that begin in October 2022. Has worsened in the last 2.5 months. She was diagnosed with COVID, Kraken variant, in Jan 2023. She had had persistent productive cough. Sputum production has improved Tried tessalon  perles. She was seen in [facility] ED for wheezing, cough, and shortness of breath for asthma exacerbation in Feb 2023.  Heat, laying on her back will worsen her cough. Robitussin improves her cough. Denies chest tightness. Works out 3-4 times a week on cardio machine. In the last year she reports 20 lb of unintentional weight loss. Thyroid  levels normal. Previously evaluated for diarrhea.  06/11/21 Since our last visit she was treated for asthma exacerbation with prednisone  and prescribed Advair. With the steroid, she reports productive coughing and wheezing resolved. Shortness of breath has improved. Has to use robitussin frequently. Weight has been stabilizing. She reports occasional episodes of diaphoresis a few weeks ago. State her diet could be better  08/19/21 Cough improves with daily Advair use. Does have some hoarseness. She rinses mouth out. Continues to have diarrhea. Weight is stable between 116-119 lbs. She eats two meals a day and her husband reminds her to eat. She has no appetite.  01/04/22 Recently fell on her knee and having difficulty ambulating. Her cough is fairly controlled on Advair 1-2 times daily. Has to use albuterol  2-3 times a day to stop coughing fits. The advair does cause  her to be hoarse but she feels it is effective. Denies exacerbations requiring steroids or antibiotics since last visit. In general she is very active, going to the gym 4-5 days a week so she is frustrated with her injury. She will see her PCP for evaluation for this. Denies shortness of breath or wheezing.  06/06/23 Last visit in 2023 with me. She recently seen with Dr. Alva on 03/17/23 for chronic cough suspected to be post-viral bronchitis worsened by underlying asthma on scheduled nebulizers. Was prescribed prolonged course of prednisone  for two weeks. Also seen in the ED on 05/18/23 for presumed bronchitis with negative CXR and negative CTA 05/18/23 for pulmonary embolism and treated Duoneb.  She currently has not been needing her nebulizer for the last 5 days. No longer having nocturnal symptoms. She is using Advair diskus but only using it 2-3 days a week and only once a day. Does report heat triggers her symptoms.   01/09/24 Since our last visit she has had multiple exacerbations over the summer. Has been compliant with Symbicort  and felt improved while on prednisone  tapers. However has lost her voice since August, four months ago. Unable to titrate down to lower dose Symbicort  due to uncontrolled asthma. She never picked up spacer. She reports her chest tightness is still an issue. She using albuterol  nebulizer at least twice a day which helps with the chest tightness. Cough has improved with no issues two weeks ago. Reports sinus drainage but is improved. Cough is nonproductive but is congested. Over the summer biologic was considered however no peripheral eosinophilia.  Social History: Never smoker Retired  Retail buyer. Currently subbing  Past Medical History:  Diagnosis Date   Abdominal pain 08/08/2020   Acute nonsuppurative otitis media of left ear 12/22/2014   Allergy    Anorexia 01/14/2021   600 calories/daily   Asthma exacerbation 02/22/2012   Broken jaw    1980s; fall down flight  of stairs   Callus of foot 12/28/2017   Coccydynia 04/08/2020   Concussion 06/2022   Fall at home    Conjunctivitis 04/04/2022   Fall 03/27/2017   At work on November 11, 2016, following with workman's comp  Last Assessment & Plan:   Clemens at work on 11/09/16 and has been following with Bj's comp. She   fell up some steps onto her knees and has been left with chronic pain.     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   Palpitation 03/24/2017   Rosacea 03/17/2020   Sarcoidosis    Sinusitis 01/18/2011   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   Vitamin B 12 deficiency 06/23/2022   Weight loss 11/07/2013     Family History  Problem Relation Age of Onset   Hypertension Mother    Dementia Mother        multi-infarct   Diabetes Mother    Diabetes Father    Heart attack Father    Heart disease Father    Psoriasis Father    Cancer Sister        pancreatic   Alcohol abuse Sister        substance   Hepatitis B Brother    Gout Brother    Hypertension Brother    Glaucoma Brother    Heart disease Brother    Rashes / Skin problems Son    Other Son        bad allergies, aspirin, dermatitis   Rashes / Skin problems Son    Cancer Maternal Aunt        breast   Breast cancer Maternal Aunt      Social History   Occupational History   Occupation: Retired  Tobacco Use   Smoking status: Never   Smokeless tobacco: Never  Vaping Use    Vaping status: Never Used  Substance and Sexual Activity   Alcohol use: Yes    Alcohol/week: 14.0 - 21.0 standard drinks of alcohol    Types: 14 - 21 Glasses of wine per week    Comment: couple glasses of wine a lot of nights   Drug use: No   Sexual activity: Yes    Allergies  Allergen Reactions   Demerol Nausea And Vomiting   Singulair  [Montelukast ]     Dry mouth/dehydration   Augmentin  [Amoxicillin -Pot Clavulanate] Diarrhea    Caused Diarrhea     Outpatient Medications Prior to Visit  Medication Sig Dispense Refill   albuterol  (VENTOLIN  HFA) 108 (90 Base) MCG/ACT inhaler Inhale 1-2 puffs into the lungs every 4 (four) hours as needed for wheezing or shortness of breath. 18 g 5  budesonide -formoterol  (SYMBICORT ) 160-4.5 MCG/ACT inhaler Inhale 2 puffs into the lungs in the morning and at bedtime. 1 each 6   buPROPion  (WELLBUTRIN  XL) 150 MG 24 hr tablet Take a 300 mg tab and a 150 mg tab daily for a total daily dose of 450 mg 90 tablet 0   buPROPion  (WELLBUTRIN  XL) 300 MG 24 hr tablet Take a 300 mg tab and a 150 mg tab daily for a total daily dose of 450 mg 90 tablet 0   busPIRone  (BUSPAR ) 15 MG tablet Take 1 tablet (15 mg total) by mouth 3 (three) times daily. 270 tablet 1   fenofibrate  micronized (LOFIBRA) 134 MG capsule Take 1 capsule (134 mg total) by mouth daily before breakfast. 90 capsule 0   fluticasone  (FLONASE ) 50 MCG/ACT nasal spray Place 2 sprays into both nostrils daily. 18.2 mL 2   gabapentin (NEURONTIN) 300 MG capsule Take 300 mg by mouth 3 (three) times daily.     losartan  (COZAAR ) 50 MG tablet TAKE 1 TABLET BY MOUTH DAILY 90 tablet 1   metoprolol  succinate (TOPROL -XL) 50 MG 24 hr tablet TAKE 1 TABLET BY MOUTH DAILY WITH A MEAL 90 tablet 1   Nebulizer MISC Use w/ nebulized medication q 6 hours 1 each 0   progesterone  (PROMETRIUM ) 100 MG capsule Take 100 mg by mouth at bedtime.     Respiratory Therapy Supplies (NEBULIZER MASK ADULT/TUBING) MISC 1 each by Does not  apply route every 6 (six) hours. Use w/ nebulized med q 6 hours 1 each 0   Spacer/Aero-Holding Chambers (OPTICHAMBER DIAMOND -LG MASK) DEVI Dispense one 1 each 0   albuterol  (PROVENTIL ) (2.5 MG/3ML) 0.083% nebulizer solution Take 3 mLs (2.5 mg total) by nebulization every 6 (six) hours as needed for wheezing or shortness of breath. 360 mL 1   levocetirizine (XYZAL ) 5 MG tablet Take 1 tablet (5 mg total) by mouth every evening. (Patient not taking: Reported on 01/09/2024) 30 tablet 5   doxycycline  (VIBRA -TABS) 100 MG tablet Take 1 tablet (100 mg total) by mouth 2 (two) times daily. (Patient not taking: Reported on 01/09/2024) 14 tablet 0   No facility-administered medications prior to visit.    Review of Systems  Constitutional:  Negative for chills, diaphoresis, fever, malaise/fatigue and weight loss.  HENT:  Negative for congestion.        Hoarseness  Respiratory:  Positive for cough. Negative for hemoptysis, sputum production, shortness of breath and wheezing.   Cardiovascular:  Negative for chest pain (chest tightnees), palpitations and leg swelling.     Objective:   Vitals:   01/09/24 1057  BP: 129/72  Pulse: 72  SpO2: 97%  Weight: 123 lb 12.8 oz (56.2 kg)  Height: 5' 4 (1.626 m)   SpO2: 97 %  Physical Exam: General: Well-appearing, no acute distress HENT: [place], AT, hoarseness Eyes: EOMI, no scleral icterus Respiratory: Clear to auscultation bilaterally.  No crackles, wheezing or rales Cardiovascular: RRR, -M/R/G, no JVD Extremities:-Edema,-tenderness Neuro: AAO x4, CNII-XII grossly intact Psych: Normal mood, normal affect  Data Reviewed:  Imaging: CXR 03/26/21 - No active issues. No hilar adenopathy CTA 05/18/23 - No PE. Normal parenchyma CXR 10/14/23 - No acute cardiopulmonary issues  PFT: 08/19/21 FVC 3.31 (107%) FEV1 2.48 (106%) Ratio 79  TLC 117% RV 139% DLCO 91%.  Interpretation: On Advair. No obstructive or restrictive defect present. No significant BD response  however does not preclude bronchodilator benefit. F-V loops suggestive of minimal small airway disease.   Labs: CBC    Component  Value Date/Time   WBC 4.4 11/17/2023 1551   RBC 4.14 11/17/2023 1551   HGB 13.2 11/17/2023 1551   HCT 41.2 11/17/2023 1551   PLT 307.0 11/17/2023 1551   MCV 99.6 11/17/2023 1551   MCH 32.1 05/18/2023 1413   MCHC 32.1 11/17/2023 1551   RDW 14.2 11/17/2023 1551   LYMPHSABS 1.3 11/17/2023 1551   MONOABS 0.5 11/17/2023 1551   EOSABS 0.1 11/17/2023 1551   BASOSABS 0.0 11/17/2023 1551   Absolute eos 03/26/21 -700    Assessment & Plan:   Discussion: 73 year old female never smoker with sarcoidosis, asthma, HTN, anxiety, morton's neuroma who presents for follow-up. Has hx of prolonged cough in setting of asthma. Doubt active sarcoid which has been quiescent since 1990s. Worsening asthma symptoms on low dose ICS/LABA however this likely contributed to her hoarseness. Discussed bronchodilator plan below with plan with spacer and alternating with nebulizer use. Consider Tezspire in the future if asthma symptoms remain uncontrolled.  Moderate persistent asthma - symptomatic but not in exacerbation START Symbicort  TWO puffs in the morning. Rinse and gargle mouth after use ORDER Spacer to use with Symbicort . Go downstairs to the pharmacy START budesonide  1 nebulizer treatment a night Take mucinex  600 mg daily for 5-7 days Prednisone  taper ordered if cough worsens  Consider Allergy referral to evaluate for eligibility for allergy shots. Message sent to patient if she is interested.  CONTINUE Albuterol  AS NEEDED for shortness of breath or wheezing Unable to tolerate Singulair   Hoarseness May be related to ICS use, UACS, frequent cough --REFER to ENT --Recommend transitioning for neosynephrine nasal spray to flonase . Expect rebound congestion --Spacer ordered for inhaler --Voice rest, drinking warm fluids, cough drops to minimize trauma  Pulmonary  sarcoidosis/monitoring --Dx in 1980s via lung biopsy --Recent chest imaging reviewed.  --Annual PFTs.  Next due 08/2022 --Annual ophthalmology exam. Patient to schedule  --EKG today: NSR  Health Maintenance  Immunization History  Administered Date(s) Administered   DTaP 08/09/2010   Fluad  Quad(high Dose 65+) 01/17/2020, 11/14/2020, 01/13/2022   Fluad  Trivalent(High Dose 65+) 11/02/2022   INFLUENZA, HIGH DOSE SEASONAL PF 04/22/2018, 11/04/2018, 10/27/2023   Influenza Split 02/22/2011   Influenza,inj,Quad PF,6+ Mos 01/30/2014, 03/11/2015, 03/16/2016   PFIZER Comirnaty (Gray Top)Covid-19 Tri-Sucrose Vaccine 05/20/2020   PFIZER(Purple Top)SARS-COV-2 Vaccination 03/15/2019, 04/05/2019, 11/15/2019, 12/05/2019, 10/21/2020   PNEUMOCOCCAL CONJUGATE-20 03/29/2022   Pfizer Covid-19 Vaccine Bivalent Booster 93yrs & up 08/19/2021   Pfizer(Comirnaty )Fall Seasonal Vaccine 12 years and older 11/27/2021, 11/02/2022, 10/27/2023   Pneumococcal Polysaccharide-23 11/04/2018   Tdap 11/04/2018, 08/14/2020, 08/14/2020   Zoster Recombinant(Shingrix ) 08/14/2020, 11/14/2020   Zoster, Live 03/11/2015   Orders Placed This Encounter  Procedures   Ambulatory referral to ENT    Referral Priority:   Routine    Referral Type:   Consultation    Referral Reason:   Specialty Services Required    Requested Specialty:   Otolaryngology    Number of Visits Requested:   1   Meds ordered this encounter  Medications   albuterol  (PROVENTIL ) (2.5 MG/3ML) 0.083% nebulizer solution    Sig: Take 3 mLs (2.5 mg total) by nebulization every 6 (six) hours as needed for wheezing or shortness of breath.    Dispense:  360 mL    Refill:  1   Spacer/Aero-Holding Chambers (OPTICHAMBER DIAMOND -LG MASK) DEVI    Sig: Please use with inhaler    Dispense:  1 each    Refill:  0   budesonide  (PULMICORT ) 0.25 MG/2ML nebulizer solution    Sig:  Take 2 mLs (0.25 mg total) by nebulization daily.    Dispense:  60 mL    Refill:  2    predniSONE  (DELTASONE ) 10 MG tablet    Sig: Take 4 tablets (40 mg total) by mouth daily with breakfast for 2 days, THEN 3 tablets (30 mg total) daily with breakfast for 2 days, THEN 2 tablets (20 mg total) daily with breakfast for 2 days, THEN 1 tablet (10 mg total) daily with breakfast for 2 days.    Dispense:  20 tablet    Refill:  0   Return in about 6 weeks (around 02/20/2024).  I have spent a total time of 30-minutes on the day of the appointment including chart review, data review, collecting history, coordinating care and discussing medical diagnosis and plan with the patient/family. Past medical history, allergies, medications were reviewed. Pertinent imaging, labs and tests included in this note have been reviewed and interpreted independently by me.  Galan Ghee Slater Staff, MD [facility] 01/09/2024 12:08 PM

## 2024-01-09 NOTE — Patient Instructions (Addendum)
 Moderate persistent asthma - symptomatic but not in exacerbation START Symbicort  TWO puffs in the morning. Rinse and gargle mouth after use ORDER Spacer to use with Symbicort . Go downstairs to the pharmacy START budesonide  1 nebulizer treatment a night Take mucinex  600 mg daily for 5-7 days  Hoarseness May be related to ICS use, UACS, frequent cough --REFER to ENT --Recommend transitioning for neosynephrine nasal spray to flonase . Expect rebound congestion --Spacer ordered for inhaler --Voice rest, drinking warm fluids, cough drops to minimize trauma

## 2024-01-10 NOTE — Progress Notes (Signed)
 Received notification from [employer] regarding a prior authorization for TEZSPIRE. Authorization has been APPROVED - result viewed in CMM. Awaiting approval letter.   Awaiting communication from Dr. Kassie if therapy with Tezspire is to be initiated.   Key: [id number]

## 2024-01-25 ENCOUNTER — Ambulatory Visit: Payer: Self-pay

## 2024-01-25 NOTE — Telephone Encounter (Signed)
 FYI Only or Action Required?: FYI only for provider: appointment scheduled on 01/26/24.  Patient was last seen in primary care on 08/11/2023 by Wheeler Harlene CROME, NP.  Called Nurse Triage reporting Joint Pain.  Symptoms began several months ago.  Interventions attempted: OTC medications: Acetaminophen  and Rest, hydration, or home remedies.  Symptoms are: gradually worsening.  Triage Disposition: See PCP When Office is Open (Within 3 Days)  Patient/caregiver understands and will follow disposition?: Yes  Reason for Disposition  [1] MODERATE pain (e.g., interferes with normal activities, limping) AND [2] present > 3 days  Answer Assessment - Initial Assessment Questions Patient states that she has been dealing with joint pain for a long time, but she is experiencing a lot of pain and stiffness in her hips and knees that is making it very difficult for her to walk. She states that pain is mild during time of call because she is laying down, but when she is moving around it worsens. She has been taking Tylenol , but it does not help at all. She states that she has been seen in the office previously for this, but feels she really needs to see an Orthopedic doctor.   1. LOCATION and RADIATION: Where is the pain located? Does the pain spread (shoot) anywhere else?     Hips and knees  2. QUALITY: What does the pain feel like?  (e.g., sharp, dull, aching, burning)     Aching  3. SEVERITY: How bad is the pain? What does it keep you from doing?   (Scale 1-10; or mild, moderate, severe)     Mild during time of call while patient is resting, but severe when trying to do activities  4. ONSET: When did the pain start? Does it come and go, or is it there all the time?     Chronic pain  5. WORK OR EXERCISE: Has there been any recent work or exercise that involved this part of the body?      No  6. CAUSE: What do you think is causing the hip pain?      Possibly arthritis  7.  AGGRAVATING FACTORS: What makes the hip pain worse? (e.g., walking, climbing stairs, running)     Walking  8. OTHER SYMPTOMS: Do you have any other symptoms? (e.g., back pain, pain shooting down leg,  fever, rash)     Denies any other symptoms  Protocols used: Hip Pain-A-AH  Copied from CRM #[id number]. Topic: Clinical - Red Word Triage >> Jan 25, 2024 10:36 AM Robinson H wrote: [facility] that prompted transfer to Nurse Triage: Having a lot of pain and having issues walking, last night was really bad couldn't go upstairs, joints around hips really hurts, can't bend legs easily, has had some mobility issues but getting worse.

## 2024-01-25 NOTE — Telephone Encounter (Signed)
 Appt scheduled

## 2024-01-26 ENCOUNTER — Encounter: Payer: Self-pay | Admitting: Family

## 2024-01-26 ENCOUNTER — Ambulatory Visit (HOSPITAL_BASED_OUTPATIENT_CLINIC_OR_DEPARTMENT_OTHER)
Admission: RE | Admit: 2024-01-26 | Discharge: 2024-01-26 | Disposition: A | Source: Ambulatory Visit | Attending: Family | Admitting: Family

## 2024-01-26 ENCOUNTER — Ambulatory Visit: Admitting: Family

## 2024-01-26 VITALS — BP 126/80 | HR 63 | Temp 98.3°F | Resp 14 | Ht 64.0 in | Wt 123.0 lb

## 2024-01-26 DIAGNOSIS — M545 Low back pain, unspecified: Secondary | ICD-10-CM

## 2024-01-26 DIAGNOSIS — M5416 Radiculopathy, lumbar region: Secondary | ICD-10-CM | POA: Diagnosis present

## 2024-01-26 MED ORDER — METHYLPREDNISOLONE 4 MG PO TBPK: ORAL_TABLET | ORAL | 0 refills | Status: AC

## 2024-01-26 MED ORDER — NEOMYCIN-POLYMYXIN-HC 3.5-10000-1 OP SUSP: 3.0000 [drp] | Freq: Three times a day (TID) | OPHTHALMIC | 0 refills | Status: DC

## 2024-01-26 NOTE — Progress Notes (Signed)
 Acute Office Visit  Subjective:     Patient ID: Casey Rowe, female    DOB: 1951-02-02, 73 y.o.   MRN: [id number]  Chief Complaint  Patient presents with   Leg Pain    Patient is here for leg stiffness and lower back pain, present for 1 month    HPI Patient is in today with concerns of low back pain, stiffness x 1 month.  Patient reports that she feels she may have hurt her lower back working out at [employer] about a month ago.  Since that time, she has had low back pain and stiffness.  The pain is a 5 out of 10.  Has not taken any medications for it.  The pain is especially worse when she tries to go up steps.  She denies any trouble with her bowels or bladder.  Review of Systems  Musculoskeletal:  Positive for back pain.       Stiffness in the lower back  Neurological: Negative.   All other systems reviewed and are negative.  Past Medical History:  Diagnosis Date   Abdominal pain 08/08/2020   Acute nonsuppurative otitis media of left ear 12/22/2014   Allergy    Anorexia 01/14/2021   600 calories/daily   Asthma exacerbation 02/22/2012   Broken jaw    1980s; fall down flight of stairs   Callus of foot 12/28/2017   Coccydynia 04/08/2020   Concussion 06/2022   Fall at home    Conjunctivitis 04/04/2022   Fall 03/27/2017   At work on November 11, 2016, following with workman's comp  Last Assessment & Plan:   Clemens at work on 11/09/16 and has been following with Bj's comp. She   fell up some steps onto her knees and has been left with chronic pain.     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   Left hip pain 01/21/2020   Loss of appetite 01/14/2021   Macrocytic anemia 02/15/2022   Metatarsalgia of both  feet 12/28/2017   Mild cognitive impairment with memory loss 11/02/2022   Moderate persistent asthma without complication 06/11/2021   Morton's neuroma of both feet 03/27/2017   Palpitation 03/24/2017   Rosacea 03/17/2020   Sarcoidosis    Sinusitis 01/18/2011   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   Vitamin B 12 deficiency 06/23/2022   Weight loss 11/07/2013    Social History   Socioeconomic History   Marital status: Married    Spouse name: Not on file   Number of children: Not on file   Years of education: 18   Highest education level: Master's degree (e.g., MA, MS, MEng, MEd, MSW, MBA)  Occupational History   Occupation: Retired  Tobacco Use   Smoking status: Never   Smokeless tobacco: Never  Vaping Use   Vaping status: Never Used  Substance and Sexual Activity   Alcohol use: Yes    Alcohol/week: 14.0 - 21.0 standard drinks of alcohol    Types: 14 - 21 Glasses of wine per week    Comment: couple glasses of wine a lot of nights   Drug use: No   Sexual activity: Yes  Other Topics Concern   Not on file  Social History Narrative   Works as a runner, broadcasting/film/video  at [employer] [place]MarriedGrown children- 3   Master in science    One floor home   Right handed   Lives with husband   Retired   Drinks caffeine prn   Social Drivers of Health   Tobacco Use: Low Risk (01/26/2024)   Patient History    Smoking Tobacco Use: Never    Smokeless Tobacco Use: Never    Passive Exposure: Not on file  Financial Resource Strain: Medium Risk (09/06/2023)   Overall Financial Resource Strain (CARDIA)    Difficulty of Paying Living Expenses: Somewhat hard  Food Insecurity: No Food Insecurity (09/07/2023)   Epic    Worried About Programme Researcher, Broadcasting/film/video in the Last Year: Never true    Ran Out of Food in the Last Year: Never true  Transportation Needs: No Transportation Needs (09/06/2023)   Epic    Lack of Transportation (Medical): No    Lack of  Transportation (Non-Medical): No  Physical Activity: Insufficiently Active (09/06/2023)   Exercise Vital Sign    Days of Exercise per Week: 3 days    Minutes of Exercise per Session: 40 min  Stress: No Stress Concern Present (09/06/2023)   [employer] of Occupational Health - Occupational Stress Questionnaire    Feeling of Stress: Not at all  Social Connections: Socially Isolated (09/06/2023)   Social Connection and Isolation Panel    Frequency of Communication with Friends and Family: Once a week    Frequency of Social Gatherings with Friends and Family: Never    Attends Religious Services: Never    Database Administrator or Organizations: No    Attends Banker Meetings: Patient unable to answer    Marital Status: Married  Catering Manager Violence: Not At Risk (09/07/2023)   Epic    Fear of Current or Ex-Partner: No    Emotionally Abused: No    Physically Abused: No    Sexually Abused: No  Depression (PHQ2-9): Low Risk (09/07/2023)   Depression (PHQ2-9)    PHQ-2 Score: 0  Alcohol Screen: Low Risk (09/06/2023)   Alcohol Screen    Last Alcohol Screening Score (AUDIT): 3  Housing: Low Risk (09/06/2023)   Epic    Unable to Pay for Housing in the Last Year: No    Number of Times Moved in the Last Year: 0    Homeless in the Last Year: No  Utilities: Not At Risk (09/07/2023)   Epic    Threatened with loss of utilities: No  Health Literacy: Adequate Health Literacy (09/07/2023)   B1300 Health Literacy    Frequency of need for help with medical instructions: Never    Past Surgical History:  Procedure Laterality Date   ABDOMINAL HYSTERECTOMY     CESAREAN SECTION     ECTOPIC PREGNANCY SURGERY  1981   FRACTURE SURGERY  1980s   broken jaw    Family History  Problem Relation Age of Onset   Hypertension Mother    Dementia Mother        multi-infarct   Diabetes Mother    Diabetes Father    Heart attack Father    Heart disease Father     Psoriasis Father    Cancer Sister        pancreatic   Alcohol abuse Sister        substance   Hepatitis B Brother    Gout Brother    Hypertension Brother    Glaucoma Brother    Heart disease Brother    Rashes /  Skin problems Son    Other Son        bad allergies, aspirin, dermatitis   Rashes / Skin problems Son    Cancer Maternal Aunt        breast   Breast cancer Maternal Aunt     Allergies[1]  Medications Ordered Prior to Encounter[2]  BP 126/80 (BP Location: Left Arm, Patient Position: Sitting, Cuff Size: Normal)   Pulse 63   Temp 98.3 F (36.8 C) (Oral)   Resp 14   Ht 5' 4 (1.626 m)   Wt 123 lb (55.8 kg)   LMP 02/08/1997   SpO2 99%   BMI 21.11 kg/m chart      Objective:    BP 126/80 (BP Location: Left Arm, Patient Position: Sitting, Cuff Size: Normal)   Pulse 63   Temp 98.3 F (36.8 C) (Oral)   Resp 14   Ht 5' 4 (1.626 m)   Wt 123 lb (55.8 kg)   LMP 02/08/1997   SpO2 99%   BMI 21.11 kg/m    Physical Exam Vitals and nursing note reviewed.  Constitutional:      Appearance: Normal appearance. She is normal weight.  Cardiovascular:     Rate and Rhythm: Normal rate and regular rhythm.  Pulmonary:     Effort: Pulmonary effort is normal.     Breath sounds: Normal breath sounds.  Musculoskeletal:     Cervical back: Normal range of motion and neck supple.     Comments: Positive straight leg raise maneuver.  Pain elicited at about 75 degrees flexion.  No pain with rotation.  Mild pain to palpation of the lower back  Skin:    General: Skin is warm and dry.  Neurological:     General: No focal deficit present.     Mental Status: She is alert and oriented to person, place, and time. Mental status is at baseline.  Psychiatric:        Mood and Affect: Mood normal.        Behavior: Behavior normal.        Thought Content: Thought content normal.        Judgment: Judgment normal.    No results found for any visits on 01/26/24.       Assessment & Plan:   Problem List Items Addressed This Visit   None Visit Diagnoses       Lumbar radiculopathy    -  Primary   Relevant Medications   cyclobenzaprine  (FLEXERIL ) 5 MG tablet   Other Relevant Orders   DG Lumbar Spine Complete     Acute right-sided low back pain without sciatica       Relevant Medications   methylPREDNISolone  (MEDROL  DOSEPAK) 4 MG TBPK tablet   cyclobenzaprine  (FLEXERIL ) 5 MG tablet   Other Relevant Orders   DG Lumbar Spine Complete       Meds ordered this encounter  Medications   methylPREDNISolone  (MEDROL  DOSEPAK) 4 MG TBPK tablet    Sig: As directed    Dispense:  21 tablet    Refill:  0   cyclobenzaprine  (FLEXERIL ) 5 MG tablet    Sig: Take 1 tablet (5 mg total) by mouth 3 (three) times daily as needed for muscle spasms.    Dispense:  30 tablet    Refill:  1   DISCONTD: neomycin -polymyxin-hydrocortisone (CORTISPORIN) 3.5-10000-1 ophthalmic suspension    Sig: Place 3 drops into the left eye 3 (three) times daily.    Dispense:  7.5 mL  Refill:  0   X-ray obtained today will notify patient pending results.  Will treat with a Medrol  Dosepak to help with inflammation and as needed Flexeril  to help with the pain and help her get some rest at night.  Call the office if symptoms worsen or persist.  Will follow-up pending the results of the x-ray and discuss further treatment plan thereafter. No follow-ups on file.  Donnamaria Shands B Shiva Karis, FNP       [1] Allergies Allergen Reactions   Demerol Nausea And Vomiting   Singulair  [Montelukast ]     Dry mouth/dehydration   Augmentin  [Amoxicillin -Pot Clavulanate] Diarrhea    Caused Diarrhea  [2] Current Outpatient Medications on File Prior to Visit  Medication Sig Dispense Refill   albuterol  (PROVENTIL ) (2.5 MG/3ML) 0.083% nebulizer solution Take 3 mLs (2.5 mg total) by nebulization every 6 (six) hours as needed for wheezing or shortness of breath. 360 mL 1   albuterol  (VENTOLIN  HFA) 108 (90  Base) MCG/ACT inhaler Inhale 1-2 puffs into the lungs every 4 (four) hours as needed for wheezing or shortness of breath. 18 g 5   budesonide  (PULMICORT ) 0.25 MG/2ML nebulizer solution Take 2 mLs (0.25 mg total) by nebulization daily. 60 mL 2   budesonide -formoterol  (SYMBICORT ) 160-4.5 MCG/ACT inhaler Inhale 2 puffs into the lungs in the morning and at bedtime. 1 each 6   buPROPion  (WELLBUTRIN  XL) 150 MG 24 hr tablet Take a 300 mg tab and a 150 mg tab daily for a total daily dose of 450 mg 90 tablet 0   buPROPion  (WELLBUTRIN  XL) 300 MG 24 hr tablet Take a 300 mg tab and a 150 mg tab daily for a total daily dose of 450 mg 90 tablet 0   busPIRone  (BUSPAR ) 15 MG tablet Take 1 tablet (15 mg total) by mouth 3 (three) times daily. 270 tablet 1   fenofibrate  micronized (LOFIBRA) 134 MG capsule Take 1 capsule (134 mg total) by mouth daily before breakfast. 90 capsule 0   fluticasone  (FLONASE ) 50 MCG/ACT nasal spray Place 2 sprays into both nostrils daily. 18.2 mL 2   gabapentin (NEURONTIN) 300 MG capsule Take 300 mg by mouth 3 (three) times daily.     levocetirizine (XYZAL ) 5 MG tablet Take 1 tablet (5 mg total) by mouth every evening. 30 tablet 5   losartan  (COZAAR ) 50 MG tablet TAKE 1 TABLET BY MOUTH DAILY 90 tablet 1   metoprolol  succinate (TOPROL -XL) 50 MG 24 hr tablet TAKE 1 TABLET BY MOUTH DAILY WITH A MEAL 90 tablet 1   Nebulizer MISC Use w/ nebulized medication q 6 hours 1 each 0   progesterone  (PROMETRIUM ) 100 MG capsule Take 100 mg by mouth at bedtime.     Respiratory Therapy Supplies (NEBULIZER MASK ADULT/TUBING) MISC 1 each by Does not apply route every 6 (six) hours. Use w/ nebulized med q 6 hours 1 each 0   Spacer/Aero-Holding Chambers (OPTICHAMBER DIAMOND -LG MASK) DEVI Dispense one 1 each 0   Spacer/Aero-Holding Chambers (OPTICHAMBER DIAMOND -LG MASK) DEVI Please use with inhaler 1 each 0   No current facility-administered medications on file prior to visit.

## 2024-02-11 ENCOUNTER — Ambulatory Visit: Payer: Self-pay | Admitting: Family

## 2024-02-13 NOTE — Progress Notes (Signed)
 Called pt and no answer left message to return call.

## 2024-02-17 NOTE — Progress Notes (Signed)
 Called pt and no answer left message to return call. Also results were send to pt in the mail.

## 2024-02-27 ENCOUNTER — Other Ambulatory Visit: Payer: Self-pay | Admitting: Family Medicine

## 2024-02-28 ENCOUNTER — Other Ambulatory Visit: Payer: Self-pay | Admitting: Family Medicine

## 2024-02-29 ENCOUNTER — Ambulatory Visit (HOSPITAL_BASED_OUTPATIENT_CLINIC_OR_DEPARTMENT_OTHER): Admitting: Pulmonary Disease

## 2024-02-29 ENCOUNTER — Encounter (HOSPITAL_BASED_OUTPATIENT_CLINIC_OR_DEPARTMENT_OTHER): Payer: Self-pay | Admitting: Pulmonary Disease

## 2024-02-29 ENCOUNTER — Other Ambulatory Visit (HOSPITAL_BASED_OUTPATIENT_CLINIC_OR_DEPARTMENT_OTHER): Payer: Self-pay

## 2024-02-29 DIAGNOSIS — J4540 Moderate persistent asthma, uncomplicated: Secondary | ICD-10-CM

## 2024-02-29 DIAGNOSIS — J4550 Severe persistent asthma, uncomplicated: Secondary | ICD-10-CM

## 2024-02-29 DIAGNOSIS — R490 Dysphonia: Secondary | ICD-10-CM | POA: Diagnosis not present

## 2024-02-29 DIAGNOSIS — D860 Sarcoidosis of lung: Secondary | ICD-10-CM

## 2024-02-29 DIAGNOSIS — T7840XA Allergy, unspecified, initial encounter: Secondary | ICD-10-CM

## 2024-02-29 MED ORDER — OPTICHAMBER DIAMOND-LG MASK DEVI: 0 refills | Status: AC

## 2024-02-29 NOTE — Patient Instructions (Addendum)
 Moderate persistent asthma  CONTINUE Symbicort  TWO puff in the morning. Rinse and gargle mouth after use RE-ORDER spacer to use with Symbicort . Go downstairs to the pharmacy CONTINUE budesonide  1 nebulizer treatment a night CONTINUE Albuterol  AS NEEDED for shortness of breath or wheezing Unable to tolerate Singulair  REFER to Allergy to evaluate for eligibility for allergy shots.  START Tezpire shots. You will receive a phone call regarding the injections  Hoarseness --Provided ENT information to patient for scheduling

## 2024-02-29 NOTE — Progress Notes (Signed)
 "   Subjective:   PATIENT ID: Casey Rowe GENDER: female DOB: August 14, 1950, MRN: [id number]   HPI  Chief Complaint  Patient presents with   Sarcoidosis    Reason for Visit: Follow-up  Casey Rowe is a 74 year old female never smoker with sarcoidosis, asthma, HTN, anxiety, morton's neuroma who for follow-up.  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 On PRN low-dose Advair for her asthma. She reports chronic cough that begin in October 2022. Has worsened in the last 2.5 months. She was diagnosed with COVID, Kraken variant, in Jan 2023. She had had persistent productive cough. Sputum production has improved Tried tessalon  perles. She was seen in [facility] ED for wheezing, cough, and shortness of breath for asthma exacerbation in Feb 2023.  Heat, laying on her back will worsen her cough. Robitussin improves her cough. Denies chest tightness. Works out 3-4 times a week on cardio machine. In the last year she reports 20 lb of unintentional weight loss. Thyroid  levels normal. Previously evaluated for diarrhea.  06/11/21 Since our last visit she was treated for asthma exacerbation with prednisone  and prescribed Advair. With the steroid, she reports productive coughing and wheezing resolved. Shortness of breath has improved. Has to use robitussin frequently. Weight has been stabilizing. She reports occasional episodes of diaphoresis a few weeks ago. State her diet could be better  08/19/21 Cough improves with daily Advair use. Does have some hoarseness. She rinses mouth out. Continues to have diarrhea. Weight is stable between 116-119 lbs. She eats two meals a day and her husband reminds her to eat. She has no appetite.  01/04/22 Recently fell on her knee and having difficulty ambulating. Her cough is fairly controlled on Advair 1-2 times daily. Has to use albuterol  2-3 times a day to stop coughing fits. The advair does cause  her to be hoarse but she feels it is effective. Denies exacerbations requiring steroids or antibiotics since last visit. In general she is very active, going to the gym 4-5 days a week so she is frustrated with her injury. She will see her PCP for evaluation for this. Denies shortness of breath or wheezing.  06/06/23 Last visit in 2023 with me. She recently seen with Dr. Alva on 03/17/23 for chronic cough suspected to be post-viral bronchitis worsened by underlying asthma on scheduled nebulizers. Was prescribed prolonged course of prednisone  for two weeks. Also seen in the ED on 05/18/23 for presumed bronchitis with negative CXR and negative CTA 05/18/23 for pulmonary embolism and treated Duoneb.  She currently has not been needing her nebulizer for the last 5 days. No longer having nocturnal symptoms. She is using Advair diskus but only using it 2-3 days a week and only once a day. Does report heat triggers her symptoms.   01/09/24 Since our last visit she has had multiple exacerbations over the summer. Has been compliant with Symbicort  and felt improved while on prednisone  tapers. However has lost her voice since August, four months ago. Unable to titrate down to lower dose Symbicort  due to uncontrolled asthma. She never picked up spacer. She reports her chest tightness is still an issue. She using albuterol  nebulizer at least twice a day which helps with the chest tightness. Cough has improved with no issues two weeks ago. Reports sinus drainage but is improved. Cough is nonproductive but is congested. Over the summer biologic was considered however no peripheral eosinophilia.  02/29/24 Since our last  visit she is using nebulizer daily and Symbicort  as needed. Has been using Symbicort  PRN at least three times a week for shortness of breath described as difficulty taking a breath in. She states she has not received any calls for ENT referral however documentation from ENT office reports multiple attempts before  closing referral.   Social History: Never smoker Retired Retail buyer. Currently subbing  Past Medical History:  Diagnosis Date   Abdominal pain 08/08/2020   Acute nonsuppurative otitis media of left ear 12/22/2014   Allergy    Anorexia 01/14/2021   600 calories/daily   Asthma exacerbation 02/22/2012   Broken jaw    1980s; fall down flight of stairs   Callus of foot 12/28/2017   Coccydynia 04/08/2020   Concussion 06/2022   Fall at home    Conjunctivitis 04/04/2022   Fall 03/27/2017   At work on November 11, 2016, following with workman's comp  Last Assessment & Plan:   Clemens at work on 11/09/16 and has been following with Bj's comp. She   fell up some steps onto her knees and has been left with chronic pain.     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   Palpitation 03/24/2017   Rosacea 03/17/2020   Sarcoidosis    Sinusitis 01/18/2011   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   Vitamin B 12 deficiency 06/23/2022   Weight loss 11/07/2013     Family History  Problem Relation Age of Onset   Hypertension Mother    Dementia Mother        multi-infarct   Diabetes Mother    Diabetes Father    Heart attack Father    Heart disease Father    Psoriasis Father    Cancer Sister        pancreatic   Alcohol abuse Sister        substance   Hepatitis B Brother    Gout Brother    Hypertension Brother    Glaucoma Brother    Heart  disease Brother    Rashes / Skin problems Son    Other Son        bad allergies, aspirin, dermatitis   Rashes / Skin problems Son    Cancer Maternal Aunt        breast   Breast cancer Maternal Aunt      Social History   Occupational History   Occupation: Retired  Tobacco Use   Smoking status: Never   Smokeless tobacco: Never  Vaping Use   Vaping status: Never Used  Substance and Sexual Activity   Alcohol use: Yes    Alcohol/week: 14.0 - 21.0 standard drinks of alcohol    Types: 14 - 21 Glasses of wine per week    Comment: couple glasses of wine a lot of nights   Drug use: No   Sexual activity: Yes    Allergies  Allergen Reactions   Demerol Nausea And Vomiting   Singulair  [Montelukast ]  Dry mouth/dehydration   Augmentin  [Amoxicillin -Pot Clavulanate] Diarrhea    Caused Diarrhea     Outpatient Medications Prior to Visit  Medication Sig Dispense Refill   albuterol  (PROVENTIL ) (2.5 MG/3ML) 0.083% nebulizer solution Take 3 mLs (2.5 mg total) by nebulization every 6 (six) hours as needed for wheezing or shortness of breath. 360 mL 1   albuterol  (VENTOLIN  HFA) 108 (90 Base) MCG/ACT inhaler Inhale 1-2 puffs into the lungs every 4 (four) hours as needed for wheezing or shortness of breath. 18 g 5   budesonide  (PULMICORT ) 0.25 MG/2ML nebulizer solution Take 2 mLs (0.25 mg total) by nebulization daily. 60 mL 2   budesonide -formoterol  (SYMBICORT ) 160-4.5 MCG/ACT inhaler Inhale 2 puffs into the lungs in the morning and at bedtime. 1 each 6   buPROPion  (WELLBUTRIN  XL) 150 MG 24 hr tablet Take a 300 mg tab and a 150 mg tab daily for a total daily dose of 450 mg 90 tablet 0   buPROPion  (WELLBUTRIN  XL) 300 MG 24 hr tablet Take a 300 mg tab and a 150 mg tab daily for a total daily dose of 450 mg 90 tablet 0   busPIRone  (BUSPAR ) 15 MG tablet Take 1 tablet (15 mg total) by mouth 3 (three) times daily. 270 tablet 1   cyclobenzaprine  (FLEXERIL ) 5 MG tablet Take 1 tablet (5 mg total) by  mouth 3 (three) times daily as needed for muscle spasms. 30 tablet 1   fenofibrate  micronized (LOFIBRA) 134 MG capsule TAKE 1 CAPSULE BY MOUTH DAILY BEFORE BREAKFAST 90 capsule 1   fluticasone  (FLONASE ) 50 MCG/ACT nasal spray Place 2 sprays into both nostrils daily. 18.2 mL 2   gabapentin (NEURONTIN) 300 MG capsule Take 300 mg by mouth 3 (three) times daily.     levocetirizine (XYZAL ) 5 MG tablet Take 1 tablet (5 mg total) by mouth every evening. 30 tablet 5   losartan  (COZAAR ) 50 MG tablet TAKE 1 TABLET BY MOUTH DAILY 90 tablet 1   metoprolol  succinate (TOPROL -XL) 50 MG 24 hr tablet TAKE 1 TABLET BY MOUTH DAILY WITH A MEAL 90 tablet 1   Nebulizer MISC Use w/ nebulized medication q 6 hours 1 each 0   progesterone  (PROMETRIUM ) 100 MG capsule Take 100 mg by mouth at bedtime.     Respiratory Therapy Supplies (NEBULIZER MASK ADULT/TUBING) MISC 1 each by Does not apply route every 6 (six) hours. Use w/ nebulized med q 6 hours 1 each 0   Spacer/Aero-Holding Chambers (OPTICHAMBER DIAMOND -LG MASK) DEVI Dispense one 1 each 0   Spacer/Aero-Holding Chambers (OPTICHAMBER DIAMOND -LG MASK) DEVI Please use with inhaler 1 each 0   methylPREDNISolone  (MEDROL  DOSEPAK) 4 MG TBPK tablet As directed (Patient not taking: Reported on 02/29/2024) 21 tablet 0   No facility-administered medications prior to visit.    Review of Systems  Constitutional:  Negative for chills, diaphoresis, fever, malaise/fatigue and weight loss.  HENT:  Negative for congestion.        Hoarseness   Respiratory:  Positive for shortness of breath. Negative for cough, hemoptysis, sputum production and wheezing.   Cardiovascular:  Negative for chest pain (chest tightness), palpitations and leg swelling.     Objective:   There were no vitals filed for this visit.     Physical Exam: General: Well-appearing, no acute distress HENT: [place], AT Eyes: EOMI, no scleral icterus Respiratory: Clear to auscultation bilaterally.  No crackles,  wheezing or rales Cardiovascular: RRR, -M/R/G, no JVD Extremities:-Edema,-tenderness Neuro: AAO x4, CNII-XII grossly intact Psych: Normal mood, normal  affect  Data Reviewed:  Imaging: CXR 03/26/21 - No active issues. No hilar adenopathy CTA 05/18/23 - No PE. Normal parenchyma CXR 10/14/23 - No acute cardiopulmonary issues  PFT: 08/19/21 FVC 3.31 (107%) FEV1 2.48 (106%) Ratio 79  TLC 117% RV 139% DLCO 91%.  Interpretation: On Advair. No obstructive or restrictive defect present. No significant BD response however does not preclude bronchodilator benefit. F-V loops suggestive of minimal small airway disease.   Labs: CBC    Component Value Date/Time   WBC 4.4 11/17/2023 1551   RBC 4.14 11/17/2023 1551   HGB 13.2 11/17/2023 1551   HCT 41.2 11/17/2023 1551   PLT 307.0 11/17/2023 1551   MCV 99.6 11/17/2023 1551   MCH 32.1 05/18/2023 1413   MCHC 32.1 11/17/2023 1551   RDW 14.2 11/17/2023 1551   LYMPHSABS 1.3 11/17/2023 1551   MONOABS 0.5 11/17/2023 1551   EOSABS 0.1 11/17/2023 1551   BASOSABS 0.0 11/17/2023 1551   Absolute eos 03/26/21 -700    Assessment & Plan:   Discussion: 74 year old female never smoker with sarcoidosis in remission, asthma, HTN, anxiety, morton's neuroma who presents for follow-up for asthma. Uncontrolled asthma on daily ICS nebulizer. Intolerance due to ICS/LABA due to hoarseness. Has not tried spacer despite discussing and ordering on our last visit. Due to her limited options for asthma management, I believe optimizing her Allergy status and starting biologic would be the next reasonable step in her care. Spent >50% of visit counseling on management plan as noted below. Although she expresses understanding initially, health literacy may be a barrier as plan had to be re-iterated. Print out of plan provided.  Moderate persistent asthma - symptomatic but not in exacerbation CONTINUE Symbicort  TWO puff in the morning. Rinse and gargle mouth after use RE-ORDER  spacer to use with Symbicort . Go downstairs to the pharmacy  Addendum: Notified by pharmacy team on same day that patient declined spacer again CONTINUE budesonide  1 nebulizer treatment a night CONTINUE Albuterol  AS NEEDED for shortness of breath or wheezing Unable to tolerate Singulair  REFER to Allergy to evaluate for eligibility for allergy shots.  START Tezpire shots. You will receive a phone call regarding the injections  Hoarseness May be related to ICS use, UACS, frequent cough --Provided ENT information to patient for scheduling --Recommend transitioning for neosynephrine nasal spray to flonase . Expect rebound congestion --Spacer ordered for inhaler --Voice rest, drinking warm fluids, cough drops to minimize trauma  Pulmonary sarcoidosis/monitoring --Dx in 1980s via lung biopsy --Recent chest imaging reviewed.  --Annual PFTs.  Overdue --Annual ophthalmology exam. Patient to schedule   Health Maintenance  Immunization History  Administered Date(s) Administered   DTaP 08/09/2010   Fluad  Quad(high Dose 65+) 01/17/2020, 11/14/2020, 01/13/2022   Fluad  Trivalent(High Dose 65+) 11/02/2022   INFLUENZA, HIGH DOSE SEASONAL PF 04/22/2018, 11/04/2018, 10/27/2023   Influenza Split 02/22/2011   Influenza,inj,Quad PF,6+ Mos 01/30/2014, 03/11/2015, 03/16/2016   PFIZER Comirnaty (Gray Top)Covid-19 Tri-Sucrose Vaccine 05/20/2020   PFIZER(Purple Top)SARS-COV-2 Vaccination 03/15/2019, 04/05/2019, 11/15/2019, 12/05/2019, 10/21/2020   PNEUMOCOCCAL CONJUGATE-20 03/29/2022   Pfizer Covid-19 Vaccine Bivalent Booster 46yrs & up 08/19/2021   Pfizer(Comirnaty )Fall Seasonal Vaccine 12 years and older 11/27/2021, 11/02/2022, 10/27/2023   Pneumococcal Polysaccharide-23 11/04/2018   Tdap 11/04/2018, 08/14/2020, 08/14/2020   Zoster Recombinant(Shingrix ) 08/14/2020, 11/14/2020   Zoster, Live 03/11/2015   Orders Placed This Encounter  Procedures   Ambulatory referral to Allergy    Referral Priority:    Routine    Referral Type:   Allergy Testing  Referral Reason:   Specialty Services Required    Requested Specialty:   Allergy    Number of Visits Requested:   1   Meds ordered this encounter  Medications   Spacer/Aero-Holding Chambers (OPTICHAMBER DIAMOND -LG MASK) DEVI    Sig: Dispense one. Please demonstrate how to use    Dispense:  1 each    Refill:  0   Return in about 3 months (around 05/29/2024).  I have spent a total time of 45-minutes on the day of the appointment including chart review, data review, collecting history, coordinating care and discussing medical diagnosis and plan with the patient/family. Past medical history, allergies, medications were reviewed. Pertinent imaging, labs and tests included in this note have been reviewed and interpreted independently by me.  Nekoda Chock Slater Staff, MD [facility] 02/29/2024 10:58 AM    "

## 2024-03-05 ENCOUNTER — Encounter (HOSPITAL_BASED_OUTPATIENT_CLINIC_OR_DEPARTMENT_OTHER): Payer: Self-pay | Admitting: Pulmonary Disease

## 2024-03-06 ENCOUNTER — Ambulatory Visit: Admitting: Physician Assistant

## 2024-03-07 ENCOUNTER — Telehealth: Payer: Self-pay

## 2024-03-07 NOTE — Telephone Encounter (Signed)
 Received CC chart from Dr. Kassie with request for Tezspire:   Please enroll patient in [place]. Will need inperson visit with pharmacy to sign forms and to discuss as health literacy may be a barrier. May be best to have patient and maybe family scheduled to come in for injections the first few times.  ////  Previous PA for Tezspire submitted on 01/10/24   Received notification from [facility] regarding a prior authorization for TEZSPIRE. Authorization has been APPROVED through 02/07/25. Approval letter is in media tab.   Key: [id number]  /////  Test claim $100. No grants are open at this time.   Attempted to call patient to discuss cost and affordability options. Left detailed, HIPAA compliant VM at [phone] requesting return call to my office number at [phone].  The most efficient way to get medication will be if an asthma grant opens up. Otherwise, will need to pursue patient assistance but it is unclear if she would qualify with copay $100.

## 2024-03-09 ENCOUNTER — Other Ambulatory Visit: Payer: Self-pay

## 2024-03-09 DIAGNOSIS — F411 Generalized anxiety disorder: Secondary | ICD-10-CM

## 2024-03-12 NOTE — Telephone Encounter (Signed)
 Attempted to call patient. Mychart message had been sent on 03/07/24 regarding insurance approval for [employer]. Message has not been read  I called patient and call went straight to voicemail. Left message regarding approval of medication and to please check mychart to contact the pharmacy team.

## 2024-05-29 ENCOUNTER — Ambulatory Visit (HOSPITAL_BASED_OUTPATIENT_CLINIC_OR_DEPARTMENT_OTHER): Admitting: Pulmonary Disease

## 2024-09-12 ENCOUNTER — Ambulatory Visit
